# Patient Record
Sex: Female | Born: 1955 | Race: Black or African American | Hispanic: No | Smoking: Never smoker
Health system: Southern US, Community
[De-identification: ages and names within clinical notes are randomized; demographics above are authoritative.]

## PROBLEM LIST (undated history)

## (undated) DIAGNOSIS — T7840XA Allergy, unspecified, initial encounter: Secondary | ICD-10-CM

## (undated) DIAGNOSIS — H579 Unspecified disorder of eye and adnexa: Secondary | ICD-10-CM

## (undated) DIAGNOSIS — K219 Gastro-esophageal reflux disease without esophagitis: Secondary | ICD-10-CM

## (undated) DIAGNOSIS — IMO0002 Reserved for concepts with insufficient information to code with codable children: Secondary | ICD-10-CM

## (undated) DIAGNOSIS — I10 Essential (primary) hypertension: Secondary | ICD-10-CM

## (undated) DIAGNOSIS — D259 Leiomyoma of uterus, unspecified: Secondary | ICD-10-CM

## (undated) DIAGNOSIS — M329 Systemic lupus erythematosus, unspecified: Secondary | ICD-10-CM

## (undated) HISTORY — DX: Unspecified disorder of eye and adnexa: H57.9

## (undated) HISTORY — DX: Leiomyoma of uterus, unspecified: D25.9

## (undated) HISTORY — PX: BREAST CYST EXCISION: SHX579

## (undated) HISTORY — DX: Reserved for concepts with insufficient information to code with codable children: IMO0002

## (undated) HISTORY — PX: CHOLECYSTECTOMY: SHX55

## (undated) HISTORY — DX: Systemic lupus erythematosus, unspecified: M32.9

## (undated) HISTORY — PX: OTHER SURGICAL HISTORY: SHX169

## (undated) HISTORY — DX: Gastro-esophageal reflux disease without esophagitis: K21.9

## (undated) HISTORY — DX: Allergy, unspecified, initial encounter: T78.40XA

## (undated) HISTORY — DX: Essential (primary) hypertension: I10

---

## 2000-08-16 ENCOUNTER — Encounter: Admission: RE | Admit: 2000-08-16 | Discharge: 2000-08-16 | Payer: Self-pay | Admitting: Gynecology

## 2000-08-16 ENCOUNTER — Other Ambulatory Visit: Admission: RE | Admit: 2000-08-16 | Discharge: 2000-08-16 | Payer: Self-pay | Admitting: Gynecology

## 2000-08-16 ENCOUNTER — Encounter: Payer: Self-pay | Admitting: Gynecology

## 2000-08-22 DIAGNOSIS — D259 Leiomyoma of uterus, unspecified: Secondary | ICD-10-CM

## 2000-08-22 HISTORY — DX: Leiomyoma of uterus, unspecified: D25.9

## 2001-09-15 ENCOUNTER — Encounter: Payer: Self-pay | Admitting: Gynecology

## 2001-09-15 ENCOUNTER — Encounter: Admission: RE | Admit: 2001-09-15 | Discharge: 2001-09-15 | Payer: Self-pay | Admitting: Gynecology

## 2001-09-22 ENCOUNTER — Other Ambulatory Visit: Admission: RE | Admit: 2001-09-22 | Discharge: 2001-09-22 | Payer: Self-pay | Admitting: Gynecology

## 2002-01-20 HISTORY — PX: ENDOMETRIAL ABLATION: SHX621

## 2002-02-04 ENCOUNTER — Ambulatory Visit (HOSPITAL_BASED_OUTPATIENT_CLINIC_OR_DEPARTMENT_OTHER): Admission: RE | Admit: 2002-02-04 | Discharge: 2002-02-04 | Payer: Self-pay | Admitting: Gynecology

## 2002-02-04 ENCOUNTER — Encounter (INDEPENDENT_AMBULATORY_CARE_PROVIDER_SITE_OTHER): Payer: Self-pay | Admitting: Specialist

## 2003-03-17 ENCOUNTER — Other Ambulatory Visit: Admission: RE | Admit: 2003-03-17 | Discharge: 2003-03-17 | Payer: Self-pay | Admitting: Gynecology

## 2003-03-31 ENCOUNTER — Encounter: Payer: Self-pay | Admitting: Gynecology

## 2003-03-31 ENCOUNTER — Encounter: Admission: RE | Admit: 2003-03-31 | Discharge: 2003-03-31 | Payer: Self-pay | Admitting: Gynecology

## 2004-03-22 DIAGNOSIS — I10 Essential (primary) hypertension: Secondary | ICD-10-CM | POA: Insufficient documentation

## 2004-03-22 HISTORY — DX: Essential (primary) hypertension: I10

## 2004-04-10 ENCOUNTER — Other Ambulatory Visit: Admission: RE | Admit: 2004-04-10 | Discharge: 2004-04-10 | Payer: Self-pay | Admitting: Gynecology

## 2004-06-01 ENCOUNTER — Encounter: Admission: RE | Admit: 2004-06-01 | Discharge: 2004-06-01 | Payer: Self-pay | Admitting: Gynecology

## 2004-10-24 ENCOUNTER — Ambulatory Visit: Payer: Self-pay | Admitting: Family Medicine

## 2004-11-06 ENCOUNTER — Ambulatory Visit: Payer: Self-pay | Admitting: Family Medicine

## 2005-01-22 ENCOUNTER — Ambulatory Visit: Payer: Self-pay | Admitting: Family Medicine

## 2005-05-10 ENCOUNTER — Ambulatory Visit: Payer: Self-pay | Admitting: Family Medicine

## 2005-05-24 ENCOUNTER — Other Ambulatory Visit: Admission: RE | Admit: 2005-05-24 | Discharge: 2005-05-24 | Payer: Self-pay | Admitting: Gynecology

## 2005-10-23 ENCOUNTER — Ambulatory Visit: Payer: Self-pay | Admitting: Family Medicine

## 2005-11-14 ENCOUNTER — Ambulatory Visit: Payer: Self-pay | Admitting: Family Medicine

## 2006-01-14 ENCOUNTER — Ambulatory Visit: Payer: Self-pay | Admitting: Family Medicine

## 2006-05-08 ENCOUNTER — Encounter: Admission: RE | Admit: 2006-05-08 | Discharge: 2006-05-08 | Payer: Self-pay | Admitting: Gynecology

## 2006-05-27 ENCOUNTER — Other Ambulatory Visit: Admission: RE | Admit: 2006-05-27 | Discharge: 2006-05-27 | Payer: Self-pay | Admitting: Gynecology

## 2006-05-28 ENCOUNTER — Encounter: Admission: RE | Admit: 2006-05-28 | Discharge: 2006-05-28 | Payer: Self-pay | Admitting: Gynecology

## 2006-06-07 ENCOUNTER — Ambulatory Visit: Payer: Self-pay | Admitting: Family Medicine

## 2007-03-10 ENCOUNTER — Encounter: Payer: Self-pay | Admitting: Family Medicine

## 2007-07-04 ENCOUNTER — Encounter: Admission: RE | Admit: 2007-07-04 | Discharge: 2007-07-04 | Payer: Self-pay | Admitting: Gynecology

## 2007-07-09 ENCOUNTER — Encounter (INDEPENDENT_AMBULATORY_CARE_PROVIDER_SITE_OTHER): Payer: Self-pay | Admitting: *Deleted

## 2007-07-09 ENCOUNTER — Encounter: Payer: Self-pay | Admitting: Family Medicine

## 2007-07-18 ENCOUNTER — Encounter: Payer: Self-pay | Admitting: Family Medicine

## 2007-07-23 ENCOUNTER — Encounter: Payer: Self-pay | Admitting: Family Medicine

## 2007-07-23 LAB — CONVERTED CEMR LAB

## 2007-07-30 ENCOUNTER — Encounter: Payer: Self-pay | Admitting: Family Medicine

## 2007-09-24 ENCOUNTER — Ambulatory Visit: Payer: Self-pay | Admitting: Family Medicine

## 2007-09-24 DIAGNOSIS — J309 Allergic rhinitis, unspecified: Secondary | ICD-10-CM | POA: Insufficient documentation

## 2007-09-24 DIAGNOSIS — J069 Acute upper respiratory infection, unspecified: Secondary | ICD-10-CM | POA: Insufficient documentation

## 2007-09-24 DIAGNOSIS — R053 Chronic cough: Secondary | ICD-10-CM | POA: Insufficient documentation

## 2007-09-24 DIAGNOSIS — R058 Other specified cough: Secondary | ICD-10-CM | POA: Insufficient documentation

## 2007-09-29 ENCOUNTER — Telehealth (INDEPENDENT_AMBULATORY_CARE_PROVIDER_SITE_OTHER): Payer: Self-pay | Admitting: Internal Medicine

## 2007-10-06 ENCOUNTER — Encounter: Payer: Self-pay | Admitting: Family Medicine

## 2008-01-13 ENCOUNTER — Telehealth (INDEPENDENT_AMBULATORY_CARE_PROVIDER_SITE_OTHER): Payer: Self-pay | Admitting: Internal Medicine

## 2008-01-15 ENCOUNTER — Encounter (INDEPENDENT_AMBULATORY_CARE_PROVIDER_SITE_OTHER): Payer: Self-pay | Admitting: Internal Medicine

## 2008-01-19 ENCOUNTER — Encounter: Payer: Self-pay | Admitting: Family Medicine

## 2008-04-13 ENCOUNTER — Encounter (INDEPENDENT_AMBULATORY_CARE_PROVIDER_SITE_OTHER): Payer: Self-pay | Admitting: Internal Medicine

## 2008-04-14 ENCOUNTER — Encounter: Payer: Self-pay | Admitting: Family Medicine

## 2008-04-15 ENCOUNTER — Ambulatory Visit: Payer: Self-pay | Admitting: Family Medicine

## 2008-07-20 ENCOUNTER — Encounter: Admission: RE | Admit: 2008-07-20 | Discharge: 2008-07-20 | Payer: Self-pay | Admitting: Gynecology

## 2008-09-17 ENCOUNTER — Ambulatory Visit: Payer: Self-pay | Admitting: Family Medicine

## 2008-09-17 DIAGNOSIS — J019 Acute sinusitis, unspecified: Secondary | ICD-10-CM | POA: Insufficient documentation

## 2008-10-11 ENCOUNTER — Encounter: Payer: Self-pay | Admitting: Family Medicine

## 2008-10-11 ENCOUNTER — Telehealth (INDEPENDENT_AMBULATORY_CARE_PROVIDER_SITE_OTHER): Payer: Self-pay | Admitting: *Deleted

## 2008-11-09 ENCOUNTER — Ambulatory Visit: Payer: Self-pay | Admitting: Family Medicine

## 2008-11-09 DIAGNOSIS — M329 Systemic lupus erythematosus, unspecified: Secondary | ICD-10-CM | POA: Insufficient documentation

## 2009-01-17 ENCOUNTER — Encounter: Payer: Self-pay | Admitting: Family Medicine

## 2009-02-16 ENCOUNTER — Ambulatory Visit: Payer: Self-pay | Admitting: Family Medicine

## 2009-02-16 DIAGNOSIS — R42 Dizziness and giddiness: Secondary | ICD-10-CM | POA: Insufficient documentation

## 2009-04-06 ENCOUNTER — Encounter: Payer: Self-pay | Admitting: Family Medicine

## 2009-04-15 ENCOUNTER — Ambulatory Visit: Payer: Self-pay | Admitting: Family Medicine

## 2009-04-15 DIAGNOSIS — H409 Unspecified glaucoma: Secondary | ICD-10-CM | POA: Insufficient documentation

## 2009-07-22 ENCOUNTER — Ambulatory Visit: Payer: Self-pay | Admitting: Family Medicine

## 2009-08-08 ENCOUNTER — Encounter: Admission: RE | Admit: 2009-08-08 | Discharge: 2009-08-08 | Payer: Self-pay | Admitting: Gynecology

## 2009-08-09 ENCOUNTER — Encounter (INDEPENDENT_AMBULATORY_CARE_PROVIDER_SITE_OTHER): Payer: Self-pay | Admitting: *Deleted

## 2009-08-19 ENCOUNTER — Encounter: Payer: Self-pay | Admitting: Family Medicine

## 2009-08-30 ENCOUNTER — Ambulatory Visit: Payer: Self-pay | Admitting: Family Medicine

## 2009-08-30 DIAGNOSIS — H6120 Impacted cerumen, unspecified ear: Secondary | ICD-10-CM

## 2009-09-19 ENCOUNTER — Ambulatory Visit: Payer: Self-pay | Admitting: Family Medicine

## 2010-05-16 ENCOUNTER — Ambulatory Visit: Payer: Self-pay | Admitting: Family Medicine

## 2010-08-16 ENCOUNTER — Encounter: Admission: RE | Admit: 2010-08-16 | Discharge: 2010-08-16 | Payer: Self-pay | Admitting: Gynecology

## 2010-08-21 ENCOUNTER — Encounter: Payer: Self-pay | Admitting: Family Medicine

## 2010-08-25 ENCOUNTER — Encounter: Payer: Self-pay | Admitting: Family Medicine

## 2010-09-21 ENCOUNTER — Ambulatory Visit: Payer: Self-pay | Admitting: Family Medicine

## 2010-09-21 DIAGNOSIS — R05 Cough: Secondary | ICD-10-CM

## 2010-09-21 DIAGNOSIS — R059 Cough, unspecified: Secondary | ICD-10-CM | POA: Insufficient documentation

## 2010-11-12 ENCOUNTER — Encounter: Payer: Self-pay | Admitting: Gynecology

## 2010-11-13 ENCOUNTER — Encounter: Payer: Self-pay | Admitting: Gynecology

## 2010-11-23 NOTE — Letter (Signed)
Summary: Beather Arbour MD  Beather Arbour MD   Imported By: Lanelle Bal 09/06/2010 13:38:15  _____________________________________________________________________  External Attachment:    Type:   Image     Comment:   External Document

## 2010-11-23 NOTE — Letter (Signed)
Summary: Out of Work  [employer] at [facility]  [address] [place], [place] [address]   Phone: [phone]  Fax: [phone]    September 21, 2010   Employee:  Jennifer Rose    To Whom It May Concern:   For Medical reasons, please excuse the above named employee from work until cough resolved.  Potentially contagious.   If you need additional information, please feel free to contact our office.         Sincerely,    Crawford Givens MD

## 2010-11-23 NOTE — Letter (Signed)
Summary: Results Follow up Letter  [place] at [facility]  [address] [place], [place] [address]   Phone: [phone]  Fax: [phone]    08/21/2010 MRN: [id number]    Jennifer Rose [address] [place], [place]  [address]    Dear [name],  The following are the results of your recent test(s):  Test         Result    Pap Smear:        Normal _____  Not Normal _____ Comments: ______________________________________________________ Cholesterol: LDL(Bad cholesterol):         Your goal is less than:         HDL (Good cholesterol):       Your goal is more than: Comments:  ______________________________________________________ Mammogram:        Normal __X___  Not Normal _____ Comments:Repeat in one year.   ___________________________________________________________________ Hemoccult:        Normal _____  Not normal _______ Comments:    _____________________________________________________________________ Other Tests:    We routinely do not discuss normal results over the telephone.  If you desire a copy of the results, or you have any questions about this information we can discuss them at your next office visit.   Sincerely,    Idamae Schuller Tower,MD  MT/ri

## 2010-11-23 NOTE — Assessment & Plan Note (Signed)
Summary: BRONCHITIS/DLO   Vital Signs:  Patient profile:   55 year old female Height:      66 inches Weight:      223.25 pounds BMI:     36.16 Temp:     97.9 degrees F oral Pulse rate:   72 / minute Pulse rhythm:   regular BP sitting:   142 / 72  (left arm) Cuff size:   large  Vitals Entered By: Delilah Shan CMA Duncan Dull) (September 21, 2010 2:13 PM) CC: ? bronchitis   History of Present Illness: Sx started last week.  H/o bronchitis in the fall per patient.  Pain in a V across the chest today, worse with deep breath.  No fevers.  Inc cough at night.  Occ sputum, no ear pain.  Occ ST, no nasal pain but does have some rhinorrhea.  Occ crackling in ears with swallowing.    Allergies: No Known Drug Allergies  Past History:  Past Medical History: Hypertension (03/22/2004) Allergic rhinitis Lupus  GI- [facility]   Social History: Never Smoked Works at [employer].    Review of Systems       See HPI.  Otherwise negative.    Physical Exam  General:  GEN: nad, alert and oriented HEENT: mucous membranes moist, TM w/o erythema, nasal epithelium injected, OP with cobblestoning, L max sinus tender to palpation  NECK: supple w/o LA CV: rrr. PULM: ctab, no inc wob ABD: soft, +bs EXT: no edema    Impression & Recommendations:  Problem # 1:  COUGH (ICD-786.2) Lungs clear but given L max sinus tenderness and lupus will treat with amoxil for presumed sinusitis.  Flu shot encouraged when patient is well.  Supporitve tx in meantime o/w.  She understands.  Nontoxic.    Complete Medication List: 1)  Plaquenil 200 Mg Tabs (Hydroxychloroquine sulfate) .... Take 1 tablet by mouth two times a day 2)  Singulair 10 Mg Tabs (Montelukast sodium) .... Take 1 tablet by mouth at bedtime 3)  D 1000 Plus Tabs (Fa-cyanocobalamin-b6-d-ca) .... Take 2 tablets  by mouth once a day 4)  Amlodipine Besylate 5 Mg Tabs (Amlodipine besylate) .... Take one by mouth daily 5)  Nasacort Aq 55 Mcg/act Aers  (Triamcinolone acetonide(nasal)) .... 2 sprays each nostril daily as needed 6)  Meclizine Hcl 25 Mg Tabs (Meclizine hcl) .Marland Kitchen.. 1 every 6 hours  as needed for vertigo 7)  Advil 200 Mg Tabs (Ibuprofen) .... Otc as directed 8)  Xalatan 0.005 % Soln (Latanoprost) .... Gtt 1 both eyes--dr brewington 9)  Lisinopril 20 Mg Tabs (Lisinopril) .... Take 1 tablet by mouth once a day 10)  Aspirin 81 Mg Tabs (Aspirin) .... Take 1 tablet by mouth once a day 11)  Zithromax 250 Mg Tabs (Azithromycin) .... 2 by mouth today, 1 by mouth x4days.  Patient Instructions: 1)  Start the antibiotics today.   2)  Get plenty of rest, drink lots of clear liquids, and use Tylenol for fever and comfort.  3)  I would get a flu shot when you are feeling better.  Prescriptions: ZITHROMAX 250 MG TABS (AZITHROMYCIN) 2 by mouth today, 1 by mouth x4days.  #6 x 0   Entered and Authorized by:   Crawford Givens MD   Signed by:   Crawford Givens MD on 09/21/2010   Method used:   Electronically to        [facility]* (retail)       [address]       [place],  [place]  [address]       Ph: [phone]       Fax: [phone]   RxID:   [id number]    Orders Added: 1)  Est. Patient Level III [32[address]]    Current Allergies (reviewed today): No known allergies

## 2010-11-23 NOTE — Assessment & Plan Note (Signed)
Summary: ?SINUS INFECTION/CLE   Vital Signs:  Patient profile:   55 year old female Height:      66 inches Weight:      221 pounds BMI:     35.80 Temp:     98.2 degrees F oral Pulse rate:   76 / minute Pulse rhythm:   regular BP sitting:   120 / 66  (left arm) Cuff size:   large  Vitals Entered By: Linde Gillis CMA Duncan Dull) (May 16, 2010 1:48 PM) CC: ? sinus infection   History of Present Illness: thinks she has a sinus infection waking up with headache this week sat am - scratchy and sore throat with lot of yellow drainage and phlegm  stomach mildly upset  r side of face and ear hurt now  a little bit of vertigo  nose is congested   mucous is pale yellow  no fever today no chills or aches   is on plaquenil     Allergies (verified): No Known Drug Allergies  Past History:  Past Medical History: Last updated: 02/12/2009 Hypertension (03/22/2004) Allergic rhinitis   GI- [id number]   Past Surgical History: Last updated: 02/12/2009 ccy uterine fibroids per gyn 11/01 endometrial ablation polyps/DUB 4/03 DEXA nml 5/08 3/10 colonoscopy nl  Social History: Last updated: 09/17/2008 Never Smoked  Risk Factors: Smoking Status: never (09/17/2008)  Review of Systems General:  Complains of fatigue; denies chills and fever. Eyes:  Denies blurring and eye irritation. ENT:  Complains of earache, nasal congestion, postnasal drainage, sinus pressure, and sore throat; denies ear discharge. CV:  Denies chest pain or discomfort and palpitations. Resp:  Complains of cough; denies pleuritic, shortness of breath, sputum productive, and wheezing. GI:  Denies nausea and vomiting. MS:  Complains of joint pain; joint pain from lupus is fairly well controlled . Derm:  Denies itching, lesion(s), poor wound healing, and rash.  Physical Exam  General:  overweight but generally well appearing  Head:  normocephalic, atraumatic, and no abnormalities observed.  L maxillary and  ethmoid, and R ethmoid sinus tenderness Eyes:  vision grossly intact, pupils equal, pupils round, pupils reactive to light, and no injection.   Ears:  R ear normal and L ear normal.   Nose:  nares are injected and moderately congested  Mouth:  pharynx pink and moist.   Neck:  supple with full rom and no masses or thyromegally, no JVD or carotid bruit  Chest Wall:  No deformities, masses, or tenderness noted. Lungs:  normal respiratory effort, no intercostal retractions, and no accessory muscle use.   Heart:  Normal rate and regular rhythm. S1 and S2 normal without gallop, murmur, click, rub or other extra sounds. Skin:  Intact without suspicious lesions or rashes Cervical Nodes:  No lymphadenopathy noted Psych:  normal affect, talkative and pleasant    Impression & Recommendations:  Problem # 1:  SINUSITIS - ACUTE-NOS (ICD-461.9) Assessment New  with R sided facial pain and congestion - pt is immunocomprimised with plaquenil as well  tx with augmentin  recommend sympt care- see pt instructions   pt advised to update me if symptoms worsen or do not improve - esp if more facial pain (also warned to watch out for any rash )  Her updated medication list for this problem includes:    Nasacort Aq 55 Mcg/act Aers (Triamcinolone acetonide(nasal)) .Marland Kitchen... 2 sprays each nostril daily    Augmentin 875-125 Mg Tabs (Amoxicillin-pot clavulanate) .Marland Kitchen... 1 by mouth two times a day for 10 days  for sinus infection  Orders: Prescription Created Electronically [phone])  Complete Medication List: 1)  Lisinopril 10 Mg Tabs (Lisinopril) .... One by mouth once daily 2)  Plaquenil 200 Mg Tabs (Hydroxychloroquine sulfate) .... Take 1 tablet by mouth two times a day 3)  Singulair 10 Mg Tabs (Montelukast sodium) .... Take 1 tablet by mouth at bedtime 4)  D 1000 Plus Tabs (Fa-cyanocobalamin-b6-d-ca) .... Take 2 tablets  by mouth once a day 5)  Amlodipine Besylate 5 Mg Tabs (Amlodipine besylate) .... Take one by  mouth daily 6)  Nasacort Aq 55 Mcg/act Aers (Triamcinolone acetonide(nasal)) .... 2 sprays each nostril daily 7)  Meclizine Hcl 25 Mg Tabs (Meclizine hcl) .Marland Kitchen.. 1 every 6 hours  as needed for vertigo 8)  Advil 200 Mg Tabs (Ibuprofen) .... Otc as directed 9)  Xalatan 0.005 % Soln (Latanoprost) .... Gtt 1 both eyes--dr brewington 10)  Augmentin 875-125 Mg Tabs (Amoxicillin-pot clavulanate) .Marland Kitchen.. 1 by mouth two times a day for 10 days for sinus infection  Patient Instructions: 1)  take augmentin as directed  2)  you can try mucinex over the counter twice daily as directed and nasal saline spray for congestion 3)  tylenol over the counter as directed may help with aches, headache and fever 4)  call if symptoms worsen or if not improved in 4-5 days  5)  also watch for a rash and keep me updated  Prescriptions: AUGMENTIN 875-125 MG TABS (AMOXICILLIN-POT CLAVULANATE) 1 by mouth two times a day for 10 days for sinus infection  #20 x 0   Entered and Authorized by:   Judith Part MD   Signed by:   Judith Part MD on 05/16/2010   Method used:   Electronically to        [facility]* (retail)       [address]       [place], [place]  [address]       Ph: [phone]       Fax: [phone]   RxID:   [id number]   Current Allergies (reviewed today): No known allergies

## 2011-03-09 NOTE — Op Note (Signed)
[facility]  Patient:    Jennifer Rose, Jennifer Rose Visit Number: [id number] MRN: [id number]          Service Type: NES Location: NESC Attending Physician:  Katrina Stack Dictated by:   Gretta Cool, M.D. Proc. Date: 02/04/02 Admit Date:  02/04/2002   CC:         Roxy Manns, M.D. [facility]   Operative Report  PREOPERATIVE DIAGNOSIS:  Endometrial polyps with abnormal uterine bleeding.  POSTOPERATIVE DIAGNOSIS:  Endometriosis polyps with abnormal uterine bleeding with multiple polyps identified.  SURGEON:  Gretta Cool, M.D.  ANESTHESIA:  IV sedation and paracervical block with 1% Xylocaine.  DESCRIPTION OF PROCEDURE:  Under excellent IV sedation with the patient prepped and draped in the lithotomy position, the cervix was grasped with a single-tooth tenaculum and pulled down into view.  He was then progressively dilated with a series of Pratt dilators to accommodate the 7 mm resectoscope. The endometrial cavity was then systematically photographed.  It was then resected to remove all of the polyps.  There were two very large ones on the anterior wall and others on the lateral walls.  Once the entire endometrial cavity had been resected down to myometrium, attention was turned to the endometrial resection.  The entire cavity was resected as far as possible. The anterior wall was very difficult to visualize because bubble formation and difficulty with visualization was because of the presence of bubbles. Eventually the entire endometrial cavity was felt to have been completely resected.  At this point the VaporTrode electrode was applied and the entire cavity once again treated with the VaporTrode at 200 watt setting pure cut. At this point with reduced pressure there was minimal bleeding.  Those bleeding points were treated with cautery.  At this point, the procedure was terminated without complication and the patient returned to the recovery room in  excellent condition. Dictated by:   Gretta Cool, M.D. Attending Physician:  Katrina Stack DD:  02/04/02 TD:  02/04/02 Job: [id number]

## 2011-05-28 ENCOUNTER — Encounter: Payer: Self-pay | Admitting: Family Medicine

## 2011-05-28 ENCOUNTER — Other Ambulatory Visit: Payer: Self-pay | Admitting: Family Medicine

## 2011-05-28 ENCOUNTER — Ambulatory Visit (INDEPENDENT_AMBULATORY_CARE_PROVIDER_SITE_OTHER): Payer: BC Managed Care – PPO | Admitting: Family Medicine

## 2011-05-28 VITALS — BP 140/76 | HR 76 | Temp 98.1°F | Ht 66.0 in | Wt 223.5 lb

## 2011-05-28 DIAGNOSIS — R222 Localized swelling, mass and lump, trunk: Secondary | ICD-10-CM

## 2011-05-28 NOTE — Patient Instructions (Signed)
If area over breast becomes painful or bigger let me know  We will refer you for ultrasound at check out

## 2011-05-28 NOTE — Progress Notes (Signed)
  Subjective:    Patient ID: Jennifer Rose, female    DOB: 12-01-1955, 55 y.o.   MRN: [id number]  HPI Here for visit for chest swelling  For the past 2 weeks noticed puffiness over L chest / shoulder -- skin Thought it was coming from her lupus ?  No breast lumps mammo was normal   Did help her girls move - some lifting  No pain, however    Sees rheum in dec -no problems  Last pred was in fall of 2011   On plaquenil - doing very well    bp stable  Wt stable   Patient Active Problem List  Diagnoses  . GLAUCOMA  . CERUMEN IMPACTION, LEFT  . HYPERTENSION  . Acute Sinusitis, Unspecified  . Allergic Rhinitis, Cause Unspecified  . LUPUS  . VERTIGO  . Swelling, mass, or lump in chest   Past Medical History  Diagnosis Date  . Hypertension 03/22/2004  . Allergy     allergic rhinitis  . Lupus    Past Surgical History  Procedure Date  . Ccy   . Endometrial ablation 04/03    polyps?DUB   History  Substance Use Topics  . Smoking status: Never Smoker   . Smokeless tobacco: Not on file  . Alcohol Use:    No family history on file. No Known Allergies No current outpatient prescriptions on file prior to visit.         Review of Systems Review of Systems  Constitutional: Negative for fever, appetite change, fatigue and unexpected weight change.  Eyes: Negative for pain and visual disturbance.  Respiratory: Negative for cough and shortness of breath.   Cardiovascular: Negative. For cp or sob or palpitations  Gastrointestinal: Negative for nausea, diarrhea and constipation.  Genitourinary: Negative for urgency and frequency.  Skin: Negative for pallor. no rash  Neurological: Negative for weakness, light-headedness, numbness and headaches.  Hematological: Negative for adenopathy. Does not bruise/bleed easily.  Psychiatric/Behavioral: Negative for dysphoric mood. The patient is not nervous/anxious.          Objective:   Physical Exam  Constitutional: She  appears well-developed and well-nourished. No distress.       overwt and well appearing   HENT:  Head: Normocephalic and atraumatic.  Mouth/Throat: Oropharynx is clear and moist.  Eyes: Conjunctivae and EOM are normal. Pupils are equal, round, and reactive to light.  Neck: Normal range of motion. Neck supple. No JVD present. No thyromegaly present.  Cardiovascular: Normal rate, regular rhythm, normal heart sounds and intact distal pulses.   Pulmonary/Chest: Effort normal and breath sounds normal. No respiratory distress. She has no wheezes.  Abdominal: Soft. Bowel sounds are normal. She exhibits no distension. There is no tenderness.  Genitourinary: No breast swelling, tenderness, discharge or bleeding.       2 by 2 cm soft lump / fullness on L anterior chest wall at top of breast  Non tender [place]  Consistency resembles lipoma  Nl breast exam otherwise   Musculoskeletal: Normal range of motion. She exhibits no edema and no tenderness.  Lymphadenopathy:    She has no cervical adenopathy.  Neurological: She is alert. She has normal reflexes.  Skin: Skin is warm and dry. No rash noted. No erythema. No pallor.  Psychiatric: She has a normal mood and affect.          Assessment & Plan:

## 2011-05-28 NOTE — Assessment & Plan Note (Signed)
This is vague 2 by 2 cm area of fullness that is soft and resembles lipoma Pt has fibrocystic breasts with nl mam  [place] of breast and cw and then update  Suspect plan will be observation since pt is not symptomatic

## 2011-06-01 ENCOUNTER — Ambulatory Visit
Admission: RE | Admit: 2011-06-01 | Discharge: 2011-06-01 | Disposition: A | Discharge status: Home / Self Care | Payer: BC Managed Care – PPO | Source: Ambulatory Visit | Attending: Family Medicine | Admitting: Family Medicine

## 2011-06-01 ENCOUNTER — Other Ambulatory Visit: Payer: Self-pay | Admitting: Family Medicine

## 2011-06-01 DIAGNOSIS — R222 Localized swelling, mass and lump, trunk: Secondary | ICD-10-CM

## 2011-09-04 ENCOUNTER — Encounter: Payer: Self-pay | Admitting: Family Medicine

## 2011-09-04 ENCOUNTER — Ambulatory Visit (INDEPENDENT_AMBULATORY_CARE_PROVIDER_SITE_OTHER): Payer: BC Managed Care – PPO | Admitting: Family Medicine

## 2011-09-04 DIAGNOSIS — J019 Acute sinusitis, unspecified: Secondary | ICD-10-CM | POA: Insufficient documentation

## 2011-09-04 MED ORDER — AMOXICILLIN-POT CLAVULANATE 875-125 MG PO TABS: 1.0000 | ORAL_TABLET | Freq: Two times a day (BID) | ORAL | Status: AC

## 2011-09-04 NOTE — Progress Notes (Signed)
Subjective:    Patient ID: Jennifer Rose, female    DOB: Jan 16, 1956, 55 y.o.   MRN: [id number]  HPI Here for uri symptoms and vertigo and suspected sinusitis  Sick now for 3 weeks  Post nasal drip turned into full blown congestion  Dizziness for 3 days- worse today -- is very dizzy with spinning sensation if she gets up or turns her head  Mucous - is colored  One episode of vomiting  Face is hurting under her eyes  ? Fever yesterday- a bit of achey   Using meclizine - she has at home and it is helping  Patient Active Problem List  Diagnoses  . GLAUCOMA  . CERUMEN IMPACTION, LEFT  . HYPERTENSION  . Allergic Rhinitis, Cause Unspecified  . LUPUS  . VERTIGO  . Swelling, mass, or lump in chest  . Acute sinusitis   Past Medical History  Diagnosis Date  . Hypertension 03/22/2004  . Allergy     allergic rhinitis  . Lupus    Past Surgical History  Procedure Date  . Ccy   . Endometrial ablation 04/03    polyps?DUB   History  Substance Use Topics  . Smoking status: Never Smoker   . Smokeless tobacco: Not on file  . Alcohol Use:    No family history on file. No Known Allergies Current Outpatient Prescriptions on File Prior to Visit  Medication Sig Dispense Refill  . amLODipine (NORVASC) 5 MG tablet Take 5 mg by mouth daily.        Marland Kitchen aspirin 81 MG tablet Take 81 mg by mouth daily.        . cholecalciferol (VITAMIN D) 1000 UNITS tablet Take 2,000 Units by mouth daily.        . hydroxychloroquine (PLAQUENIL) 200 MG tablet Take 200 mg by mouth 2 (two) times daily.        Marland Kitchen latanoprost (XALATAN) 0.005 % ophthalmic solution Place 1 drop into both eyes as directed.        Marland Kitchen lisinopril (PRINIVIL,ZESTRIL) 20 MG tablet Take 20 mg by mouth daily.        . montelukast (SINGULAIR) 10 MG tablet Take 10 mg by mouth at bedtime.        . triamcinolone (NASACORT AQ) 55 MCG/ACT nasal inhaler Place 2 sprays into the nose daily as needed.           Review of Systems Review of Systems    Constitutional: Negative for fever, appetite change, fatigue and unexpected weight change.  Eyes: Negative for pain and visual disturbance.  ENT pos for facial pain/ congestion/ neg for ear or throat pain  Respiratory: Negative for cough and shortness of breath.  (had cough which is better)  Cardiovascular: Negative for cp or palpitations    Gastrointestinal: Negative for nausea, diarrhea and constipation.  Genitourinary: Negative for urgency and frequency.  Skin: Negative for pallor or rash   Neurological: Negative for weakness, numbness, pos for dizzy/ light headed  Hematological: Negative for adenopathy. Does not bruise/bleed easily.  Psychiatric/Behavioral: Negative for dysphoric mood. The patient is not nervous/anxious.          Objective:   Physical Exam  Constitutional: She is oriented to person, place, and time. She appears well-developed and well-nourished. No distress.       overwt and well appearing  Is fatigued   HENT:  Head: Normocephalic and atraumatic.  Right Ear: External ear normal.  Left Ear: External ear normal.  Nares are injected and congested  Tms dull  Maxillary sinus tenderness bilat  Post nasal drip  Eyes: Conjunctivae and EOM are normal. Pupils are equal, round, and reactive to light.       2-3 beats of horiz nystagmus bilat  Neck: Normal range of motion. Neck supple.  Cardiovascular: Normal rate, regular rhythm and normal heart sounds.   Pulmonary/Chest: Effort normal and breath sounds normal. No respiratory distress. She has no wheezes.  Lymphadenopathy:    She has no cervical adenopathy.  Neurological: She is alert and oriented to person, place, and time. She has normal reflexes. No cranial nerve deficit. She exhibits normal muscle tone. Coordination normal.       A little unstable on rhomberg  Skin: Skin is warm and dry. No rash noted. No erythema. No pallor.  Psychiatric: She has a normal mood and affect.          Assessment & Plan:

## 2011-09-04 NOTE — Assessment & Plan Note (Signed)
Suspect bacterial due to length of illness/ facial pain / malaise and purulent discharge Suspect vertigo due to congestion and ETD tx with augmentin  inst to update if worse or not improved  Also nasal saline spray and rest  Meclizine with caution for dizziness

## 2011-09-04 NOTE — Patient Instructions (Signed)
Drink fluids  Take meclizine until dizziness subsides  Take augmentin for sinus infection  If worse or not improving in a week- please call  Use some nasal saline for congestion

## 2011-09-06 ENCOUNTER — Encounter: Payer: Self-pay | Admitting: Family Medicine

## 2011-09-07 ENCOUNTER — Ambulatory Visit: Payer: BC Managed Care – PPO | Admitting: Family Medicine

## 2011-10-02 ENCOUNTER — Other Ambulatory Visit: Payer: Self-pay | Admitting: Gynecology

## 2011-10-03 ENCOUNTER — Telehealth: Payer: Self-pay

## 2011-10-03 NOTE — Telephone Encounter (Signed)
Pt saw Dr Nicholas Lose and BP was elevated; pt stated 150/80. Dr Nicholas Lose told pt to stop lisinopril and gave samples for 1 week for pt to take Benicar 40 mg taking 1/2 tablet by mouth daily. Dr Nicholas Lose wanted pt to call Dr Milinda Antis to let her know. Pt wants to know if Dr Milinda Antis wants to see her or what to do? Pt can be reached today until 4 pm at [phone]; then home # [phone] or cell [phone].Please advise. (I called Dr Nicholas Lose office and note has not been transcribed yet but will fax to [phone] desk) this afternoon or tomorrow at the latest.)

## 2011-10-03 NOTE — Telephone Encounter (Signed)
Left vm for pt to callback 

## 2011-10-03 NOTE — Telephone Encounter (Signed)
Thanks for the update - follow up with me in about 2-4 weeks

## 2011-10-04 NOTE — Telephone Encounter (Signed)
Left /m for pt to call back.

## 2011-10-05 NOTE — Telephone Encounter (Signed)
Patient notified as instructed by telephone. Pt scheduled f/u with Dr Milinda Antis 10/31/11 at 8am. Dr Milinda Antis the notes from Dr Nicholas Lose just came over fax and are on your shelf for review.

## 2011-10-31 ENCOUNTER — Ambulatory Visit: Payer: BC Managed Care – PPO | Admitting: Family Medicine

## 2012-01-02 ENCOUNTER — Telehealth: Payer: Self-pay | Admitting: Family Medicine

## 2012-01-02 NOTE — Telephone Encounter (Signed)
Triage Record Num: [id number] Operator: Chevis Pretty Patient Name: Jennifer Rose Call Date & Time: 01/02/2012 9:31:47AM Patient Phone: [phone] PCP: Patient Gender: Female PCP Fax : Patient DOB: 01-04-1956 Practice Name: [facility] Reason for Call: Caller: Jennifer Rose/Patient; PCP: Jennifer Manns A.; CB#: [phone]; ; ; Call regarding Sore Throat, Congestion, Earache; afebrile. Symptoms onset 12/31/11 but not improving. Per protocol, emergent symptoms denied; advised appt within 24 hours. Per Epic, no appts available; TC to office for assistance. Appt sched 0800 01/03/12 per staff. Home care advised for in the meantime, with callback parameters given. Protocol(s) Used: Upper Respiratory Infection (URI) Recommended Outcome per Protocol: See Provider within 24 hours Reason for Outcome: Mild to moderate headache for more than 24 hours unrelieved with nonprescription medications Care Advice: ~ Use a cool mist humidifier to moisten air. Be sure to clean according to manufacturer's instructions. Call provider if headache worsens, develops temperature greater than 101.39F (38.1C) or any temperature elevation in a geriatric or immunocompromised patient (such as diabetes, HIV/AIDS, chemotherapy, organ transplant, or chronic steroid use). ~ ~ SYMPTOM / CONDITION MANAGEMENT ~ CAUTIONS A warm, moist compress placed on face, over eyes for 15 to 20 minutes, 5 to 6 times a day, may help relieve the congestion. ~ Most adults need to drink 6-10 eight-ounce glasses (1.2-2.0 liters) of fluids per day unless previously told to limit fluid intake for other medical reasons. Limit fluids that contain caffeine, sugar or alcohol. Urine will be a very light yellow color when you drink enough fluids. ~ Analgesic/Antipyretic Advice - Acetaminophen: Consider acetaminophen as directed on label or by pharmacist/provider for pain or fever. PRECAUTIONS: - Use only if there is no history of liver disease,  alcoholism, or intake of three or more alcohol drinks per day. - If approved by provider when breastfeeding. - Do not exceed recommended dose or frequency. ~ Analgesic/Antipyretic Advice - NSAIDs: Consider aspirin, ibuprofen, naproxen or ketoprofen for pain or fever as directed on label or by pharmacist/provider. PRECAUTIONS: - If over 46 years of age, should not take longer than 1 week without consulting provider. EXCEPTIONS: - Should not be used if taking blood thinners or have bleeding problems. - Do not use if have history of sensitivity/allergy to any of these medications; or history of cardiovascular, ulcer, kidney, liver disease or diabetes unless approved by provider. - Do not exceed recommended dose or frequency. ~ 01/02/2012 9:49:52AM Page 1 of 1 [id number]

## 2012-01-02 NOTE — Telephone Encounter (Signed)
Has appt. tomorrow

## 2012-01-03 ENCOUNTER — Encounter: Payer: Self-pay | Admitting: Family Medicine

## 2012-01-03 ENCOUNTER — Ambulatory Visit (INDEPENDENT_AMBULATORY_CARE_PROVIDER_SITE_OTHER): Payer: BC Managed Care – PPO | Admitting: Family Medicine

## 2012-01-03 VITALS — BP 130/78 | HR 64 | Temp 97.6°F | Wt 225.0 lb

## 2012-01-03 DIAGNOSIS — H6690 Otitis media, unspecified, unspecified ear: Secondary | ICD-10-CM

## 2012-01-03 MED ORDER — AMOXICILLIN-POT CLAVULANATE 875-125 MG PO TABS: 1.0000 | ORAL_TABLET | Freq: Two times a day (BID) | ORAL | Status: AC

## 2012-01-03 MED ORDER — FLUCONAZOLE 150 MG PO TABS: 150.0000 mg | ORAL_TABLET | Freq: Once | ORAL | Status: AC

## 2012-01-03 NOTE — Patient Instructions (Signed)
Take Augmentin as directed- 1 tablet twice daily for 10 days.   Drink lots of fluids.  Treat sympotmatically with Mucinex, nasal saline irrigation, and Tylenol/Ibuprofen.  Call if not improving as expected in 5-7 days.

## 2012-01-03 NOTE — Progress Notes (Signed)
SUBJECTIVE:  Jennifer Rose is a 56 y.o. female who complains of coryza, congestion, sneezing, sore throat and bilateral ear pain for 3 days. She denies a history of anorexia, chest pain, chills, dizziness, fatigue and fevers and denies a history of asthma. Patient denies smoke cigarettes.   Patient Active Problem List  Diagnoses  . GLAUCOMA  . CERUMEN IMPACTION, LEFT  . HYPERTENSION  . Allergic Rhinitis, Cause Unspecified  . LUPUS  . VERTIGO  . Swelling, mass, or lump in chest  . Acute sinusitis   Past Medical History  Diagnosis Date  . Hypertension 03/22/2004  . Allergy     allergic rhinitis  . Lupus   . Uterine fibroid 11/01    per GYN   Past Surgical History  Procedure Date  . Ccy   . Endometrial ablation 04/03    polyps?DUB   History  Substance Use Topics  . Smoking status: Never Smoker   . Smokeless tobacco: Not on file  . Alcohol Use:    No family history on file. No Known Allergies Current Outpatient Prescriptions on File Prior to Visit  Medication Sig Dispense Refill  . amLODipine (NORVASC) 5 MG tablet Take 5 mg by mouth daily.        Marland Kitchen aspirin 81 MG tablet Take 81 mg by mouth daily.        . cholecalciferol (VITAMIN D) 1000 UNITS tablet Take 2,000 Units by mouth daily.        . hydroxychloroquine (PLAQUENIL) 200 MG tablet Take 200 mg by mouth 2 (two) times daily.        Marland Kitchen latanoprost (XALATAN) 0.005 % ophthalmic solution Place 1 drop into both eyes as directed.        Marland Kitchen lisinopril (PRINIVIL,ZESTRIL) 20 MG tablet Take 20 mg by mouth daily.        . meclizine (ANTIVERT) 12.5 MG tablet Take 12.5 mg by mouth 3 (three) times daily as needed.        . montelukast (SINGULAIR) 10 MG tablet Take 10 mg by mouth at bedtime.        . triamcinolone (NASACORT AQ) 55 MCG/ACT nasal inhaler Place 2 sprays into the nose daily as needed.         The PMH, PSH, Social History, Family History, Medications, and allergies have been reviewed in [facility], and have been updated if  relevant.  OBJECTIVE: BP 130/78  Pulse 64  Temp(Src) 97.6 F (36.4 C) (Oral)  Wt 225 lb (102.059 kg)  She appears well, vital signs are as noted. TMs dull bilaterally, right with red tinged drainage but no large defect in TM visible.  Throat and pharynx normal.  Neck supple. No adenopathy in the neck. Nose is congested. Sinuses non tender. The chest is clear, without wheezes or rales.  ASSESSMENT:  serous otitis with probable TM rupture (small, not visible).  PLAN: Augmentin 875 mg twice daily x 10 days. Symptomatic therapy suggested: push fluids, rest and return office visit prn if symptoms persist or worsen. . Call or return to clinic prn if these symptoms worsen or fail to improve as anticipated.

## 2012-01-14 ENCOUNTER — Telehealth: Payer: Self-pay

## 2012-01-14 NOTE — Telephone Encounter (Signed)
Most conjunctivitis is viral - could be assoc with the ear infx Alert me if vision problems or pain  Use warm wet washcloth to wipe away the discharge/ practice good handwashing  If not imp in 1-2 d, f/u (or if new symptoms)

## 2012-01-14 NOTE — Telephone Encounter (Signed)
Left message on cell phone voicemail advising patient as instructed.   

## 2012-01-14 NOTE — Telephone Encounter (Signed)
Pt said this morning had goop in her right eye. For couple of days rt eye has been watery and red. Today the goop in her rt eye appears yellow. No itching but rt eye is still red. Pt said recently she saw Dr Dayton Martes for ear infection but that has cleared up. Pt uses [facility] and can be reached on cell [phone].

## 2012-05-20 ENCOUNTER — Other Ambulatory Visit: Payer: Self-pay | Admitting: Gynecology

## 2012-05-20 DIAGNOSIS — Z1231 Encounter for screening mammogram for malignant neoplasm of breast: Secondary | ICD-10-CM

## 2012-07-23 ENCOUNTER — Ambulatory Visit: Payer: BC Managed Care – PPO

## 2012-07-30 ENCOUNTER — Ambulatory Visit: Payer: BC Managed Care – PPO

## 2012-09-05 ENCOUNTER — Ambulatory Visit
Admission: RE | Admit: 2012-09-05 | Discharge: 2012-09-05 | Disposition: A | Discharge status: Home / Self Care | Payer: BC Managed Care – PPO | Source: Ambulatory Visit | Attending: Gynecology | Admitting: Gynecology

## 2012-09-05 DIAGNOSIS — Z1231 Encounter for screening mammogram for malignant neoplasm of breast: Secondary | ICD-10-CM

## 2012-10-28 ENCOUNTER — Other Ambulatory Visit: Payer: Self-pay | Admitting: Gynecology

## 2012-10-31 ENCOUNTER — Ambulatory Visit: Payer: BC Managed Care – PPO | Admitting: Family Medicine

## 2012-11-20 ENCOUNTER — Encounter: Payer: Self-pay | Admitting: Family Medicine

## 2012-11-20 ENCOUNTER — Ambulatory Visit (INDEPENDENT_AMBULATORY_CARE_PROVIDER_SITE_OTHER): Payer: BC Managed Care – PPO | Admitting: Family Medicine

## 2012-11-20 VITALS — BP 132/80 | HR 69 | Temp 98.6°F | Wt 227.8 lb

## 2012-11-20 DIAGNOSIS — J019 Acute sinusitis, unspecified: Secondary | ICD-10-CM

## 2012-11-20 MED ORDER — AMOXICILLIN-POT CLAVULANATE 875-125 MG PO TABS: 1.0000 | ORAL_TABLET | Freq: Two times a day (BID) | ORAL | Status: DC

## 2012-11-20 MED ORDER — FLUCONAZOLE 150 MG PO TABS: 150.0000 mg | ORAL_TABLET | Freq: Once | ORAL | Status: DC

## 2012-11-20 NOTE — Assessment & Plan Note (Signed)
Nontoxic, would treat given the duration.  Use nasal saline and f/u prn.  Can use diflucan if needed after the abx.

## 2012-11-20 NOTE — Progress Notes (Signed)
Sx started then increased in the last week. Dizzy on standing (not presyncopal).  Taking meclizine.  Facial pain.  No fevers.  No cough.  Mild ST, episodic. B ear ringing.  Discolored rhinorrhea.  Stuffy.  On plaquenil.   ROS: See HPI.  Otherwise negative.    Meds, vitals, and allergies reviewed.   GEN: nad, alert and oriented HEENT: mucous membranes moist, TM w/o erythema, nasal epithelium injected, OP with cobblestoning, sinuses ttp x4 NECK: supple w/o LA CV: rrr. PULM: ctab, no inc wob ABD: soft, +bs EXT: no edema

## 2012-11-20 NOTE — Patient Instructions (Addendum)
Start the antibiotics today, use nasal saline frequently and this should improve.  Take care.

## 2013-03-17 ENCOUNTER — Telehealth: Payer: Self-pay

## 2013-03-17 NOTE — Telephone Encounter (Signed)
[facility] faxed prior auth Benicar HCT 40-12.5 mg taking one tab by mouth daily. Unable to reach pt by phone. Spoke with [place] at [place]; pt has been getting samples of Benicar HCT from Dr Nicholas Lose. Benicar HCT is not on pts med list. [facility] put note on PA that the following meds are approved by pts insurance; 1)Losartan/HCT 2)Telmisartan /HCT and Valsartan/HCT. Prior auth in Dr Sharen Hones in box for review.

## 2013-03-20 MED ORDER — LOSARTAN POTASSIUM-HCTZ 50-12.5 MG PO TABS: 1.0000 | ORAL_TABLET | Freq: Every day | ORAL | Status: DC

## 2013-03-20 NOTE — Telephone Encounter (Signed)
Reviewed phone note from 10/03/2011 where Dr. Nicholas Lose stopped lisinopril (due to elevated blood pressures) and started benicar.  Pt never followed up as planned.   Now receiving PA request for benicar HCT 40/12.5mg  daily. Doesn't seem like she's tried other ARBs - will recommend trial of losartan HCT - sent in.   She has not seen PCP since 08/2011. plz notify patient and ask her to schedule f/u appt with PCP in next few weeks to discuss HTN regimen. No results found for this basename: CREATININE

## 2013-03-23 ENCOUNTER — Ambulatory Visit (INDEPENDENT_AMBULATORY_CARE_PROVIDER_SITE_OTHER): Payer: BC Managed Care – PPO | Admitting: Family Medicine

## 2013-03-23 ENCOUNTER — Encounter: Payer: Self-pay | Admitting: Family Medicine

## 2013-03-23 VITALS — BP 128/76 | HR 70 | Temp 98.1°F | Ht 66.0 in | Wt 222.5 lb

## 2013-03-23 DIAGNOSIS — J019 Acute sinusitis, unspecified: Secondary | ICD-10-CM | POA: Insufficient documentation

## 2013-03-23 DIAGNOSIS — G43909 Migraine, unspecified, not intractable, without status migrainosus: Secondary | ICD-10-CM

## 2013-03-23 MED ORDER — AMOXICILLIN-POT CLAVULANATE 875-125 MG PO TABS: 1.0000 | ORAL_TABLET | Freq: Two times a day (BID) | ORAL | Status: DC

## 2013-03-23 NOTE — Assessment & Plan Note (Signed)
Less frequent as she ages- but worsened by sinus infx now  Will tx that  If no imp at that time consider toradol inj or tryptan  Disc lifestyle habits- pt is knowlegable

## 2013-03-23 NOTE — Assessment & Plan Note (Signed)
With R sided maxillary tenderness and congestion - this is perpetuating migraine tx with augmentin Better allergy control- zyrtec Update if not imp

## 2013-03-23 NOTE — Patient Instructions (Addendum)
I think a sinus infection is keeping your headache going  Take the augmentin as directed Continue nasal spray Change to zyrtec 10 mg once daily for antihistamine  Drink fluids and avoid caffeine Ibuprofen is ok for headache Update if not starting to improve in a week or if worsening

## 2013-03-23 NOTE — Progress Notes (Signed)
Subjective:    Patient ID: Jennifer Rose, female    DOB: 1956/03/12, 57 y.o.   MRN: [id number]  HPI Here for headaches  For past 2 weeks - nagging headaches One bad migraine last sat night   Headache is on the R side lately- around the eye and under the eye  A nagging ache  Does have opthy appt coming up (has lupus) advil does help  Some allergy symptoms - wakes up with nasal congestion Clear nasal drainage and some sneezing - on nasacort aq and also an antihistamine otc (? What med)   No fever  No cough -not much  Pressing below eye - is a bit tender   Her migraine had a visual aura   She has been to the headache clinic at [facility] - was helpful  Has not tried tryptans  1  Cup of coffee per day and then decaf tea   Had some light sensitivity with her migraine  Some now  A little nausea    Wt is down 5 lb with bmi of 35 bp is stable today  No cp or palpitations or headaches or edema  No side effects to medicines  Dr Nicholas Lose changed bp medicine (hyzaar caused cough as well as lisinopril) Also walking a bit more - happy about that  BP Readings from Last 3 Encounters:  03/23/13 128/76  11/20/12 132/80  01/03/12 130/78      Patient Active Problem List   Diagnosis Date Noted  . Acute sinusitis 03/23/2013  . Migraine headache 03/23/2013  . Swelling, mass, or lump in chest 05/28/2011  . CERUMEN IMPACTION, LEFT 08/30/2009  . GLAUCOMA 04/15/2009  . VERTIGO 02/16/2009  . LUPUS 11/09/2008  . Allergic Rhinitis, Cause Unspecified 09/24/2007  . HYPERTENSION 03/22/2004   Past Medical History  Diagnosis Date  . Hypertension 03/22/2004  . Allergy     allergic rhinitis  . Lupus   . Uterine fibroid 11/01    per GYN   Past Surgical History  Procedure Laterality Date  . Ccy    . Endometrial ablation  04/03    polyps?DUB   History  Substance Use Topics  . Smoking status: Never Smoker   . Smokeless tobacco: Not on file  . Alcohol Use: No   No family history on file. No  Known Allergies Current Outpatient Prescriptions on File Prior to Visit  Medication Sig Dispense Refill  . amLODipine (NORVASC) 5 MG tablet Take 5 mg by mouth daily.        Marland Kitchen aspirin 81 MG tablet Take 81 mg by mouth daily.        . cholecalciferol (VITAMIN D) 1000 UNITS tablet Take 2,000 Units by mouth daily.        . hydroxychloroquine (PLAQUENIL) 200 MG tablet Take 200 mg by mouth 2 (two) times daily.        Marland Kitchen latanoprost (XALATAN) 0.005 % ophthalmic solution Place 1 drop into both eyes as directed.        . meclizine (ANTIVERT) 12.5 MG tablet Take 12.5 mg by mouth 3 (three) times daily as needed.        . triamcinolone (NASACORT AQ) 55 MCG/ACT nasal inhaler Place 2 sprays into the nose daily as needed.         No current facility-administered medications on file prior to visit.    Review of Systems Review of Systems  Constitutional: Negative for fever, appetite change, fatigue and unexpected weight change.  Eyes: Negative for pain and  visual disturbance.  ENt pos for congestion and sinus pressure and pain Respiratory: Negative for cough and shortness of breath.   Cardiovascular: Negative for cp or palpitations    Gastrointestinal: Negative for nausea, diarrhea and constipation.  Genitourinary: Negative for urgency and frequency.  Skin: Negative for pallor or rash   Neurological: Negative for weakness, light-headedness, numbness and pos for ha  Hematological: Negative for adenopathy. Does not bruise/bleed easily.  Psychiatric/Behavioral: Negative for dysphoric mood. The patient is not nervous/anxious.         Objective:   Physical Exam  Constitutional: She is oriented to person, place, and time. She appears well-developed and well-nourished. No distress.  HENT:  Head: Normocephalic and atraumatic.  Right Ear: External ear normal.  Left Ear: External ear normal.  Mouth/Throat: Oropharynx is clear and moist. No oropharyngeal exudate.  Nares are injected and congested  L  maxillary and ethmoid sinus tenderness  Eyes: Conjunctivae and EOM are normal. Pupils are equal, round, and reactive to light. Right eye exhibits no discharge. Left eye exhibits no discharge. No scleral icterus.  Neck: Normal range of motion. Neck supple. No JVD present. Carotid bruit is not present. No thyromegaly present.  Cardiovascular: Normal rate and regular rhythm.   Pulmonary/Chest: Effort normal and breath sounds normal.  Musculoskeletal: She exhibits no edema and no tenderness.  Lymphadenopathy:    She has no cervical adenopathy.  Neurological: She is alert and oriented to person, place, and time. She has normal reflexes. She displays no atrophy and no tremor. No cranial nerve deficit or sensory deficit. She exhibits normal muscle tone. Coordination and gait normal.  No cerebellar signs  Skin: Skin is warm and dry. No rash noted. No erythema. No pallor.  Psychiatric: She has a normal mood and affect.          Assessment & Plan:

## 2013-03-23 NOTE — Telephone Encounter (Signed)
Message left notifying patient. Advised to call and schedule follow up with Dr. Milinda Antis to discuss.

## 2013-09-23 ENCOUNTER — Other Ambulatory Visit: Payer: Self-pay | Admitting: Family Medicine

## 2013-09-23 ENCOUNTER — Other Ambulatory Visit: Payer: Self-pay

## 2013-09-23 NOTE — Telephone Encounter (Signed)
Pt called and requested losartan HCTZ refilled to [facility]; pt said rx was denied. 03/23/13 office note had Dr Nicholas Lose d/c hyzaar due to pt having a cough. Pt was started on Benicar. Pt said insurance would not pay for Benicar; Dr Nicholas Lose retired and Dr Milinda Antis put pt back on Losartan HCTZ.Please advise.

## 2013-09-23 NOTE — Telephone Encounter (Signed)
Please refill for 6 mo 

## 2013-09-24 MED ORDER — LOSARTAN POTASSIUM-HCTZ 50-12.5 MG PO TABS: 1.0000 | ORAL_TABLET | Freq: Every day | ORAL | Status: DC

## 2013-09-24 NOTE — Telephone Encounter (Signed)
done

## 2013-10-09 ENCOUNTER — Encounter: Payer: Self-pay | Admitting: Family Medicine

## 2013-10-09 ENCOUNTER — Ambulatory Visit (INDEPENDENT_AMBULATORY_CARE_PROVIDER_SITE_OTHER): Payer: BC Managed Care – PPO | Admitting: Family Medicine

## 2013-10-09 VITALS — BP 122/64 | HR 74 | Temp 98.7°F | Ht 66.0 in | Wt 221.8 lb

## 2013-10-09 DIAGNOSIS — J019 Acute sinusitis, unspecified: Secondary | ICD-10-CM

## 2013-10-09 MED ORDER — AMOXICILLIN-POT CLAVULANATE 875-125 MG PO TABS: 1.0000 | ORAL_TABLET | Freq: Two times a day (BID) | ORAL | Status: DC

## 2013-10-09 MED ORDER — FLUCONAZOLE 150 MG PO TABS: 150.0000 mg | ORAL_TABLET | Freq: Once | ORAL | Status: DC

## 2013-10-09 NOTE — Progress Notes (Signed)
Pre-visit discussion using our clinic review tool. No additional management support is needed unless otherwise documented below in the visit note.  

## 2013-10-09 NOTE — Patient Instructions (Signed)
For sinus infection - take the augmentin as directed  Drink lots of water mucinex may help with congestion along with nasal saline spray  Update if not starting to improve in a week or if worsening    Take the diflucan if you get a yeast infection

## 2013-10-09 NOTE — Progress Notes (Signed)
Subjective:    Patient ID: Jennifer Rose, female    DOB: [birth year], 57 y.o.   MRN: [id number]  HPI Here for sinus symptoms  Over a week of cold symptoms  Pain under eyes in face/ ST / cough / ears ringing / full sinuses  Green nasal d/c   No otc meds  She is using nasacort spray  ? If fever-hard to tell   Patient Active Problem List   Diagnosis Date Noted  . Acute sinusitis 03/23/2013  . Migraine headache 03/23/2013  . Swelling, mass, or lump in chest 05/28/2011  . CERUMEN IMPACTION, LEFT 08/30/2009  . GLAUCOMA 04/15/2009  . VERTIGO 02/16/2009  . LUPUS 11/09/2008  . Allergic Rhinitis, Cause Unspecified 09/24/2007  . HYPERTENSION 03/22/2004   Past Medical History  Diagnosis Date  . Hypertension 03/22/2004  . Allergy     allergic rhinitis  . Lupus   . Uterine fibroid 11/01    per GYN   Past Surgical History  Procedure Laterality Date  . Ccy    . Endometrial ablation  04/03    polyps?DUB   History  Substance Use Topics  . Smoking status: Never Smoker   . Smokeless tobacco: Not on file  . Alcohol Use: No   No family history on file. No Known Allergies Current Outpatient Prescriptions on File Prior to Visit  Medication Sig Dispense Refill  . amLODipine (NORVASC) 5 MG tablet Take 5 mg by mouth daily.        Marland Kitchen aspirin 81 MG tablet Take 81 mg by mouth daily.        . cholecalciferol (VITAMIN D) 1000 UNITS tablet Take 2,000 Units by mouth daily.        . hydroxychloroquine (PLAQUENIL) 200 MG tablet Take 200 mg by mouth 2 (two) times daily.        Marland Kitchen latanoprost (XALATAN) 0.005 % ophthalmic solution Place 1 drop into both eyes as directed.        Marland Kitchen losartan-hydrochlorothiazide (HYZAAR) 50-12.5 MG per tablet Take 1 tablet by mouth daily.  30 tablet  5  . meclizine (ANTIVERT) 12.5 MG tablet Take 12.5 mg by mouth 3 (three) times daily as needed.        . triamcinolone (NASACORT AQ) 55 MCG/ACT nasal inhaler Place 2 sprays into the nose daily as needed.         No  current facility-administered medications on file prior to visit.      Review of Systems    Review of Systems  Constitutional: Negative for fever, appetite change,  and unexpected weight change.  ENT pos for congestion/ sinus pain and pressure and ST Eyes: Negative for pain and visual disturbance.  Respiratory: Negative for wheeze and shortness of breath.  pos for cough Cardiovascular: Negative for cp or palpitations    Gastrointestinal: Negative for nausea, diarrhea and constipation.  Genitourinary: Negative for urgency and frequency.  Skin: Negative for pallor or rash   Neurological: Negative for weakness, light-headedness, numbness and headaches.  Hematological: Negative for adenopathy. Does not bruise/bleed easily.  Psychiatric/Behavioral: Negative for dysphoric mood. The patient is not nervous/anxious.      Objective:   Physical Exam  Constitutional: She appears well-developed and well-nourished. No distress.  obese and well appearing   HENT:  Head: Normocephalic and atraumatic.  Right Ear: External ear normal.  Left Ear: External ear normal.  Mouth/Throat: Oropharynx is clear and moist. No oropharyngeal exudate.  Nares are injected and congested  bilat maxillary and ethmoid  sinus tenderness  Eyes: Conjunctivae and EOM are normal. Pupils are equal, round, and reactive to light. Right eye exhibits no discharge. Left eye exhibits no discharge.  Neck: Normal range of motion. Neck supple.  Cardiovascular: Normal rate and regular rhythm.   Pulmonary/Chest: Effort normal and breath sounds normal. No respiratory distress. She has no wheezes. She has no rales.  Lymphadenopathy:    She has no cervical adenopathy.  Neurological: She is alert. No cranial nerve deficit.  Skin: Skin is warm and dry. No rash noted.  Psychiatric: She has a normal mood and affect.          Assessment & Plan:

## 2013-10-11 NOTE — Assessment & Plan Note (Signed)
Cover with augmentin  Disc symptomatic care - see instructions on AVS  Update if not starting to improve in a week or if worsening   

## 2013-11-18 ENCOUNTER — Other Ambulatory Visit: Payer: Self-pay

## 2013-11-18 DIAGNOSIS — Z1231 Encounter for screening mammogram for malignant neoplasm of breast: Secondary | ICD-10-CM

## 2013-12-07 ENCOUNTER — Inpatient Hospital Stay: Admission: RE | Admit: 2013-12-07 | Payer: BC Managed Care – PPO | Source: Ambulatory Visit

## 2014-04-19 ENCOUNTER — Ambulatory Visit (INDEPENDENT_AMBULATORY_CARE_PROVIDER_SITE_OTHER): Payer: BC Managed Care – PPO | Admitting: Family Medicine

## 2014-04-19 ENCOUNTER — Encounter: Payer: Self-pay | Admitting: Family Medicine

## 2014-04-19 VITALS — BP 150/74 | HR 63 | Temp 97.9°F | Wt 226.5 lb

## 2014-04-19 DIAGNOSIS — J018 Other acute sinusitis: Secondary | ICD-10-CM

## 2014-04-19 MED ORDER — AMOXICILLIN-POT CLAVULANATE 875-125 MG PO TABS: 1.0000 | ORAL_TABLET | Freq: Two times a day (BID) | ORAL | Status: DC

## 2014-04-19 NOTE — Progress Notes (Signed)
Pre visit review using our clinic review tool, if applicable. No additional management support is needed unless otherwise documented below in the visit note.  duration of symptoms:  for the last week.  Sinus pressure, facial pain.   ST recently.   Rhinorrhea: today, yes Congestion: yes ear pain: no, be see below Cough: some Myalgias: some, at baseline other concerns: on plaquenil for SLE.  H/o vertigo last week.  Some tinnitus prev.  H/o both prev.    ROS: See HPI.  Otherwise negative.    Meds, vitals, and allergies reviewed.   GEN: nad, alert and oriented HEENT: mucous membranes moist, TM w/o erythema, nasal epithelium injected, OP with cobblestoning, max > frontal sinus ttp B NECK: supple w/o LA CV: rrr. PULM: ctab, no inc wob ABD: soft, +bs EXT: no edema

## 2014-04-19 NOTE — Assessment & Plan Note (Signed)
Augementin, f/u prn.  Nontoxic. Supportive care o/w.  She agrees.

## 2014-04-19 NOTE — Patient Instructions (Signed)
Try to get some rest and start the antibiotics today.   Drink plenty of fluids.  Glad to see you.

## 2014-04-26 ENCOUNTER — Other Ambulatory Visit: Payer: Self-pay

## 2014-04-26 MED ORDER — FLUCONAZOLE 150 MG PO TABS: 150.0000 mg | ORAL_TABLET | Freq: Once | ORAL | Status: DC

## 2014-04-26 NOTE — Telephone Encounter (Signed)
Please refill times one  F/u if no improvement 

## 2014-04-26 NOTE — Telephone Encounter (Signed)
Pt received antibiotic on 04/19/14 and pt request # 2 Diflucan; pt has clear vaginal discharge with itching at opening of vagina. [facility].Please advise.

## 2014-05-13 ENCOUNTER — Ambulatory Visit
Admission: RE | Admit: 2014-05-13 | Discharge: 2014-05-13 | Disposition: A | Discharge status: Home / Self Care | Payer: BC Managed Care – PPO | Source: Ambulatory Visit

## 2014-05-13 DIAGNOSIS — Z1231 Encounter for screening mammogram for malignant neoplasm of breast: Secondary | ICD-10-CM

## 2014-05-14 ENCOUNTER — Encounter: Payer: Self-pay | Admitting: Family Medicine

## 2014-06-08 ENCOUNTER — Other Ambulatory Visit: Payer: Self-pay | Admitting: Family Medicine

## 2014-06-10 NOTE — Telephone Encounter (Signed)
Left voicemail requesting pt to call office back 

## 2014-06-10 NOTE — Telephone Encounter (Signed)
Please schedule f/u in 6 mo and refill until then

## 2014-06-10 NOTE — Telephone Encounter (Signed)
Electronic refill request, no recent/future appt., please advise  

## 2014-06-11 NOTE — Telephone Encounter (Signed)
appt scheduled and meds refilled 

## 2014-09-20 ENCOUNTER — Ambulatory Visit (INDEPENDENT_AMBULATORY_CARE_PROVIDER_SITE_OTHER): Payer: BC Managed Care – PPO | Admitting: Family Medicine

## 2014-09-20 ENCOUNTER — Encounter: Payer: Self-pay | Admitting: Family Medicine

## 2014-09-20 VITALS — BP 142/90 | HR 76 | Temp 98.6°F | Ht 66.0 in | Wt 223.2 lb

## 2014-09-20 DIAGNOSIS — I10 Essential (primary) hypertension: Secondary | ICD-10-CM

## 2014-09-20 DIAGNOSIS — H6122 Impacted cerumen, left ear: Secondary | ICD-10-CM

## 2014-09-20 LAB — CBC WITH DIFFERENTIAL/PLATELET
Basophils Absolute: 0 10*3/uL (ref 0.0–0.1)
Basophils Relative: 0.5 % (ref 0.0–3.0)
EOS PCT: 5.4 % — AB (ref 0.0–5.0)
Eosinophils Absolute: 0.3 10*3/uL (ref 0.0–0.7)
HEMATOCRIT: 39.3 % (ref 36.0–46.0)
Hemoglobin: 12.7 g/dL (ref 12.0–15.0)
LYMPHS ABS: 1.2 10*3/uL (ref 0.7–4.0)
Lymphocytes Relative: 20 % (ref 12.0–46.0)
MCHC: 32.2 g/dL (ref 30.0–36.0)
MCV: 81 fl (ref 78.0–100.0)
MONO ABS: 0.5 10*3/uL (ref 0.1–1.0)
Monocytes Relative: 8.7 % (ref 3.0–12.0)
Neutro Abs: 4 10*3/uL (ref 1.4–7.7)
Neutrophils Relative %: 65.4 % (ref 43.0–77.0)
PLATELETS: 146 10*3/uL — AB (ref 150.0–400.0)
RBC: 4.86 Mil/uL (ref 3.87–5.11)
RDW: 13.3 % (ref 11.5–15.5)
WBC: 6.1 10*3/uL (ref 4.0–10.5)

## 2014-09-20 LAB — COMPREHENSIVE METABOLIC PANEL
ALK PHOS: 91 U/L (ref 39–117)
ALT: 26 U/L (ref 0–35)
AST: 29 U/L (ref 0–37)
Albumin: 4 g/dL (ref 3.5–5.2)
BUN: 12 mg/dL (ref 6–23)
CO2: 22 mEq/L (ref 19–32)
Calcium: 9.2 mg/dL (ref 8.4–10.5)
Chloride: 107 mEq/L (ref 96–112)
Creatinine, Ser: 0.9 mg/dL (ref 0.4–1.2)
GFR: 83.15 mL/min (ref >60.00)
Glucose, Bld: 118 mg/dL — ABNORMAL HIGH (ref 70–99)
Potassium: 3.4 mEq/L — ABNORMAL LOW (ref 3.5–5.1)
SODIUM: 141 meq/L (ref 135–145)
TOTAL PROTEIN: 7.4 g/dL (ref 6.0–8.3)
Total Bilirubin: 0.3 mg/dL (ref 0.2–1.2)

## 2014-09-20 LAB — LIPID PANEL
Cholesterol: 139 mg/dL (ref 0–200)
HDL: 39.3 mg/dL (ref >39.00)
LDL CALC: 87 mg/dL (ref 0–99)
NonHDL: 99.7
Total CHOL/HDL Ratio: 4
Triglycerides: 64 mg/dL (ref 0.0–149.0)
VLDL: 12.8 mg/dL (ref 0.0–40.0)

## 2014-09-20 LAB — TSH: TSH: 1.94 u[IU]/mL (ref 0.35–4.50)

## 2014-09-20 MED ORDER — LOSARTAN POTASSIUM-HCTZ 50-12.5 MG PO TABS: 1.0000 | ORAL_TABLET | Freq: Every day | ORAL | Status: DC

## 2014-09-20 MED ORDER — AMLODIPINE BESYLATE 5 MG PO TABS: 5.0000 mg | ORAL_TABLET | Freq: Every day | ORAL | Status: DC

## 2014-09-20 NOTE — Assessment & Plan Note (Signed)
I if adding to tinnitus ?  Simple irrigation today

## 2014-09-20 NOTE — Patient Instructions (Signed)
Labs today  Let me know if ear symptoms do not improve after irrigation  Take care of yourself  Start exercising  Work on weight loss

## 2014-09-20 NOTE — Progress Notes (Signed)
Subjective:    Patient ID: Jennifer Rose, female    DOB: 1956-05-24, 58 y.o.   MRN: [id number]  HPI Here for f/u of chronic medical conditions and acute issue  Feeling pretty good overall   Some roaring in her ears - both of them  ? Sinus related  Has hx of cerumen impaction  Also a molar on R hurts-needs to see dentist   No major flare ups of lupus  Still on plaquenil   She last had labs (rheum)- every 6 months - nothing since June  Does not check cholesterol - does check glucose  Will do today   Wt is down 3 lb  bmi in obese range at 36  bp is up a bit today (took advil for her tooth) No cp or palpitations or headaches or edema  No side effects to medicines  BP Readings from Last 3 Encounters:  09/20/14 142/90  04/19/14 150/74  10/09/13 122/64     Better on 2nd check 130/80 On hyzaar and norvasc generic   Is watching diet "best she can" - avoids fatty foods  Needs to begin   Patient Active Problem List   Diagnosis Date Noted  . Acute sinusitis 03/23/2013  . Migraine headache 03/23/2013  . Swelling, mass, or lump in chest 05/28/2011  . CERUMEN IMPACTION, LEFT 08/30/2009  . GLAUCOMA 04/15/2009  . VERTIGO 02/16/2009  . LUPUS 11/09/2008  . Allergic Rhinitis, Cause Unspecified 09/24/2007  . HYPERTENSION 03/22/2004   Past Medical History  Diagnosis Date  . Hypertension 03/22/2004  . Allergy     allergic rhinitis  . Lupus   . Uterine fibroid 11/01    per GYN   Past Surgical History  Procedure Laterality Date  . Ccy    . Endometrial ablation  04/03    polyps?DUB   History  Substance Use Topics  . Smoking status: Never Smoker   . Smokeless tobacco: Not on file  . Alcohol Use: No   No family history on file. No Known Allergies Current Outpatient Prescriptions on File Prior to Visit  Medication Sig Dispense Refill  . amLODipine (NORVASC) 5 MG tablet Take 5 mg by mouth daily.      Marland Kitchen aspirin 81 MG tablet Take 81 mg by mouth daily.      .  cholecalciferol (VITAMIN D) 1000 UNITS tablet Take 2,000 Units by mouth daily.      . hydroxychloroquine (PLAQUENIL) 200 MG tablet Take 200 mg by mouth 2 (two) times daily.      Marland Kitchen latanoprost (XALATAN) 0.005 % ophthalmic solution Place 1 drop into both eyes as directed.      Marland Kitchen losartan-hydrochlorothiazide (HYZAAR) 50-12.5 MG per tablet TAKE 1 TABLET BY MOUTH DAILY 30 tablet 3  . meclizine (ANTIVERT) 12.5 MG tablet Take 12.5 mg by mouth 3 (three) times daily as needed.      . triamcinolone (NASACORT AQ) 55 MCG/ACT nasal inhaler Place 2 sprays into the nose daily as needed.       No current facility-administered medications on file prior to visit.    Review of Systems Review of Systems  Constitutional: Negative for fever, appetite change, fatigue and unexpected weight change.  Eyes: Negative for pain and visual disturbance.  ENT pos for ear discomofmort and roaring sound , neg for sinus pain or rhinorrhea  Respiratory: Negative for cough and shortness of breath.   Cardiovascular: Negative for cp or palpitations    Gastrointestinal: Negative for nausea, diarrhea and constipation.  Genitourinary:  Negative for urgency and frequency.  Skin: Negative for pallor or rash   Neurological: Negative for weakness, light-headedness, numbness and headaches.  Hematological: Negative for adenopathy. Does not bruise/bleed easily.  Psychiatric/Behavioral: Negative for dysphoric mood. The patient is not nervous/anxious.         Objective:   Physical Exam  Constitutional: She appears well-developed and well-nourished. No distress.  obese and well appearing   HENT:  Head: Normocephalic and atraumatic.  Right Ear: External ear normal.  Mouth/Throat: Oropharynx is clear and moist.  R canal impacted with cerument  No sinus tenderness  Nares are boggy  Eyes: Conjunctivae and EOM are normal. Pupils are equal, round, and reactive to light. No scleral icterus.  Neck: Normal range of motion. Neck supple. No  JVD present. Carotid bruit is not present. No thyromegaly present.  Cardiovascular: Normal rate, regular rhythm, normal heart sounds and intact distal pulses.  Exam reveals no gallop.   Pulmonary/Chest: Effort normal and breath sounds normal. No respiratory distress. She has no wheezes. She has no rales.  No crackles   Musculoskeletal: She exhibits no edema.  Lymphadenopathy:    She has no cervical adenopathy.  Neurological: She is alert. She has normal reflexes. No cranial nerve deficit. She exhibits normal muscle tone. Coordination normal.  Skin: Skin is warm and dry. No rash noted. No erythema. No pallor.  Psychiatric: She has a normal mood and affect.          Assessment & Plan:   Problem List Items Addressed This Visit      Cardiovascular and Mediastinum   Essential hypertension - Primary    bp in fair control at this time  130/80  No changes needed Disc lifstyle change with low sodium diet and exercise  Wt loss enc      Relevant Medications      losartan-hydrochlorothiazide (HYZAAR) 50-12.5 MG per tablet      amLODIpine (NORVASC) tablet   Other Relevant Orders      Comprehensive metabolic panel (Completed)      CBC with Differential (Completed)      TSH (Completed)      Lipid panel (Completed)     Nervous and Auditory   CERUMEN IMPACTION, LEFT    I if adding to tinnitus ?  Simple irrigation today

## 2014-09-20 NOTE — Progress Notes (Signed)
Pre visit review using our clinic review tool, if applicable. No additional management support is needed unless otherwise documented below in the visit note. 

## 2014-09-20 NOTE — Assessment & Plan Note (Signed)
bp in fair control at this time  130/80  No changes needed Disc lifstyle change with low sodium diet and exercise  Wt loss enc

## 2014-09-21 ENCOUNTER — Telehealth: Payer: Self-pay | Admitting: Family Medicine

## 2014-09-21 ENCOUNTER — Other Ambulatory Visit (INDEPENDENT_AMBULATORY_CARE_PROVIDER_SITE_OTHER): Payer: BC Managed Care – PPO

## 2014-09-21 DIAGNOSIS — R7309 Other abnormal glucose: Secondary | ICD-10-CM

## 2014-09-21 LAB — HEMOGLOBIN A1C: HEMOGLOBIN A1C: 6.3 % (ref 4.6–6.5)

## 2014-09-21 NOTE — Telephone Encounter (Signed)
emmi mailed  °

## 2014-09-22 ENCOUNTER — Encounter: Payer: Self-pay | Admitting: *Deleted

## 2014-12-13 ENCOUNTER — Encounter: Payer: Self-pay | Admitting: Family Medicine

## 2014-12-13 ENCOUNTER — Ambulatory Visit (INDEPENDENT_AMBULATORY_CARE_PROVIDER_SITE_OTHER): Payer: BC Managed Care – PPO | Admitting: Family Medicine

## 2014-12-13 VITALS — BP 112/62 | HR 69 | Temp 98.1°F | Ht 66.0 in | Wt 222.0 lb

## 2014-12-13 DIAGNOSIS — E669 Obesity, unspecified: Secondary | ICD-10-CM

## 2014-12-13 DIAGNOSIS — E661 Drug-induced obesity: Secondary | ICD-10-CM | POA: Insufficient documentation

## 2014-12-13 DIAGNOSIS — Z6837 Body mass index (BMI) 37.0-37.9, adult: Secondary | ICD-10-CM | POA: Insufficient documentation

## 2014-12-13 DIAGNOSIS — Z6835 Body mass index (BMI) 35.0-35.9, adult: Secondary | ICD-10-CM

## 2014-12-13 DIAGNOSIS — Z6836 Body mass index (BMI) 36.0-36.9, adult: Secondary | ICD-10-CM | POA: Insufficient documentation

## 2014-12-13 DIAGNOSIS — I10 Essential (primary) hypertension: Secondary | ICD-10-CM

## 2014-12-13 DIAGNOSIS — R7303 Prediabetes: Secondary | ICD-10-CM | POA: Insufficient documentation

## 2014-12-13 DIAGNOSIS — R739 Hyperglycemia, unspecified: Secondary | ICD-10-CM

## 2014-12-13 NOTE — Progress Notes (Signed)
Pre visit review using our clinic review tool, if applicable. No additional management support is needed unless otherwise documented below in the visit note. 

## 2014-12-13 NOTE — Assessment & Plan Note (Signed)
Discussed how this problem influences overall health and the risks it imposes  Reviewed plan for weight loss with lower calorie diet (via better food choices and also portion control or program like weight watchers) and exercise building up to or more than 30 minutes 5 days per week including some aerobic activity    

## 2014-12-13 NOTE — Patient Instructions (Signed)
Take care of yourself  Continue good diet  Work on weight loss- increase exercise to 5 days per week if possible   Keep walking  Consider other exercise also    Schedule non fasting labs after 3/1

## 2014-12-13 NOTE — Assessment & Plan Note (Signed)
Lab Results  Component Value Date   HGBA1C 6.3 09/21/2014   This is in pre diabetic range  Pt will continue to work on wt loss  Disc inc exercise to 5 d per week and gradual inc in time/intensity (low impact)  Diet is fair -pt is informed  Check a1C after 3/1   No symptoms

## 2014-12-13 NOTE — Progress Notes (Signed)
Subjective:    Patient ID: Jennifer Rose, female    DOB: 02/25/1956, 59 y.o.   MRN: [id number]  HPI Here for f/u of chronic conditions   Is feeling ok overall  Some aches and pains from the lupus   Trying to loose - wt is down 1 1/2 lb with bmi of 35  (3 d per week)  She is walking for exercise -- hard with schedule  May be interested in using a bike   Hyperglycemia  Lab Results  Component Value Date   HGBA1C 6.3 09/21/2014   This is in the pre diabetes range  She does not eat sweets as a rule  Is paying attention   bkfast-yogurt and granola Lunch-salad with chicken  Pm-not really hungry - meat and 2 veg  She pays close attn to diet   Not a lot of carbs/does not eat bread  /potato and pasta  No longer eats mac and cheese  Also avoids fried food and fast food  BP Readings from Last 3 Encounters:  12/13/14 112/62  09/20/14 142/90  04/19/14 150/74    Lab Results  Component Value Date   CHOL 139 09/20/2014   HDL 39.30 09/20/2014   LDLCALC 87 09/20/2014   TRIG 64.0 09/20/2014   CHOLHDL 4 09/20/2014   Lab Results  Component Value Date   TSH 1.94 09/20/2014     Last labs from rheum- all was good   Patient Active Problem List   Diagnosis Date Noted  . Hyperglycemia 12/13/2014  . Acute sinusitis 03/23/2013  . Migraine headache 03/23/2013  . Swelling, mass, or lump in chest 05/28/2011  . CERUMEN IMPACTION, LEFT 08/30/2009  . GLAUCOMA 04/15/2009  . VERTIGO 02/16/2009  . LUPUS 11/09/2008  . Allergic Rhinitis, Cause Unspecified 09/24/2007  . Essential hypertension 03/22/2004   Past Medical History  Diagnosis Date  . Hypertension 03/22/2004  . Allergy     allergic rhinitis  . Lupus   . Uterine fibroid 11/01    per GYN   Past Surgical History  Procedure Laterality Date  . Ccy    . Endometrial ablation  04/03    polyps?DUB   History  Substance Use Topics  . Smoking status: Never Smoker   . Smokeless tobacco: Not on file  . Alcohol Use: No   No  family history on file. No Known Allergies Current Outpatient Prescriptions on File Prior to Visit  Medication Sig Dispense Refill  . amLODipine (NORVASC) 5 MG tablet Take 1 tablet (5 mg total) by mouth daily. 30 tablet 11  . aspirin 81 MG tablet Take 81 mg by mouth daily.      . cholecalciferol (VITAMIN D) 1000 UNITS tablet Take 2,000 Units by mouth daily.      . hydroxychloroquine (PLAQUENIL) 200 MG tablet Take 200 mg by mouth 2 (two) times daily.      Marland Kitchen latanoprost (XALATAN) 0.005 % ophthalmic solution Place 1 drop into both eyes as directed.      Marland Kitchen losartan-hydrochlorothiazide (HYZAAR) 50-12.5 MG per tablet Take 1 tablet by mouth daily. 30 tablet 11  . meclizine (ANTIVERT) 12.5 MG tablet Take 12.5 mg by mouth 3 (three) times daily as needed.      . triamcinolone (NASACORT AQ) 55 MCG/ACT nasal inhaler Place 2 sprays into the nose daily as needed.       No current facility-administered medications on file prior to visit.     Review of Systems    Review of Systems  Constitutional:  Negative for fever, appetite change, fatigue and unexpected weight change.  Eyes: Negative for pain and visual disturbance.  Respiratory: Negative for cough and shortness of breath.   Cardiovascular: Negative for cp or palpitations    Gastrointestinal: Negative for nausea, diarrhea and constipation.  Genitourinary: Negative for urgency and frequency.  Skin: Negative for pallor or rash   Neurological: Negative for weakness, light-headedness, numbness and headaches.  Hematological: Negative for adenopathy. Does not bruise/bleed easily.  Psychiatric/Behavioral: Negative for dysphoric mood. The patient is not nervous/anxious.      Objective:   Physical Exam  Constitutional: She appears well-developed and well-nourished. No distress.  obese and well appearing   HENT:  Head: Normocephalic and atraumatic.  Mouth/Throat: Oropharynx is clear and moist.  Eyes: Conjunctivae and EOM are normal. Pupils are equal,  round, and reactive to light. Right eye exhibits no discharge. Left eye exhibits no discharge. No scleral icterus.  Neck: Normal range of motion. Neck supple. No JVD present. No thyromegaly present.  Cardiovascular: Normal rate, regular rhythm, normal heart sounds and intact distal pulses.  Exam reveals no gallop.   Pulmonary/Chest: Effort normal and breath sounds normal. No respiratory distress. She has no wheezes. She has no rales.  Musculoskeletal: She exhibits no edema.  Lymphadenopathy:    She has no cervical adenopathy.  Neurological: She is alert. She has normal reflexes. No cranial nerve deficit. She exhibits normal muscle tone. Coordination normal.  Skin: Skin is warm and dry. No rash noted. No erythema. No pallor.  Psychiatric: She has a normal mood and affect.          Assessment & Plan:   Problem List Items Addressed This Visit      Cardiovascular and Mediastinum   Essential hypertension    bp in fair control at this time  BP Readings from Last 1 Encounters:  12/13/14 112/62   No changes needed Disc lifstyle change with low sodium diet and exercise   Commended exercise         Other   Hyperglycemia - Primary    Lab Results  Component Value Date   HGBA1C 6.3 09/21/2014   This is in pre diabetic range  Pt will continue to work on wt loss  Disc inc exercise to 5 d per week and gradual inc in time/intensity (low impact)  Diet is fair -pt is informed  Check a1C after 3/1   No symptoms       Obesity    Discussed how this problem influences overall health and the risks it imposes  Reviewed plan for weight loss with lower calorie diet (via better food choices and also portion control or program like weight watchers) and exercise building up to or more than 30 minutes 5 days per week including some aerobic activity

## 2014-12-13 NOTE — Assessment & Plan Note (Signed)
bp in fair control at this time  BP Readings from Last 1 Encounters:  12/13/14 112/62   No changes needed Disc lifstyle change with low sodium diet and exercise   Commended exercise

## 2014-12-29 ENCOUNTER — Other Ambulatory Visit (INDEPENDENT_AMBULATORY_CARE_PROVIDER_SITE_OTHER): Payer: BC Managed Care – PPO

## 2014-12-29 DIAGNOSIS — R739 Hyperglycemia, unspecified: Secondary | ICD-10-CM

## 2014-12-29 LAB — HEMOGLOBIN A1C: Hgb A1c MFr Bld: 6.1 % (ref 4.6–6.5)

## 2014-12-30 ENCOUNTER — Telehealth: Payer: Self-pay

## 2014-12-30 NOTE — Telephone Encounter (Signed)
Left message for patient to call office regarding lab results.

## 2014-12-30 NOTE — Telephone Encounter (Signed)
-----   Message from Abner Greenspan, MD sent at 12/30/2014  8:43 AM EST ----- Improved A1C of 6.1 Keep working on diet and exercise  Follow up in 6 mo with labs prior for annual exam (if she does not want a physical, just make it a f/u please)

## 2015-04-11 ENCOUNTER — Other Ambulatory Visit: Payer: Self-pay

## 2015-04-11 DIAGNOSIS — Z1231 Encounter for screening mammogram for malignant neoplasm of breast: Secondary | ICD-10-CM

## 2015-05-19 ENCOUNTER — Ambulatory Visit: Payer: BC Managed Care – PPO

## 2015-05-23 ENCOUNTER — Ambulatory Visit (INDEPENDENT_AMBULATORY_CARE_PROVIDER_SITE_OTHER): Payer: BC Managed Care – PPO | Admitting: Family Medicine

## 2015-05-23 ENCOUNTER — Encounter: Payer: Self-pay | Admitting: Family Medicine

## 2015-05-23 ENCOUNTER — Encounter (INDEPENDENT_AMBULATORY_CARE_PROVIDER_SITE_OTHER): Payer: Self-pay

## 2015-05-23 VITALS — BP 118/72 | HR 61 | Temp 98.4°F | Ht 66.0 in | Wt 222.0 lb

## 2015-05-23 DIAGNOSIS — J018 Other acute sinusitis: Secondary | ICD-10-CM

## 2015-05-23 DIAGNOSIS — Z23 Encounter for immunization: Secondary | ICD-10-CM

## 2015-05-23 MED ORDER — AMOXICILLIN-POT CLAVULANATE 875-125 MG PO TABS: 1.0000 | ORAL_TABLET | Freq: Two times a day (BID) | ORAL | Status: DC

## 2015-05-23 NOTE — Progress Notes (Signed)
Subjective:    Patient ID: Jennifer Rose, female    DOB: November 26, 1955, 59 y.o.   MRN: [id number]  HPI Here with uri symptoms  Started a week ago with ST Then congestion and ear fullness with vertigo   No fever   Some cough - with prod of mucous/ not a lot  Nagging -worse at night   Taking no meds otc  Did use some meclizine prn   Patient Active Problem List   Diagnosis Date Noted  . Hyperglycemia 12/13/2014  . Obesity 12/13/2014  . Acute sinusitis 03/23/2013  . Migraine headache 03/23/2013  . Swelling, mass, or lump in chest 05/28/2011  . CERUMEN IMPACTION, LEFT 08/30/2009  . GLAUCOMA 04/15/2009  . VERTIGO 02/16/2009  . LUPUS 11/09/2008  . Allergic rhinitis, cause unspecified 09/24/2007  . Essential hypertension 03/22/2004   Past Medical History  Diagnosis Date  . Hypertension 03/22/2004  . Allergy     allergic rhinitis  . Lupus   . Uterine fibroid 11/01    per GYN   Past Surgical History  Procedure Laterality Date  . Ccy    . Endometrial ablation  04/03    polyps?DUB   History  Substance Use Topics  . Smoking status: Never Smoker   . Smokeless tobacco: Not on file  . Alcohol Use: No   No family history on file. No Known Allergies Current Outpatient Prescriptions on File Prior to Visit  Medication Sig Dispense Refill  . amLODipine (NORVASC) 5 MG tablet Take 1 tablet (5 mg total) by mouth daily. 30 tablet 11  . aspirin 81 MG tablet Take 81 mg by mouth daily.      . cholecalciferol (VITAMIN D) 1000 UNITS tablet Take 2,000 Units by mouth daily.      . hydroxychloroquine (PLAQUENIL) 200 MG tablet Take 200 mg by mouth 2 (two) times daily.      Marland Kitchen latanoprost (XALATAN) 0.005 % ophthalmic solution Place 1 drop into both eyes as directed.      Marland Kitchen losartan-hydrochlorothiazide (HYZAAR) 50-12.5 MG per tablet Take 1 tablet by mouth daily. 30 tablet 11  . meclizine (ANTIVERT) 12.5 MG tablet Take 12.5 mg by mouth 3 (three) times daily as needed.      . triamcinolone  (NASACORT AQ) 55 MCG/ACT nasal inhaler Place 2 sprays into the nose daily as needed.       No current facility-administered medications on file prior to visit.      Review of Systems Review of Systems  Constitutional: Negative for fever, appetite change,  and unexpected weight change.  ENt pos for congestion and sinus pain and pressure and post nasal drip  Eyes: Negative for pain and visual disturbance.  Respiratory: Negative for wheeze  and shortness of breath.   Cardiovascular: Negative for cp or palpitations    Gastrointestinal: Negative for nausea, diarrhea and constipation.  Genitourinary: Negative for urgency and frequency.  Skin: Negative for pallor or rash   Neurological: Negative for weakness, light-headedness, numbness and headaches.  Hematological: Negative for adenopathy. Does not bruise/bleed easily.  Psychiatric/Behavioral: Negative for dysphoric mood. The patient is not nervous/anxious.         Objective:   Physical Exam  Constitutional: She appears well-developed and well-nourished. No distress.  obese and well appearing   HENT:  Head: Normocephalic and atraumatic.  Right Ear: External ear normal.  Left Ear: External ear normal.  Mouth/Throat: Oropharynx is clear and moist. No oropharyngeal exudate.  Nares are injected and congested  Bilateral maxillary  sinus tenderness  Post nasal drip   Eyes: Conjunctivae and EOM are normal. Pupils are equal, round, and reactive to light. Right eye exhibits no discharge. Left eye exhibits no discharge.  Neck: Normal range of motion. Neck supple.  Cardiovascular: Normal rate and regular rhythm.   Pulmonary/Chest: Effort normal and breath sounds normal. No respiratory distress. She has no wheezes. She has no rales.  Lymphadenopathy:    She has no cervical adenopathy.  Neurological: She is alert. No cranial nerve deficit.  Skin: Skin is warm and dry. No rash noted.  Psychiatric: She has a normal mood and affect.            Assessment & Plan:   Problem List Items Addressed This Visit    Acute sinusitis    Cover with augmentin Disc symptomatic care - see instructions on AVS  Update if not starting to improve in a week or if worsening          Relevant Medications   amoxicillin-clavulanate (AUGMENTIN) 875-125 MG per tablet    Other Visit Diagnoses    Need for diphtheria-tetanus-pertussis (Tdap) vaccine, adult/adolescent    -  Primary    Relevant Orders    Tdap vaccine greater than or equal to 7yo IM (Completed)

## 2015-05-23 NOTE — Progress Notes (Signed)
Pre visit review using our clinic review tool, if applicable. No additional management support is needed unless otherwise documented below in the visit note. 

## 2015-05-23 NOTE — Assessment & Plan Note (Signed)
Cover with augmentin  Disc symptomatic care - see instructions on AVS  Update if not starting to improve in a week or if worsening   

## 2015-05-23 NOTE — Patient Instructions (Addendum)
Take augmentin as directed  Drink lots of fluids  Try mucinex if helpful for congestion  Update if not starting to improve in a week or if worsening   Saline nasal spray or a netti pot if helpful

## 2015-06-21 ENCOUNTER — Ambulatory Visit
Admission: RE | Admit: 2015-06-21 | Discharge: 2015-06-21 | Disposition: A | Discharge status: Home / Self Care | Payer: BC Managed Care – PPO | Source: Ambulatory Visit

## 2015-06-21 DIAGNOSIS — Z1231 Encounter for screening mammogram for malignant neoplasm of breast: Secondary | ICD-10-CM

## 2015-06-22 ENCOUNTER — Other Ambulatory Visit: Payer: Self-pay | Admitting: Family Medicine

## 2015-06-22 DIAGNOSIS — R928 Other abnormal and inconclusive findings on diagnostic imaging of breast: Secondary | ICD-10-CM

## 2015-06-30 ENCOUNTER — Ambulatory Visit
Admission: RE | Admit: 2015-06-30 | Discharge: 2015-06-30 | Disposition: A | Discharge status: Home / Self Care | Payer: BC Managed Care – PPO | Source: Ambulatory Visit | Attending: Family Medicine | Admitting: Family Medicine

## 2015-06-30 DIAGNOSIS — R928 Other abnormal and inconclusive findings on diagnostic imaging of breast: Secondary | ICD-10-CM

## 2015-10-20 ENCOUNTER — Other Ambulatory Visit: Payer: Self-pay | Admitting: Family Medicine

## 2015-10-28 ENCOUNTER — Other Ambulatory Visit: Payer: Self-pay

## 2015-10-28 ENCOUNTER — Other Ambulatory Visit: Payer: Self-pay | Admitting: Family Medicine

## 2015-10-28 ENCOUNTER — Telehealth: Payer: Self-pay | Admitting: Family Medicine

## 2015-10-28 MED ORDER — FLUCONAZOLE 150 MG PO TABS: 150.0000 mg | ORAL_TABLET | Freq: Once | ORAL | Status: DC

## 2015-10-28 NOTE — Telephone Encounter (Signed)
Please refill times one  

## 2015-10-28 NOTE — Telephone Encounter (Signed)
Pt stated she has been on antibiotic since 09/25/15.  She has a yeast infection now and wanted to know if dr tower would call her something for this  She needs 3 doses. [place] Please advise when this has been called in

## 2015-10-28 NOTE — Telephone Encounter (Signed)
Rx sent and pt notified.

## 2015-10-28 NOTE — Telephone Encounter (Signed)
I had given the ok to refill- please correct the # of pills to 3 on the px thanks

## 2015-10-28 NOTE — Telephone Encounter (Signed)
Bea at [facility] left v/m; pt had dental surgery done and has been on abx and now thinks has yeast infection and needs refill on diflucan.Please advise.

## 2015-10-28 NOTE — Telephone Encounter (Signed)
done

## 2015-12-16 ENCOUNTER — Encounter: Payer: Self-pay | Admitting: Family Medicine

## 2015-12-16 ENCOUNTER — Ambulatory Visit (INDEPENDENT_AMBULATORY_CARE_PROVIDER_SITE_OTHER): Payer: BC Managed Care – PPO | Admitting: Family Medicine

## 2015-12-16 VITALS — BP 116/68 | HR 66 | Temp 98.2°F | Ht 66.0 in | Wt 224.0 lb

## 2015-12-16 DIAGNOSIS — I10 Essential (primary) hypertension: Secondary | ICD-10-CM

## 2015-12-16 DIAGNOSIS — R739 Hyperglycemia, unspecified: Secondary | ICD-10-CM | POA: Diagnosis not present

## 2015-12-16 DIAGNOSIS — E669 Obesity, unspecified: Secondary | ICD-10-CM | POA: Diagnosis not present

## 2015-12-16 LAB — COMPREHENSIVE METABOLIC PANEL
ALT: 24 U/L (ref 0–35)
AST: 24 U/L (ref 0–37)
Albumin: 4.1 g/dL (ref 3.5–5.2)
Alkaline Phosphatase: 96 U/L (ref 39–117)
BUN: 13 mg/dL (ref 6–23)
CHLORIDE: 102 meq/L (ref 96–112)
CO2: 30 meq/L (ref 19–32)
CREATININE: 0.89 mg/dL (ref 0.40–1.20)
Calcium: 9.1 mg/dL (ref 8.4–10.5)
GFR: 83.86 mL/min (ref >60.00)
Glucose, Bld: 126 mg/dL — ABNORMAL HIGH (ref 70–99)
Potassium: 3.2 mEq/L — ABNORMAL LOW (ref 3.5–5.1)
Sodium: 138 mEq/L (ref 135–145)
Total Bilirubin: 0.5 mg/dL (ref 0.2–1.2)
Total Protein: 7.2 g/dL (ref 6.0–8.3)

## 2015-12-16 LAB — LIPID PANEL
Cholesterol: 130 mg/dL (ref 0–200)
HDL: 40.8 mg/dL (ref >39.00)
LDL CALC: 78 mg/dL (ref 0–99)
NonHDL: 89.1
TRIGLYCERIDES: 57 mg/dL (ref 0.0–149.0)
Total CHOL/HDL Ratio: 3
VLDL: 11.4 mg/dL (ref 0.0–40.0)

## 2015-12-16 LAB — CBC WITH DIFFERENTIAL/PLATELET
BASOS PCT: 0.4 % (ref 0.0–3.0)
Basophils Absolute: 0 10*3/uL (ref 0.0–0.1)
EOS ABS: 0.1 10*3/uL (ref 0.0–0.7)
EOS PCT: 2.3 % (ref 0.0–5.0)
HEMATOCRIT: 39 % (ref 36.0–46.0)
Hemoglobin: 13 g/dL (ref 12.0–15.0)
Lymphocytes Relative: 20.8 % (ref 12.0–46.0)
Lymphs Abs: 1.2 10*3/uL (ref 0.7–4.0)
MCHC: 33.4 g/dL (ref 30.0–36.0)
MCV: 79.9 fl (ref 78.0–100.0)
MONO ABS: 0.5 10*3/uL (ref 0.1–1.0)
Monocytes Relative: 9.2 % (ref 3.0–12.0)
NEUTROS ABS: 3.7 10*3/uL (ref 1.4–7.7)
Neutrophils Relative %: 67.3 % (ref 43.0–77.0)
PLATELETS: 147 10*3/uL — AB (ref 150.0–400.0)
RBC: 4.88 Mil/uL (ref 3.87–5.11)
RDW: 14.3 % (ref 11.5–15.5)
WBC: 5.6 10*3/uL (ref 4.0–10.5)

## 2015-12-16 LAB — TSH: TSH: 1.51 u[IU]/mL (ref 0.35–4.50)

## 2015-12-16 MED ORDER — AMLODIPINE BESYLATE 5 MG PO TABS: 5.0000 mg | ORAL_TABLET | Freq: Every day | ORAL | Status: DC

## 2015-12-16 MED ORDER — LOSARTAN POTASSIUM-HCTZ 50-12.5 MG PO TABS: 1.0000 | ORAL_TABLET | Freq: Every day | ORAL | Status: DC

## 2015-12-16 NOTE — Progress Notes (Signed)
Subjective:    Patient ID: Jennifer Rose, female    DOB: April 12, 1956, 60 y.o.   MRN: [id number]  HPI Here for f/u of HTN and also an arm problem  Her L arm feels puffy over antecubital area of L arm -not painful/just noticed it  A few weeks ago it was achey  No injury  No elbow joint pain  ? If rel to lupus    Wt is up 2 lb with bmi of 36 It trying to loose wt  She is eating less and also making better choices  Avoids sodas and bread  Exercise - has been too busy to get any walking done (though job is active)   bp is stable today  No cp or palpitations or headaches or edema  No side effects to medicines  BP Readings from Last 3 Encounters:  12/16/15 116/68  05/23/15 118/72  12/13/14 112/62       Chemistry      Component Value Date/Time   NA 141 09/20/2014 0849   K 3.4* 09/20/2014 0849   CL 107 09/20/2014 0849   CO2 22 09/20/2014 0849   BUN 12 09/20/2014 0849   CREATININE 0.9 09/20/2014 0849      Component Value Date/Time   CALCIUM 9.2 09/20/2014 0849   ALKPHOS 91 09/20/2014 0849   AST 29 09/20/2014 0849   ALT 26 09/20/2014 0849   BILITOT 0.3 09/20/2014 0849      Patient Active Problem List   Diagnosis Date Noted  . Hyperglycemia 12/13/2014  . Obesity 12/13/2014  . Acute sinusitis 03/23/2013  . Migraine headache 03/23/2013  . Swelling, mass, or lump in chest 05/28/2011  . CERUMEN IMPACTION, LEFT 08/30/2009  . GLAUCOMA 04/15/2009  . VERTIGO 02/16/2009  . LUPUS 11/09/2008  . Allergic rhinitis, cause unspecified 09/24/2007  . Essential hypertension 03/22/2004   Past Medical History  Diagnosis Date  . Hypertension 03/22/2004  . Allergy     allergic rhinitis  . Lupus ([place])   . Uterine fibroid 11/01    per GYN   Past Surgical History  Procedure Laterality Date  . Ccy    . Endometrial ablation  04/03    polyps?DUB   Social History  Substance Use Topics  . Smoking status: Never Smoker   . Smokeless tobacco: None  . Alcohol Use: No   No family  history on file. No Known Allergies Current Outpatient Prescriptions on File Prior to Visit  Medication Sig Dispense Refill  . amLODipine (NORVASC) 5 MG tablet TAKE 1 TABLET BY MOUTH DAILY 30 tablet 1  . aspirin 81 MG tablet Take 81 mg by mouth daily.      . cholecalciferol (VITAMIN D) 1000 UNITS tablet Take 2,000 Units by mouth daily.      . hydroxychloroquine (PLAQUENIL) 200 MG tablet Take 200 mg by mouth 2 (two) times daily.      Marland Kitchen latanoprost (XALATAN) 0.005 % ophthalmic solution Place 1 drop into both eyes as directed.      Marland Kitchen losartan-hydrochlorothiazide (HYZAAR) 50-12.5 MG tablet TAKE 1 TABLET BY MOUTH DAILY 30 tablet 1  . meclizine (ANTIVERT) 12.5 MG tablet Take 12.5 mg by mouth 3 (three) times daily as needed.      . triamcinolone (NASACORT AQ) 55 MCG/ACT nasal inhaler Place 2 sprays into the nose daily as needed.       No current facility-administered medications on file prior to visit.    Review of Systems Review of Systems  Constitutional: Negative for  fever, appetite change, fatigue and unexpected weight change.  Eyes: Negative for pain and visual disturbance.  Respiratory: Negative for cough and shortness of breath.   Cardiovascular: Negative for cp or palpitations    Gastrointestinal: Negative for nausea, diarrhea and constipation.  Genitourinary: Negative for urgency and frequency.  Skin: Negative for pallor or rash   MSK pos for occ joint pain from SLE  Neurological: Negative for weakness, light-headedness, numbness and headaches.  Hematological: Negative for adenopathy. Does not bruise/bleed easily.  Psychiatric/Behavioral: Negative for dysphoric mood. The patient is not nervous/anxious.         Objective:   Physical Exam  Constitutional: She appears well-developed and well-nourished. No distress.  Well appearing and obese   HENT:  Head: Normocephalic and atraumatic.  Mouth/Throat: Oropharynx is clear and moist.  Eyes: Conjunctivae and EOM are normal. Pupils are  equal, round, and reactive to light.  Neck: Normal range of motion. Neck supple. No JVD present. Carotid bruit is not present. No thyromegaly present.  Cardiovascular: Normal rate, regular rhythm, normal heart sounds and intact distal pulses.  Exam reveals no gallop.   Pulmonary/Chest: Effort normal and breath sounds normal. No respiratory distress. She has no wheezes. She has no rales.  No crackles  Abdominal: Soft. Bowel sounds are normal. She exhibits no distension, no abdominal bruit and no mass. There is no tenderness.  Musculoskeletal: She exhibits no edema.  No acute joint changes  Some fat deposition over L (and R lesser so) antecubital areas   No tenderness/redness noted   Lymphadenopathy:    She has no cervical adenopathy.  Neurological: She is alert. She has normal reflexes. No cranial nerve deficit. She exhibits normal muscle tone. Coordination normal.  Skin: Skin is warm and dry. No rash noted.  Psychiatric: She has a normal mood and affect.          Assessment & Plan:   Problem List Items Addressed This Visit      Cardiovascular and Mediastinum   Essential hypertension - Primary    bp in fair control at this time  BP Readings from Last 1 Encounters:  12/16/15 116/68   No changes needed Disc lifstyle change with low sodium diet and exercise  Labs today  Enc wt loss with exercise and good diet habits       Relevant Medications   amLODipine (NORVASC) 5 MG tablet   losartan-hydrochlorothiazide (HYZAAR) 50-12.5 MG tablet   Other Relevant Orders   CBC with Differential/Platelet (Completed)   Comprehensive metabolic panel (Completed)   TSH (Completed)   Lipid panel (Completed)     Other   Hyperglycemia    A1C today  Enc low glycemic diet and wt loss to prevent diabetes       Relevant Orders   Hemoglobin A1c   Obesity    Discussed how this problem influences overall health and the risks it imposes  Reviewed plan for weight loss with lower calorie diet  (via better food choices and also portion control or program like weight watchers) and exercise building up to or more than 30 minutes 5 days per week including some aerobic activity   She is already making some good dietary changes

## 2015-12-16 NOTE — Patient Instructions (Signed)
Ask your rheumatologist about getting a tetanus shot (Tdap)  Also if you re consider a flu shot - get one  BP is good  Keep up healthy habits Labs today

## 2015-12-16 NOTE — Progress Notes (Signed)
Pre visit review using our clinic review tool, if applicable. No additional management support is needed unless otherwise documented below in the visit note. 

## 2015-12-18 ENCOUNTER — Telehealth: Payer: Self-pay | Admitting: Family Medicine

## 2015-12-18 MED ORDER — POTASSIUM CHLORIDE ER 10 MEQ PO TBCR
10.0000 meq | EXTENDED_RELEASE_TABLET | Freq: Every day | ORAL | Status: DC
Start: 2015-12-18 — End: 2015-12-21

## 2015-12-18 NOTE — Assessment & Plan Note (Signed)
bp in fair control at this time  BP Readings from Last 1 Encounters:  12/16/15 116/68   No changes needed Disc lifstyle change with low sodium diet and exercise  Labs today  Enc wt loss with exercise and good diet habits

## 2015-12-18 NOTE — Assessment & Plan Note (Signed)
Discussed how this problem influences overall health and the risks it imposes  Reviewed plan for weight loss with lower calorie diet (via better food choices and also portion control or program like weight watchers) and exercise building up to or more than 30 minutes 5 days per week including some aerobic activity   She is already making some good dietary changes

## 2015-12-18 NOTE — Telephone Encounter (Signed)
Potassium is low -likely from diuretic medicine Please call in potassium Re check K in 2 weeks please   Still pending A1C Other labs are fairly stable

## 2015-12-18 NOTE — Assessment & Plan Note (Signed)
A1C today  Enc low glycemic diet and wt loss to prevent diabetes

## 2015-12-19 ENCOUNTER — Other Ambulatory Visit (INDEPENDENT_AMBULATORY_CARE_PROVIDER_SITE_OTHER): Payer: BC Managed Care – PPO

## 2015-12-19 DIAGNOSIS — R739 Hyperglycemia, unspecified: Secondary | ICD-10-CM

## 2015-12-19 LAB — HEMOGLOBIN A1C: Hgb A1c MFr Bld: 6.1 % (ref 4.6–6.5)

## 2015-12-19 NOTE — Telephone Encounter (Signed)
Lab didn't draw a1c, they are doing it now

## 2015-12-20 NOTE — Telephone Encounter (Signed)
Left voicemail requesting pt to call office back 

## 2015-12-21 MED ORDER — POTASSIUM CHLORIDE ER 10 MEQ PO TBCR: 10.0000 meq | EXTENDED_RELEASE_TABLET | Freq: Every day | ORAL | Status: DC

## 2015-12-21 NOTE — Telephone Encounter (Signed)
Pt notified of lab results and Dr. Tower's comments, Rx sent to pharmacy and lab appt scheduled 

## 2016-01-04 ENCOUNTER — Other Ambulatory Visit (INDEPENDENT_AMBULATORY_CARE_PROVIDER_SITE_OTHER): Payer: BC Managed Care – PPO

## 2016-01-04 DIAGNOSIS — E876 Hypokalemia: Secondary | ICD-10-CM

## 2016-01-04 LAB — POTASSIUM: Potassium: 3.5 mEq/L (ref 3.5–5.1)

## 2016-01-05 ENCOUNTER — Other Ambulatory Visit: Payer: BC Managed Care – PPO

## 2016-01-06 ENCOUNTER — Telehealth: Payer: Self-pay | Admitting: Family Medicine

## 2016-01-06 NOTE — Telephone Encounter (Signed)
Pt notified of lab results

## 2016-01-06 NOTE — Telephone Encounter (Signed)
Pt returned your call - please call - [phone] Thanks

## 2016-02-07 ENCOUNTER — Telehealth: Payer: Self-pay | Admitting: Family Medicine

## 2016-02-07 NOTE — Telephone Encounter (Signed)
Please let pt know I rec her note about the dental procedure and EKG The tracing (one lead) shows some skipped beats (called PACs or PVCs)- this is common and usually not concerning However I want to get a 12 lead EKG on her to make sure the whole EKG is ok -that would be reassuring Please ask her if she has palpitations at all (or chest pain or other symptoms) Come in for a nurse visit for 12 lead EKG please when able  Thanks

## 2016-02-07 NOTE — Telephone Encounter (Signed)
Left voicemail requesting pt to call office back  Per Barnett Applebaum we can schedule a nurse visit to do an EKG but it has to be on a day Dr. Glori Bickers is here so if EKG is very abnormal she can advise pt on what to do next

## 2016-02-08 ENCOUNTER — Ambulatory Visit (INDEPENDENT_AMBULATORY_CARE_PROVIDER_SITE_OTHER): Payer: BC Managed Care – PPO | Admitting: Family Medicine

## 2016-02-08 DIAGNOSIS — R9431 Abnormal electrocardiogram [ECG] [EKG]: Secondary | ICD-10-CM | POA: Diagnosis not present

## 2016-02-08 NOTE — Telephone Encounter (Signed)
Pt did advise me that she doesn't have any palpitations or SOB or any other sxs

## 2016-02-08 NOTE — Telephone Encounter (Signed)
Pt notified of Dr. Marliss Coots comments and appt scheduled for EKG today

## 2016-02-08 NOTE — Progress Notes (Signed)
EKG only. Dr. Glori Bickers cleared patient.

## 2016-02-08 NOTE — Telephone Encounter (Signed)
Patient returned Shapale's call.  Patient can be reached at [phone] until 8:30.  After 8:30, patient can be reached at [phone] number.

## 2016-05-02 ENCOUNTER — Ambulatory Visit (INDEPENDENT_AMBULATORY_CARE_PROVIDER_SITE_OTHER): Payer: BC Managed Care – PPO | Admitting: Family Medicine

## 2016-05-02 ENCOUNTER — Telehealth: Payer: Self-pay | Admitting: Family Medicine

## 2016-05-02 ENCOUNTER — Encounter: Payer: Self-pay | Admitting: Family Medicine

## 2016-05-02 ENCOUNTER — Ambulatory Visit (INDEPENDENT_AMBULATORY_CARE_PROVIDER_SITE_OTHER)
Admission: RE | Admit: 2016-05-02 | Discharge: 2016-05-02 | Disposition: A | Discharge status: Home / Self Care | Payer: BC Managed Care – PPO | Source: Ambulatory Visit | Attending: Family Medicine | Admitting: Family Medicine

## 2016-05-02 VITALS — BP 118/60 | HR 83 | Temp 98.7°F | Ht 66.0 in | Wt 222.5 lb

## 2016-05-02 DIAGNOSIS — R0789 Other chest pain: Secondary | ICD-10-CM | POA: Diagnosis not present

## 2016-05-02 NOTE — Telephone Encounter (Signed)
Left voicemail letting pt know we can see her today at 4:15pm but if sxs worsen in the mean time to go to ER, I requested pt to call office and confirm she will be here at 4:15 before I add her to our schedule

## 2016-05-02 NOTE — Telephone Encounter (Signed)
Pt only wants to be seen by Dr Fran Lowes advise if you are okay with adding pt to your schedule or what would you suggest

## 2016-05-02 NOTE — Telephone Encounter (Signed)
Patient returned Jennifer Rose's call.  She scheduled appointment at 4:15.  I added patient to the schedule.

## 2016-05-02 NOTE — Telephone Encounter (Signed)
Patient Name: Jennifer Rose  DOB: September 25, 1956    Initial Comment Caller states she has Lupus. She's having discomfort under her left breast.   Nurse Assessment  Nurse: Raphael Gibney, RN, Vanita Ingles Date/Time (Eastern Time): 05/02/2016 8:21:45 AM  Confirm and document reason for call. If symptomatic, describe symptoms. You must click the next button to save text entered. ---Caller states she has lupus. has pain under her left breast that comes and goes. No pain now.  Has the patient traveled out of the country within the last 30 days? ---Not Applicable  Does the patient have any new or worsening symptoms? ---Yes  Will a triage be completed? ---Yes  Related visit to physician within the last 2 weeks? ---No  Does the PT have any chronic conditions? (i.e. diabetes, asthma, etc.) ---Yes  List chronic conditions. ---lupus, HTN  Is this a behavioral health or substance abuse call? ---No     Guidelines    Guideline Title Affirmed Question Affirmed Notes  Chest Pain [1] Chest pain lasting <= 5 minutes AND [2] NO chest pain or cardiac symptoms now (Exceptions: pains lasting a few seconds)    Final Disposition User   See Physician within 24 Hours Stringer, RN, Vera    Comments  No appts available with Dr. Glori Bickers today. Pt does not want to see another doctor. please call pt back regarding appt.   Referrals  REFERRED TO PCP OFFICE   Disagree/Comply: Comply

## 2016-05-02 NOTE — Progress Notes (Signed)
Pre visit review using our clinic review tool, if applicable. No additional management support is needed unless otherwise documented below in the visit note. 

## 2016-05-02 NOTE — Progress Notes (Signed)
Subjective:    Patient ID: Jennifer Rose, female    DOB: 12-19-1955, 60 y.o.   MRN: [id number]  HPI Here for discomfort under L breast  She thinks it may be related to lupus  Has had more aches and pains lately   Discomfort under L breast - comes and goes -feels like gas  Gets better when she belches  A little digestive discomfort  New- honey/unpasturized vinegar and water and lemon juice-supposed to help her loose wt  She stopped that (had done for 3 d)  No sob  No DOE No orthopnea or PND   Both shoulders and elbows hurt but no particular radiation to arm or neck   On plaquenil for lupus    EKG is unchanged today NSR with rate of 82 and some T wave inv   bmi is 35.9 Wt Readings from Last 3 Encounters:  05/02/16 222 lb 8 oz (100.925 kg)  12/16/15 224 lb (101.606 kg)  05/23/15 222 lb (100.699 kg)   BP Readings from Last 3 Encounters:  05/02/16 118/60  12/16/15 116/68  05/23/15 118/72    Patient Active Problem List   Diagnosis Date Noted  . Chest wall pain 05/02/2016  . Hyperglycemia 12/13/2014  . Obesity 12/13/2014  . Acute sinusitis 03/23/2013  . Migraine headache 03/23/2013  . Swelling, mass, or lump in chest 05/28/2011  . CERUMEN IMPACTION, LEFT 08/30/2009  . GLAUCOMA 04/15/2009  . VERTIGO 02/16/2009  . LUPUS 11/09/2008  . Allergic rhinitis, cause unspecified 09/24/2007  . Essential hypertension 03/22/2004   Past Medical History  Diagnosis Date  . Hypertension 03/22/2004  . Allergy     allergic rhinitis  . Lupus ([place])   . Uterine fibroid 11/01    per GYN   Past Surgical History  Procedure Laterality Date  . Ccy    . Endometrial ablation  04/03    polyps?DUB   Social History  Substance Use Topics  . Smoking status: Never Smoker   . Smokeless tobacco: None  . Alcohol Use: No   No family history on file. No Known Allergies Current Outpatient Prescriptions on File Prior to Visit  Medication Sig Dispense Refill  . amLODipine (NORVASC) 5 MG  tablet Take 1 tablet (5 mg total) by mouth daily. 30 tablet 11  . aspirin 81 MG tablet Take 81 mg by mouth daily.      . cholecalciferol (VITAMIN D) 1000 UNITS tablet Take 2,000 Units by mouth daily.      . hydroxychloroquine (PLAQUENIL) 200 MG tablet Take 200 mg by mouth 2 (two) times daily.      Marland Kitchen latanoprost (XALATAN) 0.005 % ophthalmic solution Place 1 drop into both eyes as directed.      Marland Kitchen losartan-hydrochlorothiazide (HYZAAR) 50-12.5 MG tablet Take 1 tablet by mouth daily. 30 tablet 11  . meclizine (ANTIVERT) 12.5 MG tablet Take 12.5 mg by mouth 3 (three) times daily as needed.      . potassium chloride (K-DUR) 10 MEQ tablet Take 1 tablet (10 mEq total) by mouth daily. 30 tablet 11   No current facility-administered medications on file prior to visit.     Review of Systems Review of Systems  Constitutional: Negative for fever, appetite change, fatigue and unexpected weight change.  Eyes: Negative for pain and visual disturbance.  Respiratory: Negative for cough and shortness of breath.  pos for chest wall pain and tenderness  Cardiovascular: Negative for pedal edema/PND/orthopnea or palpitations    Gastrointestinal: Negative for nausea, diarrhea and  constipation.  Genitourinary: Negative for urgency and frequency.  Skin: Negative for pallor or rash   Neurological: Negative for weakness, light-headedness, numbness and headaches.  Hematological: Negative for adenopathy. Does not bruise/bleed easily.  Psychiatric/Behavioral: Negative for dysphoric mood. The patient is not nervous/anxious.         Objective:   Physical Exam  Constitutional: She appears well-developed and well-nourished. No distress.  obese and well appearing   HENT:  Head: Normocephalic and atraumatic.  Mouth/Throat: Oropharynx is clear and moist.  Eyes: Conjunctivae and EOM are normal. Pupils are equal, round, and reactive to light.  Neck: Normal range of motion. Neck supple. No JVD present. Carotid bruit is  not present. No thyromegaly present.  Cardiovascular: Normal rate, regular rhythm, normal heart sounds and intact distal pulses.  Exam reveals no gallop.   Pulmonary/Chest: Effort normal and breath sounds normal. No respiratory distress. She has no wheezes. She has no rales. She exhibits tenderness.  No crackles or rales or wheeze  Tender over L lower anterior ribs w/o crepitus or skin change   Abdominal: Soft. Bowel sounds are normal. She exhibits no distension, no abdominal bruit and no mass. There is no tenderness. There is no rebound and no guarding.  Musculoskeletal: She exhibits no edema.  Lymphadenopathy:    She has no cervical adenopathy.  Neurological: She is alert. She has normal reflexes.  Skin: Skin is warm and dry. No rash noted.  Psychiatric: She has a normal mood and affect.          Assessment & Plan:   Problem List Items Addressed This Visit      Other   Chest wall pain - Primary    Pt has L sided chest wall pain with reassuring labs and exam  Is tender over ribs-will get cxr today  Has lupus - will watch closely  Also had been taking a cleanse with highly acidic beverage-this may have caused stomach discomfort inst to stop this Try aleve bid with food prn  Warm compress to tender area Pending cxr reading Update if not starting to improve in a week or if worsening        Relevant Orders   DG Chest 2 View (Completed)    Other Visit Diagnoses    Chest discomfort        Relevant Orders    EKG 12-Lead (Completed)

## 2016-05-02 NOTE — Patient Instructions (Addendum)
I think your chest wall pain is coming from some inflammation in ribs and chest wall Exam and EKG are reassuring Chest xray now  Use a warm compress on your chest over area it hurts  Stop the cleanse fluid Try aleve 1-2 pills twice daily with meal as needed - this may also help (if it makes it worse, stop it) Update if not starting to improve in a week or if worsening   Is severe pain or short of breath-go to the ED

## 2016-05-02 NOTE — Telephone Encounter (Signed)
Please put her in my 4:15 spot today if possible and get EKG on the way in If her symptoms suddenly worsen before that - tell her to go to ED

## 2016-05-03 NOTE — Assessment & Plan Note (Signed)
Pt has L sided chest wall pain with reassuring labs and exam  Is tender over ribs-will get cxr today  Has lupus - will watch closely  Also had been taking a cleanse with highly acidic beverage-this may have caused stomach discomfort inst to stop this Try aleve bid with food prn  Warm compress to tender area Pending cxr reading Update if not starting to improve in a week or if worsening

## 2016-05-28 ENCOUNTER — Other Ambulatory Visit: Payer: Self-pay | Admitting: Family Medicine

## 2016-05-28 DIAGNOSIS — Z1231 Encounter for screening mammogram for malignant neoplasm of breast: Secondary | ICD-10-CM

## 2016-06-26 ENCOUNTER — Ambulatory Visit
Admission: RE | Admit: 2016-06-26 | Discharge: 2016-06-26 | Disposition: A | Discharge status: Home / Self Care | Payer: BC Managed Care – PPO | Source: Ambulatory Visit | Attending: Family Medicine | Admitting: Family Medicine

## 2016-06-26 DIAGNOSIS — Z1231 Encounter for screening mammogram for malignant neoplasm of breast: Secondary | ICD-10-CM

## 2016-06-26 LAB — HM MAMMOGRAPHY

## 2016-06-28 ENCOUNTER — Encounter: Payer: Self-pay | Admitting: *Deleted

## 2016-07-23 LAB — HM PAP SMEAR: HM Pap smear: NORMAL

## 2016-10-11 ENCOUNTER — Encounter: Payer: Self-pay | Admitting: Family Medicine

## 2016-12-07 ENCOUNTER — Encounter: Payer: Self-pay | Admitting: Family Medicine

## 2016-12-07 ENCOUNTER — Ambulatory Visit (INDEPENDENT_AMBULATORY_CARE_PROVIDER_SITE_OTHER): Payer: BC Managed Care – PPO | Admitting: Family Medicine

## 2016-12-07 DIAGNOSIS — J0100 Acute maxillary sinusitis, unspecified: Secondary | ICD-10-CM | POA: Diagnosis not present

## 2016-12-07 MED ORDER — AMOXICILLIN-POT CLAVULANATE 875-125 MG PO TABS: 1.0000 | ORAL_TABLET | Freq: Two times a day (BID) | ORAL | 0 refills | Status: DC

## 2016-12-07 NOTE — Patient Instructions (Signed)
Start augmentin and try to get some rest.   Drink plenty of fluids.  Take care.  Glad to see you.

## 2016-12-07 NOTE — Progress Notes (Signed)
Sx started about a few weeks ago.  She was trying to let it run its course.  More facial pain in the meantime.  Frontal pain.  Maxillary pain.  Nasal congestion.  Discolored rhinorrhea.  Some occ R ear pain.  No vomiting except for this AM when nausea from post nasal gtt.  She can still get a good deep breath.  Mild cough.    H/o lupus noted.    Meds, vitals, and allergies reviewed.   ROS: Per HPI unless specifically indicated in ROS section   GEN: nad, alert and oriented HEENT: mucous membranes moist, tm w/o erythema, nasal exam w/o erythema, clear discharge noted,  OP with cobblestoning, sinuses ttp x 4.  NECK: supple w/o LA CV: rrr.   PULM: ctab, no inc wob EXT: no edema

## 2016-12-07 NOTE — Assessment & Plan Note (Signed)
Nontoxic, sinuses ttpx4, okay for outpatient f/u.  Start augmentin.  She agrees. Supportive care.

## 2016-12-10 ENCOUNTER — Ambulatory Visit: Payer: BC Managed Care – PPO | Admitting: Family Medicine

## 2017-02-12 ENCOUNTER — Encounter: Payer: Self-pay | Admitting: Family Medicine

## 2017-02-12 ENCOUNTER — Ambulatory Visit (INDEPENDENT_AMBULATORY_CARE_PROVIDER_SITE_OTHER): Payer: BC Managed Care – PPO | Admitting: Family Medicine

## 2017-02-12 DIAGNOSIS — R0981 Nasal congestion: Secondary | ICD-10-CM | POA: Diagnosis not present

## 2017-02-12 MED ORDER — FEXOFENADINE HCL 180 MG PO TABS: 180.0000 mg | ORAL_TABLET | Freq: Every day | ORAL | 1 refills | Status: DC

## 2017-02-12 MED ORDER — FLUTICASONE PROPIONATE 50 MCG/ACT NA SUSP: 1.0000 | Freq: Every day | NASAL | 1 refills | Status: DC

## 2017-02-12 MED ORDER — FLUTICASONE PROPIONATE 50 MCG/ACT NA SUSP: 1.0000 | Freq: Every day | NASAL | 2 refills | Status: DC

## 2017-02-12 MED ORDER — AMOXICILLIN-POT CLAVULANATE 875-125 MG PO TABS: 1.0000 | ORAL_TABLET | Freq: Two times a day (BID) | ORAL | 0 refills | Status: DC

## 2017-02-12 MED ORDER — FEXOFENADINE HCL 180 MG PO TABS: 180.0000 mg | ORAL_TABLET | Freq: Every day | ORAL | Status: DC

## 2017-02-12 MED ORDER — ALBUTEROL SULFATE HFA 108 (90 BASE) MCG/ACT IN AERS: 2.0000 | INHALATION_SPRAY | Freq: Four times a day (QID) | RESPIRATORY_TRACT | 0 refills | Status: DC | PRN

## 2017-02-12 NOTE — Patient Instructions (Signed)
Use allegra and albuterol for now.  Add on flonase if needed.  Start augmentin if you have fevers or more sinus pain.  Take care.  Glad to see you.  Update [place] as needed.

## 2017-02-12 NOTE — Progress Notes (Signed)
Sx for about 1.5-2 weeks.   Unclear how much allergies/pollen contribute.   Runny nose, eye watering.  Now with a cough.   No sputum.  No fevers documented, but may have had a fever last night.  Felt hot last night.  Some wheeze.  Never had to use inhaler prev.   No vomiting, no diarrhea.   Mildly stuffy now, not severe.   Now with ST today, ears are painful today.    Prev illness from 11/2016 had resolved.    Per HPI unless specifically indicated in ROS section   Meds, vitals, and allergies reviewed.   GEN: nad, alert and oriented HEENT: mucous membranes moist, TM w/o erythema, nasal epithelium injected, OP with cobblestoning, sinuses minimally ttp NECK: supple w/o LA CV: rrr. PULM: ctab, no inc wob ABD: soft, +bs EXT: no edema

## 2017-02-12 NOTE — Progress Notes (Signed)
Pre visit review using our clinic review tool, if applicable. No additional management support is needed unless otherwise documented below in the visit note. 

## 2017-02-13 DIAGNOSIS — R0981 Nasal congestion: Secondary | ICD-10-CM | POA: Insufficient documentation

## 2017-02-13 NOTE — Assessment & Plan Note (Signed)
Discussed with patient. I think this is more likely to be from seasonal allergies. Reasonable to use Allegra. Can use albuterol as needed. If still having nasal symptoms can add on Flonase. I think she is likely not going to develop sinus infection but I printed a prescription for Augmentin for her to hold in the meantime. She is reliable and understood the plan.Marland Kitchen She was not would use this unless she had fevers or more facial pain or symptoms that were suggestive of sinus infection. She agrees. Update [place] as needed. Nontoxic.

## 2017-03-21 ENCOUNTER — Other Ambulatory Visit: Payer: Self-pay | Admitting: Family Medicine

## 2017-03-21 NOTE — Telephone Encounter (Signed)
Please schedule a July f/u and refill until then

## 2017-03-21 NOTE — Telephone Encounter (Signed)
Left voicemail requesting pt to call the office back and schedule a f/u appt

## 2017-03-21 NOTE — Telephone Encounter (Signed)
No recent f/u or CPE appts and no future appts., please advise

## 2017-03-27 NOTE — Telephone Encounter (Signed)
Pt scheduled an appt and med was filled

## 2017-04-30 ENCOUNTER — Encounter: Payer: Self-pay | Admitting: Family Medicine

## 2017-04-30 ENCOUNTER — Ambulatory Visit (INDEPENDENT_AMBULATORY_CARE_PROVIDER_SITE_OTHER): Payer: BC Managed Care – PPO | Admitting: Family Medicine

## 2017-04-30 VITALS — BP 124/76 | HR 56 | Temp 97.8°F | Ht 66.0 in | Wt 222.5 lb

## 2017-04-30 DIAGNOSIS — M3219 Other organ or system involvement in systemic lupus erythematosus: Secondary | ICD-10-CM

## 2017-04-30 DIAGNOSIS — D696 Thrombocytopenia, unspecified: Secondary | ICD-10-CM | POA: Insufficient documentation

## 2017-04-30 DIAGNOSIS — I10 Essential (primary) hypertension: Secondary | ICD-10-CM | POA: Diagnosis not present

## 2017-04-30 DIAGNOSIS — Z6835 Body mass index (BMI) 35.0-35.9, adult: Secondary | ICD-10-CM

## 2017-04-30 DIAGNOSIS — R739 Hyperglycemia, unspecified: Secondary | ICD-10-CM

## 2017-04-30 LAB — CBC WITH DIFFERENTIAL/PLATELET
BASOS PCT: 0.4 % (ref 0.0–3.0)
Basophils Absolute: 0 10*3/uL (ref 0.0–0.1)
EOS PCT: 1.5 % (ref 0.0–5.0)
Eosinophils Absolute: 0.1 10*3/uL (ref 0.0–0.7)
HCT: 38.3 % (ref 36.0–46.0)
HEMOGLOBIN: 12.7 g/dL (ref 12.0–15.0)
Lymphocytes Relative: 25.4 % (ref 12.0–46.0)
Lymphs Abs: 1.3 10*3/uL (ref 0.7–4.0)
MCHC: 33 g/dL (ref 30.0–36.0)
MCV: 80 fl (ref 78.0–100.0)
MONO ABS: 0.5 10*3/uL (ref 0.1–1.0)
Monocytes Relative: 10 % (ref 3.0–12.0)
Neutro Abs: 3.1 10*3/uL (ref 1.4–7.7)
Neutrophils Relative %: 62.7 % (ref 43.0–77.0)
Platelets: 117 10*3/uL — ABNORMAL LOW (ref 150.0–400.0)
RBC: 4.79 Mil/uL (ref 3.87–5.11)
RDW: 13.5 % (ref 11.5–15.5)
WBC: 5 10*3/uL (ref 4.0–10.5)

## 2017-04-30 LAB — COMPREHENSIVE METABOLIC PANEL
ALT: 24 U/L (ref 0–35)
AST: 23 U/L (ref 0–37)
Albumin: 3.9 g/dL (ref 3.5–5.2)
Alkaline Phosphatase: 86 U/L (ref 39–117)
BUN: 15 mg/dL (ref 6–23)
CHLORIDE: 104 meq/L (ref 96–112)
CO2: 28 meq/L (ref 19–32)
CREATININE: 0.99 mg/dL (ref 0.40–1.20)
Calcium: 9.6 mg/dL (ref 8.4–10.5)
GFR: 73.81 mL/min (ref >60.00)
Glucose, Bld: 124 mg/dL — ABNORMAL HIGH (ref 70–99)
Potassium: 3.8 mEq/L (ref 3.5–5.1)
SODIUM: 140 meq/L (ref 135–145)
Total Bilirubin: 0.4 mg/dL (ref 0.2–1.2)
Total Protein: 7.3 g/dL (ref 6.0–8.3)

## 2017-04-30 LAB — LIPID PANEL
CHOL/HDL RATIO: 3
Cholesterol: 137 mg/dL (ref 0–200)
HDL: 40.2 mg/dL (ref >39.00)
LDL CALC: 80 mg/dL (ref 0–99)
NonHDL: 96.34
TRIGLYCERIDES: 81 mg/dL (ref 0.0–149.0)
VLDL: 16.2 mg/dL (ref 0.0–40.0)

## 2017-04-30 LAB — HEMOGLOBIN A1C: HEMOGLOBIN A1C: 6.3 % (ref 4.6–6.5)

## 2017-04-30 LAB — TSH: TSH: 2.47 u[IU]/mL (ref 0.35–4.50)

## 2017-04-30 MED ORDER — POTASSIUM CHLORIDE ER 10 MEQ PO TBCR: 10.0000 meq | EXTENDED_RELEASE_TABLET | Freq: Every day | ORAL | 11 refills | Status: DC

## 2017-04-30 MED ORDER — LOSARTAN POTASSIUM-HCTZ 50-12.5 MG PO TABS: 1.0000 | ORAL_TABLET | Freq: Every day | ORAL | 11 refills | Status: DC

## 2017-04-30 MED ORDER — AMLODIPINE BESYLATE 5 MG PO TABS: 5.0000 mg | ORAL_TABLET | Freq: Every day | ORAL | 11 refills | Status: DC

## 2017-04-30 NOTE — Assessment & Plan Note (Signed)
Discussed how this problem influences overall health and the risks it imposes  Reviewed plan for weight loss with lower calorie diet (via better food choices and also portion control or program like weight watchers) and exercise building up to or more than 30 minutes 5 days per week including some aerobic activity    

## 2017-04-30 NOTE — Patient Instructions (Addendum)
If you find out when your last tetanus shot, let us know   Take care of yourself Keep working on diet and exercise  Labs today

## 2017-04-30 NOTE — Assessment & Plan Note (Signed)
bp in fair control at this time  BP Readings from Last 1 Encounters:  04/30/17 124/76   No changes needed Disc lifstyle change with low sodium diet and exercise  Labs today  Wt loss enc

## 2017-04-30 NOTE — Assessment & Plan Note (Signed)
A1C today with labs Pt has cut out soda and cut back on bread and fried food disc imp of low glycemic diet and wt loss to prevent DM2

## 2017-04-30 NOTE — Progress Notes (Signed)
Subjective:    Patient ID: Jennifer Rose, female    DOB: 08-28-1956, 61 y.o.   MRN: [id number]  HPI Here for f/u of chronic medical problems   Doing well overall/ excited to get out of school   Wt Readings from Last 3 Encounters:  04/30/17 222 lb 8 oz (100.9 kg)  02/12/17 226 lb 8 oz (102.7 kg)  12/07/16 225 lb (102.1 kg)  walking some more  Eating is ok/ about the same  bmi 35.9  bp is stable today - no problems at home  No cp or palpitations or headaches or edema  No side effects to medicines  BP Readings from Last 3 Encounters:  04/30/17 124/76  02/12/17 134/68  12/07/16 128/80    On amlodipine and losartan hct with K  Due for labs   Hx of hyperglycemia Lab Results  Component Value Date   HGBA1C 6.1 12/19/2015  she watches sugar/carbs- does not buy sodas and has cut back on bread and fried foods   Due for labs  Last cholesterol    Lab Results  Component Value Date   CHOL 130 12/16/2015   HDL 40.80 12/16/2015   LDLCALC 78 12/16/2015   TRIG 57.0 12/16/2015   CHOLHDL 3 12/16/2015   Hx of Lupus-no flares lately-just some chronic joint pain / has to push through Still on plaquenil    Patient Active Problem List   Diagnosis Date Noted  . Hyperglycemia 12/13/2014  . Obesity 12/13/2014  . Migraine headache 03/23/2013  . Swelling, mass, or lump in chest 05/28/2011  . CERUMEN IMPACTION, LEFT 08/30/2009  . GLAUCOMA 04/15/2009  . VERTIGO 02/16/2009  . LUPUS 11/09/2008  . Allergic rhinitis, cause unspecified 09/24/2007  . Essential hypertension 03/22/2004   Past Medical History:  Diagnosis Date  . Allergy    allergic rhinitis  . Hypertension 03/22/2004  . Lupus   . Uterine fibroid 11/01   per GYN   Past Surgical History:  Procedure Laterality Date  . ccy    . ENDOMETRIAL ABLATION  04/03   polyps?DUB   Social History  Substance Use Topics  . Smoking status: Never Smoker  . Smokeless tobacco: Never Used  . Alcohol use No   No family history on  file. No Known Allergies Current Outpatient Prescriptions on File Prior to Visit  Medication Sig Dispense Refill  . aspirin 81 MG tablet Take 81 mg by mouth daily.      . cholecalciferol (VITAMIN D) 1000 UNITS tablet Take 2,000 Units by mouth daily.      . fexofenadine (ALLEGRA ALLERGY) 180 MG tablet Take 1 tablet (180 mg total) by mouth daily. 30 tablet 1  . fluticasone (FLONASE) 50 MCG/ACT nasal spray Place 1-2 sprays into both nostrils daily. 16 g 1  . hydroxychloroquine (PLAQUENIL) 200 MG tablet Take 200 mg by mouth 2 (two) times daily.      Marland Kitchen latanoprost (XALATAN) 0.005 % ophthalmic solution Place 1 drop into both eyes as directed.      . meclizine (ANTIVERT) 12.5 MG tablet Take 12.5 mg by mouth 3 (three) times daily as needed.       No current facility-administered medications on file prior to visit.     Review of Systems Review of Systems  Constitutional: Negative for fever, appetite change, fatigue and unexpected weight change.  Eyes: Negative for pain and visual disturbance.  Respiratory: Negative for cough and shortness of breath.   Cardiovascular: Negative for cp or palpitations    Gastrointestinal:  Negative for nausea, diarrhea and constipation.  Genitourinary: Negative for urgency and frequency.  Skin: Negative for pallor or rash   MSK pos for joint pain  Neurological: Negative for weakness, light-headedness, numbness and headaches.  Hematological: Negative for adenopathy. Does not bruise/bleed easily.  Psychiatric/Behavioral: Negative for dysphoric mood. The patient is not nervous/anxious.         Objective:   Physical Exam  Constitutional: She appears well-developed and well-nourished. No distress.  obese and well appearing   HENT:  Head: Normocephalic and atraumatic.  Mouth/Throat: Oropharynx is clear and moist.  Eyes: Conjunctivae and EOM are normal. Pupils are equal, round, and reactive to light.  Neck: Normal range of motion. Neck supple. No JVD present.  Carotid bruit is not present. No thyromegaly present.  Cardiovascular: Normal rate, regular rhythm, normal heart sounds and intact distal pulses.  Exam reveals no gallop.   Pulmonary/Chest: Effort normal and breath sounds normal. No respiratory distress. She has no wheezes. She has no rales.  No crackles  Abdominal: Soft. Bowel sounds are normal. She exhibits no distension, no abdominal bruit and no mass. There is no tenderness.  Musculoskeletal: She exhibits no edema.  No acute joint changes   Lymphadenopathy:    She has no cervical adenopathy.  Neurological: She is alert. She has normal reflexes.  Skin: Skin is warm and dry. No rash noted. No pallor.  Psychiatric: She has a normal mood and affect.          Assessment & Plan:   Problem List Items Addressed This Visit      Cardiovascular and Mediastinum   Essential hypertension - Primary    bp in fair control at this time  BP Readings from Last 1 Encounters:  04/30/17 124/76   No changes needed Disc lifstyle change with low sodium diet and exercise  Labs today  Wt loss enc      Relevant Medications   amLODipine (NORVASC) 5 MG tablet   losartan-hydrochlorothiazide (HYZAAR) 50-12.5 MG tablet   Other Relevant Orders   CBC with Differential/Platelet   Comprehensive metabolic panel   Lipid panel   TSH     Other   Hyperglycemia    A1C today with labs Pt has cut out soda and cut back on bread and fried food disc imp of low glycemic diet and wt loss to prevent DM2        Relevant Orders   Hemoglobin A1c   LUPUS    Stable/under care of rheumatology On Plaquenil      Obesity    Discussed how this problem influences overall health and the risks it imposes  Reviewed plan for weight loss with lower calorie diet (via better food choices and also portion control or program like weight watchers) and exercise building up to or more than 30 minutes 5 days per week including some aerobic activity

## 2017-04-30 NOTE — Assessment & Plan Note (Signed)
Stable/under care of rheumatology On Plaquenil

## 2017-05-20 ENCOUNTER — Other Ambulatory Visit: Payer: Self-pay | Admitting: Family Medicine

## 2017-05-20 DIAGNOSIS — Z1231 Encounter for screening mammogram for malignant neoplasm of breast: Secondary | ICD-10-CM

## 2017-06-27 ENCOUNTER — Ambulatory Visit
Admission: RE | Admit: 2017-06-27 | Discharge: 2017-06-27 | Disposition: A | Discharge status: Home / Self Care | Payer: BC Managed Care – PPO | Source: Ambulatory Visit | Attending: Family Medicine | Admitting: Family Medicine

## 2017-06-27 DIAGNOSIS — Z1231 Encounter for screening mammogram for malignant neoplasm of breast: Secondary | ICD-10-CM

## 2017-10-27 ENCOUNTER — Telehealth: Payer: Self-pay | Admitting: Family Medicine

## 2017-10-27 DIAGNOSIS — R739 Hyperglycemia, unspecified: Secondary | ICD-10-CM

## 2017-10-27 DIAGNOSIS — D696 Thrombocytopenia, unspecified: Secondary | ICD-10-CM

## 2017-10-27 DIAGNOSIS — I10 Essential (primary) hypertension: Secondary | ICD-10-CM

## 2017-10-27 NOTE — Telephone Encounter (Signed)
-----   Message from Ellamae Sia sent at 10/23/2017 11:13 AM EST ----- Regarding: Lab orders for Friday, 1.11.19 Lab orders for a f/u appt

## 2017-11-01 ENCOUNTER — Other Ambulatory Visit: Payer: BC Managed Care – PPO

## 2017-11-05 ENCOUNTER — Ambulatory Visit: Payer: BC Managed Care – PPO | Admitting: Family Medicine

## 2017-11-13 ENCOUNTER — Ambulatory Visit: Payer: BC Managed Care – PPO | Admitting: Family Medicine

## 2017-11-15 ENCOUNTER — Other Ambulatory Visit (INDEPENDENT_AMBULATORY_CARE_PROVIDER_SITE_OTHER): Payer: BC Managed Care – PPO

## 2017-11-15 DIAGNOSIS — D696 Thrombocytopenia, unspecified: Secondary | ICD-10-CM | POA: Diagnosis not present

## 2017-11-15 DIAGNOSIS — R739 Hyperglycemia, unspecified: Secondary | ICD-10-CM

## 2017-11-15 DIAGNOSIS — I10 Essential (primary) hypertension: Secondary | ICD-10-CM | POA: Diagnosis not present

## 2017-11-15 LAB — CBC WITH DIFFERENTIAL/PLATELET
Basophils Absolute: 0 10*3/uL (ref 0.0–0.1)
Basophils Relative: 0.5 % (ref 0.0–3.0)
EOS PCT: 1.1 % (ref 0.0–5.0)
Eosinophils Absolute: 0 10*3/uL (ref 0.0–0.7)
HCT: 40.1 % (ref 36.0–46.0)
HEMOGLOBIN: 13.3 g/dL (ref 12.0–15.0)
Lymphocytes Relative: 26.8 % (ref 12.0–46.0)
Lymphs Abs: 1.2 10*3/uL (ref 0.7–4.0)
MCHC: 33.2 g/dL (ref 30.0–36.0)
MCV: 80.5 fl (ref 78.0–100.0)
MONOS PCT: 10.5 % (ref 3.0–12.0)
Monocytes Absolute: 0.5 10*3/uL (ref 0.1–1.0)
Neutro Abs: 2.7 10*3/uL (ref 1.4–7.7)
Neutrophils Relative %: 61.1 % (ref 43.0–77.0)
Platelets: 122 10*3/uL — ABNORMAL LOW (ref 150.0–400.0)
RBC: 4.99 Mil/uL (ref 3.87–5.11)
RDW: 14 % (ref 11.5–15.5)
WBC: 4.5 10*3/uL (ref 4.0–10.5)

## 2017-11-15 LAB — BASIC METABOLIC PANEL
BUN: 16 mg/dL (ref 6–23)
CHLORIDE: 103 meq/L (ref 96–112)
CO2: 29 meq/L (ref 19–32)
CREATININE: 0.99 mg/dL (ref 0.40–1.20)
Calcium: 9.3 mg/dL (ref 8.4–10.5)
GFR: 73.67 mL/min (ref >60.00)
Glucose, Bld: 127 mg/dL — ABNORMAL HIGH (ref 70–99)
Potassium: 3.6 mEq/L (ref 3.5–5.1)
Sodium: 140 mEq/L (ref 135–145)

## 2017-11-15 LAB — HEMOGLOBIN A1C: Hgb A1c MFr Bld: 6.6 % — ABNORMAL HIGH (ref 4.6–6.5)

## 2017-11-19 ENCOUNTER — Ambulatory Visit: Payer: BC Managed Care – PPO | Admitting: Family Medicine

## 2017-11-19 ENCOUNTER — Encounter: Payer: Self-pay | Admitting: Family Medicine

## 2017-11-19 VITALS — BP 110/72 | HR 67 | Temp 98.0°F | Ht 66.0 in | Wt 217.0 lb

## 2017-11-19 DIAGNOSIS — M3219 Other organ or system involvement in systemic lupus erythematosus: Secondary | ICD-10-CM

## 2017-11-19 DIAGNOSIS — R739 Hyperglycemia, unspecified: Secondary | ICD-10-CM | POA: Diagnosis not present

## 2017-11-19 DIAGNOSIS — I10 Essential (primary) hypertension: Secondary | ICD-10-CM | POA: Diagnosis not present

## 2017-11-19 DIAGNOSIS — Z6835 Body mass index (BMI) 35.0-35.9, adult: Secondary | ICD-10-CM | POA: Diagnosis not present

## 2017-11-19 DIAGNOSIS — D696 Thrombocytopenia, unspecified: Secondary | ICD-10-CM

## 2017-11-19 NOTE — Progress Notes (Signed)
Subjective:    Patient ID: Jennifer Rose, female    DOB: December 02, 1955, 62 y.o.   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  35.02 kg/m   bp is stable today  No cp or palpitations or headaches or edema  No side effects to medicines  BP Readings from Last 3 Encounters:  11/19/17 110/72  04/30/17 124/76  02/12/17 134/68      Hyperglycemia  Lab Results  Component Value Date   HGBA1C 6.6 (H) 11/15/2017  up from 6.3 Wants to work on diet/exercise in 3 months (wants to avoid medication)  fam hx of dm  Last eye exam - December (nl)  No excessive thirst or urination    Cholesterol Lab Results  Component Value Date   CHOL 137 04/30/2017   HDL 40.20 04/30/2017   LDLCALC 80 04/30/2017   TRIG 81.0 04/30/2017   CHOLHDL 3 04/30/2017   Low platelets Stable Lab Results  Component Value Date   WBC 4.5 11/15/2017   HGB 13.3 11/15/2017   HCT 40.1 11/15/2017   MCV 80.5 11/15/2017   PLT 122.0 (L) 11/15/2017     Patient Active Problem List   Diagnosis Date Noted  . Thrombocytopenia ([place]) 04/30/2017  . Hyperglycemia 12/13/2014  . Class 2 severe obesity due to excess calories with serious comorbidity and body mass index (BMI) of 35.0 to 35.9 in adult [facility]) 12/13/2014  . Migraine headache 03/23/2013  . Swelling, mass, or lump in chest 05/28/2011  . CERUMEN IMPACTION, LEFT 08/30/2009  . GLAUCOMA 04/15/2009  . VERTIGO 02/16/2009  . LUPUS 11/09/2008  . Allergic rhinitis, cause unspecified 09/24/2007  .  Essential hypertension 03/22/2004   Past Medical History:  Diagnosis Date  . Allergy    allergic rhinitis  . Hypertension 03/22/2004  . Lupus   . Uterine fibroid 11/01   per GYN   Past Surgical History:  Procedure Laterality Date  . BREAST CYST EXCISION Right 36 yrs ago   x2  . ccy    . ENDOMETRIAL ABLATION  04/03   polyps?DUB   Social History   Tobacco Use  . Smoking status: Never Smoker  . Smokeless tobacco: Never Used  Substance Use Topics  . Alcohol use: No    Alcohol/week: 0.0 oz  . Drug use: No   History reviewed. No pertinent family history. No Known Allergies Current Outpatient Medications on File Prior to Visit  Medication Sig Dispense Refill  . amLODipine (NORVASC) 5 MG tablet Take 1 tablet (5 mg total) by mouth daily. 30 tablet 11  . aspirin 81 MG tablet Take 81 mg by mouth daily.      . cholecalciferol (VITAMIN D) 1000 UNITS tablet Take 2,000 Units by mouth daily.      . fexofenadine (ALLEGRA ALLERGY) 180 MG tablet Take 1 tablet (180 mg total) by mouth daily. 30 tablet 1  . fluticasone (FLONASE) 50  MCG/ACT nasal spray Place 1-2 sprays into both nostrils daily. 16 g 1  . hydroxychloroquine (PLAQUENIL) 200 MG tablet Take 200 mg by mouth 2 (two) times daily.      Marland Kitchen latanoprost (XALATAN) 0.005 % ophthalmic solution Place 1 drop into both eyes as directed.      Marland Kitchen losartan-hydrochlorothiazide (HYZAAR) 50-12.5 MG tablet Take 1 tablet by mouth daily. 30 tablet 11  . meclizine (ANTIVERT) 12.5 MG tablet Take 12.5 mg by mouth 3 (three) times daily as needed.      . potassium chloride (K-DUR) 10 MEQ tablet Take 1 tablet (10 mEq total) by mouth daily. 30 tablet 11   No current facility-administered medications on file prior to visit.     Review of Systems  Constitutional: Positive for fatigue. Negative for activity change, appetite change, fever and unexpected weight change.  HENT: Negative for congestion, ear pain, rhinorrhea, sinus pressure and sore throat.   Eyes:  Negative for pain, redness and visual disturbance.  Respiratory: Negative for cough, shortness of breath and wheezing.   Cardiovascular: Negative for chest pain and palpitations.  Gastrointestinal: Negative for abdominal pain, blood in stool, constipation and diarrhea.  Endocrine: Negative for polydipsia and polyuria.  Genitourinary: Negative for dysuria, frequency and urgency.  Musculoskeletal: Positive for arthralgias. Negative for back pain and myalgias.  Skin: Negative for pallor and rash.  Allergic/Immunologic: Negative for environmental allergies.  Neurological: Negative for dizziness, syncope and headaches.  Hematological: Negative for adenopathy. Does not bruise/bleed easily.  Psychiatric/Behavioral: Negative for decreased concentration and dysphoric mood. The patient is not nervous/anxious.        Objective:   Physical Exam  Constitutional: She appears well-developed and well-nourished. No distress.  obese and well appearing   HENT:  Head: Normocephalic and atraumatic.  Mouth/Throat: Oropharynx is clear and moist.  Eyes: Conjunctivae and EOM are normal. Pupils are equal, round, and reactive to light.  Neck: Normal range of motion. Neck supple. No JVD present. Carotid bruit is not present. No thyromegaly present.  Cardiovascular: Normal rate, regular rhythm, normal heart sounds and intact distal pulses. Exam reveals no gallop.  Pulmonary/Chest: Effort normal and breath sounds normal. No respiratory distress. She has no wheezes. She has no rales.  No crackles  Abdominal: Soft. Bowel sounds are normal. She exhibits no distension, no abdominal bruit and no mass. There is no tenderness.  Musculoskeletal: She exhibits no edema.  Lymphadenopathy:    She has no cervical adenopathy.  Neurological: She is alert. She has normal reflexes.  Skin: Skin is warm and dry. No rash noted. No pallor.  Psychiatric: She has a normal mood and affect.          Assessment & Plan:   Problem  List Items Addressed This Visit      Cardiovascular and Mediastinum   Essential hypertension    bp in fair control at this time  BP Readings from Last 1 Encounters:  11/19/17 110/72   No changes needed Disc lifstyle change with low sodium diet and exercise  Labs reviewed         Other   Class 2 severe obesity due to excess calories with serious comorbidity and body mass index (BMI) of 35.0 to 35.9 in adult [facility])    Discussed how this problem influences overall health and the risks it imposes  Reviewed plan for weight loss with lower calorie diet (via better food choices and also portion control or program like weight watchers) and exercise building up to or more  than 30 minutes 5 days per week including some aerobic activity         Hyperglycemia - Primary    Lab Results  Component Value Date   HGBA1C 6.6 (H) 11/15/2017   Now in the early dm range Declines metformin (did enc her to try since it may help wt loss) Will work on low glycemic diet and exercise and f/u 3 mo Disc eye and foot care       LUPUS    Stable on plaquenil      Thrombocytopenia ([facility])    Stable and asymptomatic with pl of 122  No symptoms Pt has SLE

## 2017-11-19 NOTE — Patient Instructions (Addendum)
Avoid sweets and sugar drinks  Try to get most of your carbohydrates from produce (with the exception of white potatoes)  Eat less bread/pasta/rice/snack foods/cereals/sweets and other items from the middle of the grocery store (processed carbs)   Try to get exercise when you can   If you want to try metformin let us know   Follow up in 3 months

## 2017-11-21 ENCOUNTER — Telehealth: Payer: Self-pay | Admitting: Family Medicine

## 2017-11-21 MED ORDER — AMLODIPINE BESYLATE 5 MG PO TABS: 5.0000 mg | ORAL_TABLET | Freq: Every day | ORAL | 11 refills | Status: DC

## 2017-11-21 NOTE — Assessment & Plan Note (Signed)
bp in fair control at this time  BP Readings from Last 1 Encounters:  11/19/17 110/72   No changes needed Disc lifstyle change with low sodium diet and exercise  Labs reviewed

## 2017-11-21 NOTE — Assessment & Plan Note (Signed)
Stable on plaquenil

## 2017-11-21 NOTE — Assessment & Plan Note (Signed)
Lab Results  Component Value Date   HGBA1C 6.6 (H) 11/15/2017   Now in the early dm range Declines metformin (did enc her to try since it may help wt loss) Will work on low glycemic diet and exercise and f/u 3 mo Disc eye and foot care

## 2017-11-21 NOTE — Assessment & Plan Note (Signed)
Stable and asymptomatic with pl of 122  No symptoms Pt has SLE

## 2017-11-21 NOTE — Assessment & Plan Note (Signed)
Discussed how this problem influences overall health and the risks it imposes  Reviewed plan for weight loss with lower calorie diet (via better food choices and also portion control or program like weight watchers) and exercise building up to or more than 30 minutes 5 days per week including some aerobic activity    

## 2017-11-21 NOTE — Telephone Encounter (Signed)
[facility] called stating that Jennifer Rose has switched to them from [facility]. [name] is requesting for you to fax the prescription for Amlodipine 5 mg to them. Their fax # is [phone].

## 2017-11-21 NOTE — Telephone Encounter (Signed)
Pt seen 11/19/17; refilled per protocol to [facility] electronically.

## 2018-02-17 ENCOUNTER — Encounter: Payer: Self-pay | Admitting: Family Medicine

## 2018-02-17 ENCOUNTER — Ambulatory Visit: Payer: BC Managed Care – PPO | Admitting: Family Medicine

## 2018-02-17 VITALS — BP 118/70 | HR 62 | Temp 97.5°F | Ht 66.0 in | Wt 220.0 lb

## 2018-02-17 DIAGNOSIS — I10 Essential (primary) hypertension: Secondary | ICD-10-CM | POA: Diagnosis not present

## 2018-02-17 DIAGNOSIS — R739 Hyperglycemia, unspecified: Secondary | ICD-10-CM

## 2018-02-17 DIAGNOSIS — Z6835 Body mass index (BMI) 35.0-35.9, adult: Secondary | ICD-10-CM

## 2018-02-17 DIAGNOSIS — R7303 Prediabetes: Secondary | ICD-10-CM

## 2018-02-17 LAB — POCT GLYCOSYLATED HEMOGLOBIN (HGB A1C): Hemoglobin A1C: 6

## 2018-02-17 NOTE — Assessment & Plan Note (Signed)
bp is stable today  No cp or palpitations or headaches or edema  No side effects to medicines  BP Readings from Last 3 Encounters:  02/17/18 118/70  11/19/17 110/72  04/30/17 124/76

## 2018-02-17 NOTE — Patient Instructions (Signed)
Let's check your A1C today  We will get in contact with you  Take care of yourself  Keep watching the sugar and carbs Try to get most of your carbohydrates from produce (with the exception of white potatoes)  Eat less bread/pasta/rice/snack foods/cereals/sweets and other items from the middle of the grocery store (processed carbs)  Walk whenever possible

## 2018-02-17 NOTE — Assessment & Plan Note (Signed)
Discussed how this problem influences overall health and the risks it imposes  Reviewed plan for weight loss with lower calorie diet (via better food choices and also portion control or program like weight watchers) and exercise building up to or more than 30 minutes 5 days per week including some aerobic activity   A struggle with time to devote to self care in light of caring for elderly mother

## 2018-02-17 NOTE — Assessment & Plan Note (Signed)
A1C today  Wt not down but making better diet choices Exercises when she can

## 2018-02-17 NOTE — Progress Notes (Signed)
Subjective:    Patient ID: Jennifer Rose, female    DOB: [birth year], 62 y.o.   MRN: [id number]  HPI Here for f/u of chronic health problems   Feeling pretty good overall  Still caring for mother in a facility    Wt Readings from Last 3 Encounters:  02/17/18 220 lb (99.8 kg)  11/19/17 217 lb (98.4 kg)  04/30/17 222 lb 8 oz (100.9 kg)  up 3 lb  35.51 kg/m    bp is stable today  No cp or palpitations or headaches or edema  No side effects to medicines  BP Readings from Last 3 Encounters:  02/17/18 118/70  11/19/17 110/72  04/30/17 124/76    Takes amlodipine    Prediabetes Lab Results  Component Value Date   HGBA1C 6.6 (H) 11/15/2017  since then she is trying to exercise more (time is difficult)- walking at school right now (is exhausted at end of the day)- depends on her lupus  On good days she does extra  Cut back on bread and pasta  Water instead of sugary drinks  Watching sugar and carbs  Will re check this today    Patient Active Problem List   Diagnosis Date Noted  . Thrombocytopenia ([place]) 04/30/2017  . Hyperglycemia 12/13/2014  . Class 2 severe obesity due to excess calories with serious comorbidity and body mass index (BMI) of 35.0 to 35.9 in adult [facility]) 12/13/2014  . Migraine headache 03/23/2013  . Swelling, mass, or lump in chest 05/28/2011  . GLAUCOMA 04/15/2009  . LUPUS 11/09/2008  . Allergic rhinitis, cause unspecified 09/24/2007  . Essential hypertension 03/22/2004   Past Medical History:  Diagnosis Date  . Allergy    allergic rhinitis  . Hypertension 03/22/2004  . Lupus ([place])   . Uterine fibroid 11/01   per GYN   Past Surgical History:  Procedure Laterality Date  . BREAST CYST EXCISION Right 36 yrs ago   x2  . ccy    . ENDOMETRIAL ABLATION  04/03   polyps?DUB   Social History   Tobacco Use  . Smoking status: Never Smoker  . Smokeless tobacco: Never Used  Substance Use Topics  . Alcohol use: No    Alcohol/week: 0.0 oz  . Drug  use: No   No family history on file. No Known Allergies Current Outpatient Medications on File Prior to Visit  Medication Sig Dispense Refill  . amLODipine (NORVASC) 5 MG tablet Take 1 tablet (5 mg total) by mouth daily. 30 tablet 11  . aspirin 81 MG tablet Take 81 mg by mouth daily.      . cholecalciferol (VITAMIN D) 1000 UNITS tablet Take 2,000 Units by mouth daily.      . fexofenadine (ALLEGRA ALLERGY) 180 MG tablet Take 1 tablet (180 mg total) by mouth daily. 30 tablet 1  . fluticasone (FLONASE) 50 MCG/ACT nasal spray Place 1-2 sprays into both nostrils daily. 16 g 1  . hydroxychloroquine (PLAQUENIL) 200 MG tablet Take 200 mg by mouth 2 (two) times daily.      Marland Kitchen latanoprost (XALATAN) 0.005 % ophthalmic solution Place 1 drop into both eyes as directed.      Marland Kitchen losartan-hydrochlorothiazide (HYZAAR) 50-12.5 MG tablet Take 1 tablet by mouth daily. 30 tablet 11  . meclizine (ANTIVERT) 12.5 MG tablet Take 12.5 mg by mouth 3 (three) times daily as needed.      . potassium chloride (K-DUR) 10 MEQ tablet Take 1 tablet (10 mEq total) by mouth daily. [place]  tablet 11   No current facility-administered medications on file prior to visit.     Review of Systems  Constitutional: Positive for fatigue. Negative for activity change, appetite change, fever and unexpected weight change.       Fatigue from schedule   HENT: Negative for congestion, ear pain, rhinorrhea, sinus pressure and sore throat.   Eyes: Negative for pain, redness and visual disturbance.  Respiratory: Negative for cough, shortness of breath and wheezing.   Cardiovascular: Negative for chest pain and palpitations.  Gastrointestinal: Negative for abdominal pain, blood in stool, constipation and diarrhea.  Endocrine: Negative for polydipsia and polyuria.  Genitourinary: Negative for dysuria, frequency and urgency.  Musculoskeletal: Positive for arthralgias. Negative for back pain and myalgias.  Skin: Negative for pallor and rash.        Some soreness of skin by L shoulder blade-but no rash  Allergic/Immunologic: Negative for environmental allergies.  Neurological: Negative for dizziness, syncope and headaches.  Hematological: Negative for adenopathy. Does not bruise/bleed easily.  Psychiatric/Behavioral: Negative for decreased concentration and dysphoric mood. The patient is not nervous/anxious.        Stressors and caregiving       Objective:   Physical Exam  Constitutional: She appears well-developed and well-nourished. No distress.  obese and well appearing   HENT:  Head: Normocephalic and atraumatic.  Mouth/Throat: Oropharynx is clear and moist.  Eyes: Pupils are equal, round, and reactive to light. Conjunctivae and EOM are normal.  Neck: Normal range of motion. Neck supple. No JVD present. Carotid bruit is not present. No thyromegaly present.  Cardiovascular: Normal rate, regular rhythm, normal heart sounds and intact distal pulses. Exam reveals no gallop.  Pulmonary/Chest: Effort normal and breath sounds normal. No respiratory distress. She has no wheezes. She has no rales.  No crackles  Abdominal: Soft. Bowel sounds are normal. She exhibits no distension, no abdominal bruit and no mass. There is no tenderness.  Musculoskeletal: She exhibits no edema.  Lymphadenopathy:    She has no cervical adenopathy.  Neurological: She is alert. She has normal reflexes.  Skin: Skin is warm and dry. No rash noted.  No rash seen in area of irritation on back  Psychiatric: She has a normal mood and affect.          Assessment & Plan:   Problem List Items Addressed This Visit      Cardiovascular and Mediastinum   Essential hypertension    bp is stable today  No cp or palpitations or headaches or edema  No side effects to medicines  BP Readings from Last 3 Encounters:  02/17/18 118/70  11/19/17 110/72  04/30/17 124/76            Other   Class 2 severe obesity due to excess calories with serious comorbidity  and body mass index (BMI) of 35.0 to 35.9 in adult [facility])    Discussed how this problem influences overall health and the risks it imposes  Reviewed plan for weight loss with lower calorie diet (via better food choices and also portion control or program like weight watchers) and exercise building up to or more than 30 minutes 5 days per week including some aerobic activity   A struggle with time to devote to self care in light of caring for elderly mother       Prediabetes - Primary    A1C today  Wt not down but making better diet choices Exercises when she can

## 2018-02-19 ENCOUNTER — Telehealth: Payer: Self-pay | Admitting: Family Medicine

## 2018-02-19 NOTE — Telephone Encounter (Signed)
Copied from [place] [id number]. Topic: Quick Communication - Lab Results >> Feb 19, 2018 10:14 AM Yvette Rack wrote: Pt returning call for lab results. Pt would like call back before noon if possible.

## 2018-02-19 NOTE — Telephone Encounter (Signed)
See result notes. 

## 2018-04-04 ENCOUNTER — Encounter: Payer: Self-pay | Admitting: Family Medicine

## 2018-04-04 ENCOUNTER — Ambulatory Visit: Payer: BC Managed Care – PPO | Admitting: Family Medicine

## 2018-04-04 ENCOUNTER — Telehealth: Payer: Self-pay

## 2018-04-04 VITALS — BP 130/76 | HR 75 | Temp 98.1°F | Ht 66.0 in | Wt 216.0 lb

## 2018-04-04 DIAGNOSIS — J0100 Acute maxillary sinusitis, unspecified: Secondary | ICD-10-CM

## 2018-04-04 DIAGNOSIS — J019 Acute sinusitis, unspecified: Secondary | ICD-10-CM | POA: Insufficient documentation

## 2018-04-04 MED ORDER — AMOXICILLIN-POT CLAVULANATE 875-125 MG PO TABS: 1.0000 | ORAL_TABLET | Freq: Two times a day (BID) | ORAL | 0 refills | Status: DC

## 2018-04-04 MED ORDER — BENZONATATE 200 MG PO CAPS: 200.0000 mg | ORAL_CAPSULE | Freq: Three times a day (TID) | ORAL | 1 refills | Status: DC | PRN

## 2018-04-04 MED ORDER — LOSARTAN POTASSIUM-HCTZ 50-12.5 MG PO TABS: 1.0000 | ORAL_TABLET | Freq: Every day | ORAL | 3 refills | Status: DC

## 2018-04-04 NOTE — Telephone Encounter (Signed)
Copied from [place] [id number]. Topic: Quick Communication - See Telephone Encounter >> Apr 04, 2018  3:02 PM Hewitt Shorts wrote: Pt wanted to give Dr. Glori Bickers  the name of the store they talked about   [facility]   [place]

## 2018-04-04 NOTE — Progress Notes (Signed)
Subjective:    Patient ID: Jennifer Rose, female    DOB: 1956/05/12, 62 y.o.   MRN: [id number]  HPI Here for sinus symptoms   Thinks she has a bacterial infection   Bad cough - sometimes productive / nagging all night  No wheezing but feels congestion Congestion - nasal/ pale yellow and runny   Cheeks started hurting yesterday under eyes  A little bit of pressure above eyes   No fever   Throat- mildly sore  Ears -popping   Wt Readings from Last 3 Encounters:  04/04/18 216 lb (98 kg)  02/17/18 220 lb (99.8 kg)  11/19/17 217 lb (98.4 kg)   34.86 kg/m  No otc meds  flonase   Patient Active Problem List   Diagnosis Date Noted  . Acute sinusitis 04/04/2018  . Thrombocytopenia ([place]) 04/30/2017  . Prediabetes 12/13/2014  . Class 2 severe obesity due to excess calories with serious comorbidity and body mass index (BMI) of 35.0 to 35.9 in adult [facility]) 12/13/2014  . Migraine headache 03/23/2013  . Swelling, mass, or lump in chest 05/28/2011  . GLAUCOMA 04/15/2009  . LUPUS 11/09/2008  . Allergic rhinitis, cause unspecified 09/24/2007  . Essential hypertension 03/22/2004   Past Medical History:  Diagnosis Date  . Allergy    allergic rhinitis  . Hypertension 03/22/2004  . Lupus ([place])   . Uterine fibroid 11/01   per GYN   Past Surgical History:  Procedure Laterality Date  . BREAST CYST EXCISION Right 36 yrs ago   x2  . [facility]    . ENDOMETRIAL ABLATION  04/03   polyps?DUB   Social History   Tobacco Use  . Smoking status: Never Smoker  . Smokeless tobacco: Never Used  Substance Use Topics  . Alcohol use: No    Alcohol/week: 0.0 oz  . Drug use: No   No family history on file. No Known Allergies Current Outpatient Medications on File Prior to Visit  Medication Sig Dispense Refill  . amLODipine (NORVASC) 5 MG tablet Take 1 tablet (5 mg total) by mouth daily. 30 tablet 11  . aspirin 81 MG tablet Take 81 mg by mouth daily.      . cholecalciferol (VITAMIN D) 1000  UNITS tablet Take 2,000 Units by mouth daily.      . fexofenadine (ALLEGRA ALLERGY) 180 MG tablet Take 1 tablet (180 mg total) by mouth daily. 30 tablet 1  . fluticasone (FLONASE) 50 MCG/ACT nasal spray Place 1-2 sprays into both nostrils daily. 16 g 1  . hydroxychloroquine (PLAQUENIL) 200 MG tablet Take 200 mg by mouth 2 (two) times daily.      Marland Kitchen latanoprost (XALATAN) 0.005 % ophthalmic solution Place 1 drop into both eyes as directed.      . meclizine (ANTIVERT) 12.5 MG tablet Take 12.5 mg by mouth 3 (three) times daily as needed.      . potassium chloride (K-DUR) 10 MEQ tablet Take 1 tablet (10 mEq total) by mouth daily. 30 tablet 11   No current facility-administered medications on file prior to visit.      Review of Systems  Constitutional: Positive for appetite change. Negative for fatigue and fever.  HENT: Positive for congestion, ear pain, postnasal drip, rhinorrhea, sinus pressure and sore throat. Negative for nosebleeds.   Eyes: Negative for pain, redness and itching.  Respiratory: Positive for cough. Negative for shortness of breath and wheezing.   Cardiovascular: Negative for chest pain.  Gastrointestinal: Negative for abdominal pain, diarrhea, nausea and  vomiting.  Endocrine: Negative for polyuria.  Genitourinary: Negative for dysuria, frequency and urgency.  Musculoskeletal: Negative for arthralgias and myalgias.  Allergic/Immunologic: Negative for immunocompromised state.  Neurological: Positive for headaches. Negative for dizziness, tremors, syncope, weakness and numbness.  Hematological: Negative for adenopathy. Does not bruise/bleed easily.  Psychiatric/Behavioral: Negative for dysphoric mood. The patient is not nervous/anxious.        Objective:   Physical Exam  Constitutional: She appears well-developed and well-nourished. No distress.  overwt and well app  HENT:  Head: Normocephalic and atraumatic.  Right Ear: External ear normal.  Left Ear: External ear  normal.  Mouth/Throat: Oropharynx is clear and moist. No oropharyngeal exudate.  Nares are injected and congested  Bilateral maxillary sinus tenderness  Post nasal drip   Eyes: Pupils are equal, round, and reactive to light. Conjunctivae and EOM are normal. Right eye exhibits no discharge. Left eye exhibits no discharge.  Neck: Normal range of motion. Neck supple.  Cardiovascular: Normal rate and regular rhythm.  Pulmonary/Chest: Effort normal and breath sounds normal. No respiratory distress. She has no wheezes. She has no rales.  Lymphadenopathy:    She has no cervical adenopathy.  Neurological: She is alert. No cranial nerve deficit.  Skin: Skin is warm and dry. No rash noted.  Psychiatric: She has a normal mood and affect.  pleasant          Assessment & Plan:   Problem List Items Addressed This Visit      Respiratory   Acute sinusitis - Primary    Facial pain/congestion immunocomp px augmentin Disc symptomatic care - see instructions on AVS  Meds ordered this encounter  Medications  . amoxicillin-clavulanate (AUGMENTIN) 875-125 MG tablet    Sig: Take 1 tablet by mouth 2 (two) times daily.    Dispense:  14 tablet    Refill:  0  . benzonatate (TESSALON) 200 MG capsule    Sig: Take 1 capsule (200 mg total) by mouth 3 (three) times daily as needed for cough. Do not bite pill    Dispense:  30 capsule    Refill:  1  . losartan-hydrochlorothiazide (HYZAAR) 50-12.5 MG tablet    Sig: Take 1 tablet by mouth daily.    Dispense:  90 tablet    Refill:  3         Relevant Medications   amoxicillin-clavulanate (AUGMENTIN) 875-125 MG tablet   benzonatate (TESSALON) 200 MG capsule

## 2018-04-04 NOTE — Patient Instructions (Signed)
Take the augmentin for sinus infection   Drink fluids and rest  Try tessalon for cough  Also mucinex DM may help   Use nasal saline spray  Also continue flonase

## 2018-04-05 NOTE — Assessment & Plan Note (Signed)
Facial pain/congestion immunocomp px augmentin Disc symptomatic care - see instructions on AVS  Meds ordered this encounter  Medications  . amoxicillin-clavulanate (AUGMENTIN) 875-125 MG tablet    Sig: Take 1 tablet by mouth 2 (two) times daily.    Dispense:  14 tablet    Refill:  0  . benzonatate (TESSALON) 200 MG capsule    Sig: Take 1 capsule (200 mg total) by mouth 3 (three) times daily as needed for cough. Do not bite pill    Dispense:  30 capsule    Refill:  1  . losartan-hydrochlorothiazide (HYZAAR) 50-12.5 MG tablet    Sig: Take 1 tablet by mouth daily.    Dispense:  90 tablet    Refill:  3

## 2018-04-05 NOTE — Assessment & Plan Note (Deleted)
Purulent nasal d/c with facial pain in pt on plaquenil Cover with augmentin Disc symptomatic care - see instructions on AVS

## 2018-05-06 ENCOUNTER — Other Ambulatory Visit: Payer: Self-pay | Admitting: Family Medicine

## 2018-05-21 ENCOUNTER — Other Ambulatory Visit: Payer: Self-pay | Admitting: Obstetrics and Gynecology

## 2018-05-21 DIAGNOSIS — Z1231 Encounter for screening mammogram for malignant neoplasm of breast: Secondary | ICD-10-CM

## 2018-06-05 ENCOUNTER — Encounter: Payer: Self-pay | Admitting: Family Medicine

## 2018-06-05 ENCOUNTER — Ambulatory Visit: Payer: BC Managed Care – PPO | Admitting: Family Medicine

## 2018-06-05 VITALS — BP 116/78 | HR 59 | Temp 97.9°F | Ht 66.0 in | Wt 215.2 lb

## 2018-06-05 DIAGNOSIS — H6120 Impacted cerumen, unspecified ear: Secondary | ICD-10-CM | POA: Insufficient documentation

## 2018-06-05 DIAGNOSIS — H6122 Impacted cerumen, left ear: Secondary | ICD-10-CM

## 2018-06-05 DIAGNOSIS — J0100 Acute maxillary sinusitis, unspecified: Secondary | ICD-10-CM

## 2018-06-05 MED ORDER — FLUTICASONE PROPIONATE 50 MCG/ACT NA SUSP: 1.0000 | Freq: Every day | NASAL | 5 refills | Status: DC

## 2018-06-05 MED ORDER — AMOXICILLIN-POT CLAVULANATE 875-125 MG PO TABS: 1.0000 | ORAL_TABLET | Freq: Two times a day (BID) | ORAL | 0 refills | Status: DC

## 2018-06-05 NOTE — Patient Instructions (Signed)
Drink fluids  Take augmentin as directed for sinus infection  Nasal saline spray -as needed  Start flonase daily through the fall   For wax in L ear- use Debrox or hydrogen peroxide 1-2 times per week to loosen it up

## 2018-06-05 NOTE — Assessment & Plan Note (Signed)
L ear  Recommended use of debrox weekly to monthly  F/u for irrigation if needed Avoid Q tip use

## 2018-06-05 NOTE — Assessment & Plan Note (Signed)
In pt on Plaquenil for SLE  Cover with augmentin  Disc symptomatic care - see instructions on AVS  Continue flonase  Fluids/rest  Update if not starting to improve in a week or if worsening

## 2018-06-05 NOTE — Progress Notes (Signed)
Subjective:    Patient ID: Jennifer Rose, female    DOB: November 30, 1955, 62 y.o.   MRN: [id number]  HPI  Here for sinus symptoms   Was seen in June for sinus infection  tx with augmentin and tessalon for cough  She got better   Mother passed mid July  Got run down  Doing ok with grief   Symptoms started mid July - very hoarse voice and st  A week ago- noted she was congested and dizzy  Headache- over both sides of face esp on cheeks  Nasal d/c is light yellow to white A lot of drainage  Voice is improved Throat feels full/no longer sore  Ears ring -they do not hurt but do feel full  No cough  Elevated temp on and off - has sweats occ/ chills last Saturday   No meds otc  No allegra or flonase right now   Has a little bump on R post neck-not painful    Patient Active Problem List   Diagnosis Date Noted  . Cerumen impaction 06/05/2018  . Acute sinusitis 04/04/2018  . Thrombocytopenia ([place]) 04/30/2017  . Prediabetes 12/13/2014  . Class 2 severe obesity due to excess calories with serious comorbidity and body mass index (BMI) of 35.0 to 35.9 in adult [facility]) 12/13/2014  . Migraine headache 03/23/2013  . Swelling, mass, or lump in chest 05/28/2011  . GLAUCOMA 04/15/2009  . LUPUS 11/09/2008  . Allergic rhinitis, cause unspecified 09/24/2007  . Essential hypertension 03/22/2004   Past Medical History:  Diagnosis Date  . Allergy    allergic rhinitis  . Hypertension 03/22/2004  . Lupus ([place])   . Uterine fibroid 11/01   per GYN   Past Surgical History:  Procedure Laterality Date  . BREAST CYST EXCISION Right 36 yrs ago   x2  . [facility]    . ENDOMETRIAL ABLATION  04/03   polyps?DUB   Social History   Tobacco Use  . Smoking status: Never Smoker  . Smokeless tobacco: Never Used  Substance Use Topics  . Alcohol use: No    Alcohol/week: 0.0 standard drinks  . Drug use: No   History reviewed. No pertinent family history. No Known Allergies Current Outpatient Medications  on File Prior to Visit  Medication Sig Dispense Refill  . amLODipine (NORVASC) 5 MG tablet Take 1 tablet (5 mg total) by mouth daily. 30 tablet 11  . aspirin 81 MG tablet Take 81 mg by mouth daily.      . cholecalciferol (VITAMIN D) 1000 UNITS tablet Take 2,000 Units by mouth daily.      . fexofenadine (ALLEGRA ALLERGY) 180 MG tablet Take 1 tablet (180 mg total) by mouth daily. 30 tablet 1  . hydroxychloroquine (PLAQUENIL) 200 MG tablet Take 200 mg by mouth 2 (two) times daily.      Marland Kitchen latanoprost (XALATAN) 0.005 % ophthalmic solution Place 1 drop into both eyes as directed.      Marland Kitchen losartan-hydrochlorothiazide (HYZAAR) 50-12.5 MG tablet Take 1 tablet by mouth daily. 90 tablet 3  . meclizine (ANTIVERT) 12.5 MG tablet Take 12.5 mg by mouth 3 (three) times daily as needed.      . potassium chloride (K-DUR) 10 MEQ tablet TAKE 1 TABLET BY MOUTH DAILY 30 tablet 5   No current facility-administered medications on file prior to visit.      Review of Systems  Constitutional: Positive for appetite change. Negative for fatigue and fever.  HENT: Positive for congestion, ear pain, postnasal  drip, rhinorrhea, sinus pressure, sinus pain, sore throat and voice change. Negative for nosebleeds and sneezing.   Eyes: Negative for pain, redness and itching.  Respiratory: Negative for cough, shortness of breath and wheezing.   Cardiovascular: Negative for chest pain.  Gastrointestinal: Negative for abdominal pain, diarrhea, nausea and vomiting.  Endocrine: Negative for polyuria.  Genitourinary: Negative for dysuria, frequency and urgency.  Musculoskeletal: Negative for arthralgias and myalgias.  Allergic/Immunologic: Negative for immunocompromised state.  Neurological: Positive for headaches. Negative for dizziness, tremors, syncope, weakness and numbness.  Hematological: Negative for adenopathy. Does not bruise/bleed easily.  Psychiatric/Behavioral: Negative for dysphoric mood. The patient is not  nervous/anxious.        Objective:   Physical Exam  Constitutional: She appears well-developed and well-nourished. No distress.  obese and well appearing   HENT:  Head: Normocephalic and atraumatic.  Right Ear: External ear normal.  Mouth/Throat: Oropharynx is clear and moist. No oropharyngeal exudate.  Nares are injected and congested  Bilateral maxillary sinus tenderness  Post nasal drip   Cerumen impaction L ear canal/partial   Eyes: Pupils are equal, round, and reactive to light. Conjunctivae and EOM are normal. Right eye exhibits no discharge. Left eye exhibits no discharge.  Neck: Normal range of motion. Neck supple.  Cardiovascular: Normal rate and regular rhythm.  Pulmonary/Chest: Effort normal and breath sounds normal. No stridor. No respiratory distress. She has no wheezes. She has no rales.  Lymphadenopathy:    She has no cervical adenopathy.  Neurological: She is alert. No cranial nerve deficit.  Skin: Skin is warm and dry. No rash noted.  Psychiatric: She has a normal mood and affect.          Assessment & Plan:   Problem List Items Addressed This Visit      Respiratory   Acute sinusitis - Primary    In pt on Plaquenil for SLE  Cover with augmentin  Disc symptomatic care - see instructions on AVS  Continue flonase  Fluids/rest  Update if not starting to improve in a week or if worsening        Relevant Medications   fluticasone (FLONASE) 50 MCG/ACT nasal spray   amoxicillin-clavulanate (AUGMENTIN) 875-125 MG tablet     Nervous and Auditory   Cerumen impaction    L ear  Recommended use of debrox weekly to monthly  F/u for irrigation if needed Avoid Q tip use

## 2018-06-30 ENCOUNTER — Ambulatory Visit: Payer: BC Managed Care – PPO

## 2018-08-04 ENCOUNTER — Ambulatory Visit
Admission: RE | Admit: 2018-08-04 | Discharge: 2018-08-04 | Disposition: A | Discharge status: Home / Self Care | Payer: BC Managed Care – PPO | Source: Ambulatory Visit | Attending: Obstetrics and Gynecology | Admitting: Obstetrics and Gynecology

## 2018-08-04 DIAGNOSIS — Z1231 Encounter for screening mammogram for malignant neoplasm of breast: Secondary | ICD-10-CM

## 2018-08-19 ENCOUNTER — Other Ambulatory Visit (INDEPENDENT_AMBULATORY_CARE_PROVIDER_SITE_OTHER): Payer: BC Managed Care – PPO

## 2018-08-19 DIAGNOSIS — I10 Essential (primary) hypertension: Secondary | ICD-10-CM

## 2018-08-19 DIAGNOSIS — R739 Hyperglycemia, unspecified: Secondary | ICD-10-CM | POA: Diagnosis not present

## 2018-08-19 LAB — BASIC METABOLIC PANEL
BUN: 22 mg/dL (ref 6–23)
CHLORIDE: 104 meq/L (ref 96–112)
CO2: 26 mEq/L (ref 19–32)
CREATININE: 0.98 mg/dL (ref 0.40–1.20)
Calcium: 9.2 mg/dL (ref 8.4–10.5)
GFR: 74.35 mL/min (ref >60.00)
GLUCOSE: 133 mg/dL — AB (ref 70–99)
POTASSIUM: 3.3 meq/L — AB (ref 3.5–5.1)
Sodium: 138 mEq/L (ref 135–145)

## 2018-08-19 LAB — HEMOGLOBIN A1C: Hgb A1c MFr Bld: 6.3 % (ref 4.6–6.5)

## 2018-08-19 LAB — LIPID PANEL
CHOLESTEROL: 135 mg/dL (ref 0–200)
HDL: 34.8 mg/dL — AB (ref >39.00)
LDL Cholesterol: 83 mg/dL (ref 0–99)
NonHDL: 99.86
Total CHOL/HDL Ratio: 4
Triglycerides: 84 mg/dL (ref 0.0–149.0)
VLDL: 16.8 mg/dL (ref 0.0–40.0)

## 2018-08-26 ENCOUNTER — Encounter: Payer: Self-pay | Admitting: Family Medicine

## 2018-08-26 ENCOUNTER — Ambulatory Visit (INDEPENDENT_AMBULATORY_CARE_PROVIDER_SITE_OTHER): Payer: BC Managed Care – PPO | Admitting: Family Medicine

## 2018-08-26 VITALS — BP 128/72 | HR 68 | Temp 98.2°F | Ht 65.0 in | Wt 219.0 lb

## 2018-08-26 DIAGNOSIS — I10 Essential (primary) hypertension: Secondary | ICD-10-CM | POA: Diagnosis not present

## 2018-08-26 DIAGNOSIS — E876 Hypokalemia: Secondary | ICD-10-CM | POA: Insufficient documentation

## 2018-08-26 DIAGNOSIS — Z0000 Encounter for general adult medical examination without abnormal findings: Secondary | ICD-10-CM | POA: Insufficient documentation

## 2018-08-26 DIAGNOSIS — D696 Thrombocytopenia, unspecified: Secondary | ICD-10-CM

## 2018-08-26 DIAGNOSIS — Z8669 Personal history of other diseases of the nervous system and sense organs: Secondary | ICD-10-CM | POA: Insufficient documentation

## 2018-08-26 DIAGNOSIS — Z6835 Body mass index (BMI) 35.0-35.9, adult: Secondary | ICD-10-CM

## 2018-08-26 DIAGNOSIS — R7303 Prediabetes: Secondary | ICD-10-CM

## 2018-08-26 MED ORDER — AMLODIPINE BESYLATE 5 MG PO TABS: 5.0000 mg | ORAL_TABLET | Freq: Every day | ORAL | 3 refills | Status: DC

## 2018-08-26 MED ORDER — LOSARTAN POTASSIUM-HCTZ 50-12.5 MG PO TABS: 1.0000 | ORAL_TABLET | Freq: Every day | ORAL | 3 refills | Status: DC

## 2018-08-26 MED ORDER — POTASSIUM CHLORIDE ER 10 MEQ PO TBCR: 20.0000 meq | EXTENDED_RELEASE_TABLET | Freq: Every day | ORAL | 3 refills | Status: DC

## 2018-08-26 NOTE — Assessment & Plan Note (Signed)
Stable Will re check with next labs

## 2018-08-26 NOTE — Patient Instructions (Addendum)
Your colonoscopy is due 3/20- call us in march for a referral   Please ask your gyn to send [place] a report after you go (pap test)   If you are interested in the new shingles vaccine (Shingrix) - call your local pharmacy to check on coverage and availability  If affordable, get on a wait list at your pharmacy to get the vaccine. Talk to your rheumatologist about it   To prevent diabetes Try to get most of your carbohydrates from produce (with the exception of white potatoes)  Eat less bread/pasta/rice/snack foods/cereals/sweets and other items from the middle of the grocery store (processed carbs)   Work up to exercise 30 minutes per day or more (5 or more days per week)   Increase potassium to 2 pills per day  We will check labs in about a month

## 2018-08-26 NOTE — Progress Notes (Signed)
Subjective:    Patient ID: Jennifer Rose, female    DOB: 07-22-1956, 62 y.o.   MRN: [id number]  HPI Here for health maintenance exam and to review chronic medical problems    Her daughter bought a house - [place]  Other daughter is still job hunting -stressful    Wt Readings from Last 3 Encounters:  08/26/18 219 lb (99.3 kg)  06/05/18 215 lb 4 oz (97.6 kg)  04/04/18 216 lb (98 kg)  wt is up 4 lb  36.44 kg/m   Tetanus shot -thinks she got one from rheumatology   Flu shot -declines   Colonoscopy 3/10 with 10 y recall   Pap 10/17-gyn -nl  Has a visit upcoming in dec   Mammogram 10/19 neg  Self breast exam -no lumps or changes   Zoster status -never had shingles or vaccine   bp is stable today  No cp or palpitations or headaches or edema  No side effects to medicines  BP Readings from Last 3 Encounters:  08/26/18 128/72  06/05/18 116/78  04/04/18 130/76      Lab Results  Component Value Date   CREATININE 0.98 08/19/2018   BUN 22 08/19/2018   NA 138 08/19/2018   K 3.3 (L) 08/19/2018   CL 104 08/19/2018   CO2 26 08/19/2018   Lab Results  Component Value Date   ALT 24 04/30/2017   AST 23 04/30/2017   ALKPHOS 86 04/30/2017   BILITOT 0.4 04/30/2017   Takes Kdur 10 meq  Will inc  Prediabetes Lab Results  Component Value Date   HGBA1C 6.3 08/19/2018  up from 6.0  Diet -no sweets , no sweet drinks (drinks a lot of water)  Little bread occ pasta  More sweet potatoes / less white potato Exercise -not a lot / knows she wants to start (would like to start walking on a track)    Cholesterol Lab Results  Component Value Date   CHOL 135 08/19/2018   CHOL 137 04/30/2017   CHOL 130 12/16/2015   Lab Results  Component Value Date   HDL 34.80 (L) 08/19/2018   HDL 40.20 04/30/2017   HDL 40.80 12/16/2015   Lab Results  Component Value Date   LDLCALC 83 08/19/2018   LDLCALC 80 04/30/2017   LDLCALC 78 12/16/2015   Lab Results  Component Value Date   TRIG 84.0 08/19/2018   TRIG 81.0 04/30/2017   TRIG 57.0 12/16/2015   Lab Results  Component Value Date   CHOLHDL 4 08/19/2018   CHOLHDL 3 04/30/2017   CHOLHDL 3 12/16/2015   No results found for: LDLDIRECT  HDL is down (less exercise)   Blood pressure  BP Readings from Last 3 Encounters:  08/26/18 128/72  06/05/18 116/78  04/04/18 130/76   Pulse Readings from Last 3 Encounters:  08/26/18 68  06/05/18 (!) 59  04/04/18 75   Lab Results  Component Value Date   WBC 4.5 11/15/2017   HGB 13.3 11/15/2017   HCT 40.1 11/15/2017   MCV 80.5 11/15/2017   PLT 122.0 (L) 11/15/2017   H/o thrombocytopenia   Rheumatologist also checks labs  SLE is stable   Patient Active Problem List   Diagnosis Date Noted  . H/O migraine 08/26/2018  . Routine general medical examination at a health care facility 08/26/2018  . Cerumen impaction 06/05/2018  . Thrombocytopenia ([place]) 04/30/2017  . Prediabetes 12/13/2014  . Class 2 severe obesity due to excess calories with serious comorbidity and body mass  index (BMI) of 35.0 to 35.9 in adult [facility]) 12/13/2014  . Swelling, mass, or lump in chest 05/28/2011  . GLAUCOMA 04/15/2009  . LUPUS 11/09/2008  . Allergic rhinitis, cause unspecified 09/24/2007  . Essential hypertension 03/22/2004   Past Medical History:  Diagnosis Date  . Allergy    allergic rhinitis  . Hypertension 03/22/2004  . Lupus ([place])   . Uterine fibroid 11/01   per GYN   Past Surgical History:  Procedure Laterality Date  . BREAST CYST EXCISION Right 36 yrs ago   x2  . [facility]    . ENDOMETRIAL ABLATION  04/03   polyps?DUB   Social History   Tobacco Use  . Smoking status: Never Smoker  . Smokeless tobacco: Never Used  Substance Use Topics  . Alcohol use: No    Alcohol/week: 0.0 standard drinks  . Drug use: No   History reviewed. No pertinent family history. No Known Allergies Current Outpatient Medications on File Prior to Visit  Medication Sig Dispense Refill  .  aspirin 81 MG tablet Take 81 mg by mouth daily.      . cholecalciferol (VITAMIN D) 1000 UNITS tablet Take 2,000 Units by mouth daily.      . fexofenadine (ALLEGRA ALLERGY) 180 MG tablet Take 1 tablet (180 mg total) by mouth daily. 30 tablet 1  . fluticasone (FLONASE) 50 MCG/ACT nasal spray Place 1-2 sprays into both nostrils daily. 16 g 5  . hydroxychloroquine (PLAQUENIL) 200 MG tablet Take 200 mg by mouth 2 (two) times daily.      Marland Kitchen latanoprost (XALATAN) 0.005 % ophthalmic solution Place 1 drop into both eyes as directed.      . meclizine (ANTIVERT) 12.5 MG tablet Take 12.5 mg by mouth 3 (three) times daily as needed.       No current facility-administered medications on file prior to visit.     Review of Systems  Constitutional: Positive for fatigue. Negative for activity change, appetite change, fever and unexpected weight change.  HENT: Negative for congestion, ear pain, rhinorrhea, sinus pressure and sore throat.   Eyes: Negative for pain, redness and visual disturbance.  Respiratory: Negative for cough, shortness of breath and wheezing.   Cardiovascular: Negative for chest pain and palpitations.  Gastrointestinal: Negative for abdominal pain, blood in stool, constipation and diarrhea.  Endocrine: Negative for polydipsia and polyuria.  Genitourinary: Negative for dysuria, frequency and urgency.  Musculoskeletal: Positive for arthralgias. Negative for back pain and myalgias.  Skin: Negative for pallor and rash.  Allergic/Immunologic: Negative for environmental allergies.  Neurological: Negative for dizziness, syncope and headaches.  Hematological: Negative for adenopathy. Does not bruise/bleed easily.  Psychiatric/Behavioral: Negative for decreased concentration and dysphoric mood. The patient is not nervous/anxious.        Objective:   Physical Exam  Constitutional: She appears well-developed and well-nourished. No distress.  obese and well appearing   HENT:  Head: Normocephalic  and atraumatic.  Right Ear: External ear normal.  Left Ear: External ear normal.  Mouth/Throat: Oropharynx is clear and moist.  Eyes: Pupils are equal, round, and reactive to light. Conjunctivae and EOM are normal. No scleral icterus.  Neck: Normal range of motion. Neck supple. No JVD present. Carotid bruit is not present. No thyromegaly present.  Cardiovascular: Normal rate, regular rhythm, normal heart sounds and intact distal pulses. Exam reveals no gallop.  Pulmonary/Chest: Effort normal and breath sounds normal. No respiratory distress. She has no wheezes. She has no rales. She exhibits no tenderness. No breast tenderness,  discharge or bleeding.  Abdominal: Soft. Bowel sounds are normal. She exhibits no distension, no abdominal bruit and no mass. There is no tenderness.  Genitourinary: No breast tenderness, discharge or bleeding.  Genitourinary Comments: Def gyn/breast exam to her gyn provider- will have in Dec  Musculoskeletal: Normal range of motion. She exhibits no edema or tenderness.  Lymphadenopathy:    She has no cervical adenopathy.  Neurological: She is alert. She has normal reflexes. She displays normal reflexes. No cranial nerve deficit. She exhibits normal muscle tone. Coordination normal.  Skin: Skin is warm and dry. No rash noted. No erythema. No pallor.  Few lentigines and skin tags  Psychiatric: She has a normal mood and affect.  Pleasant           Assessment & Plan:   Problem List Items Addressed This Visit      Cardiovascular and Mediastinum   Essential hypertension    bp in fair control at this time  BP Readings from Last 1 Encounters:  08/26/18 128/72   No changes needed Most recent labs reviewed  Disc lifstyle change with low sodium diet and exercise  K is low-will inc dose to 20 meq      Relevant Medications   amLODipine (NORVASC) 5 MG tablet   losartan-hydrochlorothiazide (HYZAAR) 50-12.5 MG tablet     Other   Class 2 severe obesity due to  excess calories with serious comorbidity and body mass index (BMI) of 35.0 to 35.9 in adult [facility])    Discussed how this problem influences overall health and the risks it imposes  Reviewed plan for weight loss with lower calorie diet (via better food choices and also portion control or program like weight watchers) and exercise building up to or more than 30 minutes 5 days per week including some aerobic activity         Hypokalemia    From hctz  K of 3.3  Inc kcl to 20 meq daily  Re check 2 wk No symptoms       Prediabetes    Lab Results  Component Value Date   HGBA1C 6.3 08/19/2018   disc imp of low glycemic diet and wt loss to prevent DM2       Routine general medical examination at a health care facility - Primary    Reviewed health habits including diet and exercise and skin cancer prevention Reviewed appropriate screening tests for age  Also reviewed health mt list, fam hx and immunization status , as well as social and family history   See HPI Labs rev  Will call rheum office to check on date of tetanus shot  Sent for last pap report Due for colon[place] in 3/[place]  Disc shingrix vaccine       Thrombocytopenia ([place])    Stable Will re check with next labs

## 2018-08-26 NOTE — Assessment & Plan Note (Signed)
Discussed how this problem influences overall health and the risks it imposes  Reviewed plan for weight loss with lower calorie diet (via better food choices and also portion control or program like weight watchers) and exercise building up to or more than 30 minutes 5 days per week including some aerobic activity    

## 2018-08-26 NOTE — Assessment & Plan Note (Signed)
Reviewed health habits including diet and exercise and skin cancer prevention Reviewed appropriate screening tests for age  Also reviewed health mt list, fam hx and immunization status , as well as social and family history   See HPI Labs rev  Will call rheum office to check on date of tetanus shot  Sent for last pap report Due for colonoscopy in [place]  Disc shingrix vaccine

## 2018-08-26 NOTE — Assessment & Plan Note (Signed)
Lab Results  Component Value Date   HGBA1C 6.3 08/19/2018   disc imp of low glycemic diet and wt loss to prevent DM2

## 2018-08-26 NOTE — Assessment & Plan Note (Signed)
From hctz  K of 3.3  Inc kcl to 20 meq daily  Re check 2 wk No symptoms

## 2018-08-26 NOTE — Assessment & Plan Note (Signed)
bp in fair control at this time  BP Readings from Last 1 Encounters:  08/26/18 128/72   No changes needed Most recent labs reviewed  Disc lifstyle change with low sodium diet and exercise  K is low-will inc dose to 20 meq

## 2018-09-02 ENCOUNTER — Ambulatory Visit: Payer: BC Managed Care – PPO | Admitting: Family Medicine

## 2018-09-02 ENCOUNTER — Encounter: Payer: Self-pay | Admitting: Family Medicine

## 2018-09-02 VITALS — BP 112/66 | HR 86 | Temp 99.0°F | Ht 65.0 in | Wt 215.5 lb

## 2018-09-02 DIAGNOSIS — R509 Fever, unspecified: Secondary | ICD-10-CM | POA: Diagnosis not present

## 2018-09-02 DIAGNOSIS — J101 Influenza due to other identified influenza virus with other respiratory manifestations: Secondary | ICD-10-CM | POA: Diagnosis not present

## 2018-09-02 LAB — POC INFLUENZA A&B (BINAX/QUICKVUE)
INFLUENZA B, POC: NEGATIVE
Influenza A, POC: POSITIVE — AB

## 2018-09-02 NOTE — Progress Notes (Signed)
Subjective:    Patient ID: Jennifer Rose, female    DOB: 09-29-1956, 62 y.o.   MRN: [id number]  HPI Here for sinus symptoms   Thursday pm- felt achy and cold  Friday night felt terrible - aching and tired  Went to work Saturday- felt like lupus was flaring  Stayed in bed all day Sunday (temp 100)  pnd  Congestion  Back ache- middle  Coughing - can feel congestion but not much coming out - is yellow Ears are ringing  Head hurts a bit  Sore throat -last few nights  Sinus pain under eyes   Resting  Fluids  No otc meds except advil   Rapid flu test pos for A type today  Results for orders placed or performed in visit on 09/02/18  POC Influenza A&B(BINAX/QUICKVUE)  Result Value Ref Range   Influenza A, POC Positive (A) Negative   Influenza B, POC Negative Negative     Temp: 99 F (37.2 C)   Patient Active Problem List   Diagnosis Date Noted  . Upper respiratory infection 09/02/2018  . H/O migraine 08/26/2018  . Routine general medical examination at a health care facility 08/26/2018  . Hypokalemia 08/26/2018  . Thrombocytopenia ([place]) 04/30/2017  . Prediabetes 12/13/2014  . Class 2 severe obesity due to excess calories with serious comorbidity and body mass index (BMI) of 35.0 to 35.9 in adult [facility]) 12/13/2014  . GLAUCOMA 04/15/2009  . LUPUS 11/09/2008  . Allergic rhinitis, cause unspecified 09/24/2007  . Essential hypertension 03/22/2004   Past Medical History:  Diagnosis Date  . Allergy    allergic rhinitis  . Hypertension 03/22/2004  . Lupus ([place])   . Uterine fibroid 11/01   per GYN   Past Surgical History:  Procedure Laterality Date  . BREAST CYST EXCISION Right 36 yrs ago   x2  . [facility]    . ENDOMETRIAL ABLATION  04/03   polyps?DUB   Social History   Tobacco Use  . Smoking status: Never Smoker  . Smokeless tobacco: Never Used  Substance Use Topics  . Alcohol use: No    Alcohol/week: 0.0 standard drinks  . Drug use: No   History reviewed. No  pertinent family history. No Known Allergies Current Outpatient Medications on File Prior to Visit  Medication Sig Dispense Refill  . amLODipine (NORVASC) 5 MG tablet Take 1 tablet (5 mg total) by mouth daily. 90 tablet 3  . aspirin 81 MG tablet Take 81 mg by mouth daily.      . cholecalciferol (VITAMIN D) 1000 UNITS tablet Take 2,000 Units by mouth daily.      . fexofenadine (ALLEGRA ALLERGY) 180 MG tablet Take 1 tablet (180 mg total) by mouth daily. 30 tablet 1  . fluticasone (FLONASE) 50 MCG/ACT nasal spray Place 1-2 sprays into both nostrils daily. 16 g 5  . hydroxychloroquine (PLAQUENIL) 200 MG tablet Take 200 mg by mouth 2 (two) times daily.      Marland Kitchen latanoprost (XALATAN) 0.005 % ophthalmic solution Place 1 drop into both eyes as directed.      Marland Kitchen losartan-hydrochlorothiazide (HYZAAR) 50-12.5 MG tablet Take 1 tablet by mouth daily. 90 tablet 3  . meclizine (ANTIVERT) 12.5 MG tablet Take 12.5 mg by mouth 3 (three) times daily as needed.      . potassium chloride (K-DUR) 10 MEQ tablet Take 2 tablets (20 mEq total) by mouth daily. 180 tablet 3   No current facility-administered medications on file prior to visit.  Review of Systems  Constitutional: Positive for appetite change, chills, fatigue and fever.  HENT: Positive for congestion, postnasal drip, rhinorrhea, sinus pressure, sinus pain, sneezing and sore throat. Negative for ear pain and voice change.   Eyes: Negative for pain and discharge.  Respiratory: Positive for cough. Negative for chest tightness, shortness of breath, wheezing and stridor.   Cardiovascular: Negative for chest pain.  Gastrointestinal: Negative for diarrhea, nausea and vomiting.  Genitourinary: Negative for frequency, hematuria and urgency.  Musculoskeletal: Negative for arthralgias and myalgias.  Skin: Negative for rash.  Neurological: Positive for headaches. Negative for dizziness, weakness and light-headedness.  Psychiatric/Behavioral: Negative for  confusion and dysphoric mood.       Objective:   Physical Exam  Constitutional: She appears well-developed and well-nourished. No distress.  overwt and fatigued appearing  HENT:  Head: Normocephalic and atraumatic.  Right Ear: External ear normal.  Left Ear: External ear normal.  Mouth/Throat: Oropharynx is clear and moist.  Nares are injected and congested  bilat maxillary sinus tenderness Clear rhinorrhea and post nasal drip   Eyes: Pupils are equal, round, and reactive to light. Conjunctivae and EOM are normal. Right eye exhibits no discharge. Left eye exhibits no discharge. No scleral icterus.  Neck: Normal range of motion. Neck supple.  Cardiovascular: Normal rate and normal heart sounds.  Pulmonary/Chest: Effort normal and breath sounds normal. No stridor. No respiratory distress. She has no wheezes. She has no rales. She exhibits no tenderness.  Good air exch No rales/rhonchi or wheeze    Lymphadenopathy:    She has no cervical adenopathy.  Neurological: She is alert.  Skin: Skin is warm and dry. No rash noted. No erythema.  Psychiatric: She has a normal mood and affect.          Assessment & Plan:   Problem List Items Addressed This Visit      Respiratory   Influenza A    With pos rapid flu test Symptoms since Thursday-out of the window for tamiflu  On plaquenil  Does not get flu shots-urged her to do so when better Disc symptomatic care - see instructions on AVS  Tessalon mucinex dm  Analgesics/anti pyretics  Rest/fluids/quarantine until better  Update if not starting to improve in a week or if worsening         Other Visit Diagnoses    Fever, unspecified fever cause    -  Primary   Relevant Orders   POC Influenza A&B(BINAX/QUICKVUE) (Completed)

## 2018-09-02 NOTE — Assessment & Plan Note (Signed)
With pos rapid flu test Symptoms since Thursday-out of the window for tamiflu  On plaquenil  Does not get flu shots-urged her to do so when better Disc symptomatic care - see instructions on AVS  Tessalon mucinex dm  Analgesics/anti pyretics  Rest/fluids/quarantine until better  Update if not starting to improve in a week or if worsening

## 2018-09-02 NOTE — Patient Instructions (Addendum)
You have influenza A  I think you have a mild case Continue treating fever - ibuprofen and /or tylenol (always take ibuprofen with food)   Drink lots of fluids Rest   For congestion and cough - mucinex DM Tessalon (you have left) in addition to that  Nasal saline spray ad needed   You need to avoid people until fever is down and you feel better

## 2018-09-25 ENCOUNTER — Other Ambulatory Visit (INDEPENDENT_AMBULATORY_CARE_PROVIDER_SITE_OTHER): Payer: BC Managed Care – PPO

## 2018-09-25 DIAGNOSIS — E876 Hypokalemia: Secondary | ICD-10-CM

## 2018-09-25 DIAGNOSIS — D696 Thrombocytopenia, unspecified: Secondary | ICD-10-CM | POA: Diagnosis not present

## 2018-09-25 LAB — CBC WITH DIFFERENTIAL/PLATELET
BASOS ABS: 0 10*3/uL (ref 0.0–0.1)
BASOS PCT: 0.5 % (ref 0.0–3.0)
EOS ABS: 0.1 10*3/uL (ref 0.0–0.7)
EOS PCT: 1.6 % (ref 0.0–5.0)
HCT: 36.9 % (ref 36.0–46.0)
HEMOGLOBIN: 12.3 g/dL (ref 12.0–15.0)
LYMPHS ABS: 1.1 10*3/uL (ref 0.7–4.0)
Lymphocytes Relative: 30.2 % (ref 12.0–46.0)
MCHC: 33.2 g/dL (ref 30.0–36.0)
MCV: 79 fl (ref 78.0–100.0)
MONO ABS: 0.4 10*3/uL (ref 0.1–1.0)
Monocytes Relative: 12.5 % — ABNORMAL HIGH (ref 3.0–12.0)
NEUTROS PCT: 55.2 % (ref 43.0–77.0)
Neutro Abs: 2 10*3/uL (ref 1.4–7.7)
Platelets: 102 10*3/uL — ABNORMAL LOW (ref 150.0–400.0)
RBC: 4.68 Mil/uL (ref 3.87–5.11)
RDW: 14.1 % (ref 11.5–15.5)
WBC: 3.6 10*3/uL — ABNORMAL LOW (ref 4.0–10.5)

## 2018-09-25 LAB — BASIC METABOLIC PANEL
BUN: 20 mg/dL (ref 6–23)
CO2: 26 meq/L (ref 19–32)
Calcium: 9.2 mg/dL (ref 8.4–10.5)
Chloride: 107 mEq/L (ref 96–112)
Creatinine, Ser: 1.08 mg/dL (ref 0.40–1.20)
GFR: 66.44 mL/min (ref >60.00)
GLUCOSE: 126 mg/dL — AB (ref 70–99)
POTASSIUM: 3.8 meq/L (ref 3.5–5.1)
SODIUM: 140 meq/L (ref 135–145)

## 2018-09-26 ENCOUNTER — Telehealth: Payer: Self-pay | Admitting: *Deleted

## 2018-09-26 NOTE — Telephone Encounter (Signed)
Left VM requesting pt to call the office back regarding lab results  

## 2018-09-29 NOTE — Telephone Encounter (Signed)
Pt returned call to [place]. °

## 2018-09-29 NOTE — Telephone Encounter (Signed)
She is pre diabetic with  Lab Results  Component Value Date   HGBA1C 6.3 08/19/2018   We cannot draw another A1C until February  Her weight is likely the biggest reason -so just keep working on healthy diet and exercise the best she can  Hopefully this will be down the next time we check it (schedule lab in feb for A1C for prediabetes)  If diabetes runs in her family -that is more of a challenge  Try to get most of your carbohydrates from produce (with the exception of white potatoes)  Eat less bread/pasta/rice/snack foods/cereals/sweets and other items from the middle of the grocery store (processed carbs) Try to exercise 20-30 minutes per day

## 2018-09-29 NOTE — Telephone Encounter (Signed)
See lab results. Per Dr. Glori Bickers pt is to schedule a lab appt in one month to recheck everything. I called and scheduled lab appt with pt but she wanted to let Dr. Glori Bickers know that she is concerned with her blood sugar being elevated. Pt said she has been really working on her diet and cut out sweets and soda and juice and she is eating healthy so she's concerned that her BS is still high and wants to ask Dr. Glori Bickers if something could be wrong with her pancreas? Pt is very concerned

## 2018-09-29 NOTE — Telephone Encounter (Signed)
Pt notified of all of Dr. Marliss Coots comments and instructions and verbalized understanding. Lab apt scheduled

## 2018-10-02 LAB — HM PAP SMEAR: HM PAP: NEGATIVE

## 2018-10-07 ENCOUNTER — Encounter: Payer: Self-pay | Admitting: Family Medicine

## 2018-10-20 ENCOUNTER — Encounter: Payer: Self-pay | Admitting: Family Medicine

## 2018-10-26 ENCOUNTER — Telehealth: Payer: Self-pay | Admitting: Family Medicine

## 2018-10-26 DIAGNOSIS — D696 Thrombocytopenia, unspecified: Secondary | ICD-10-CM

## 2018-10-26 NOTE — Telephone Encounter (Signed)
-----   Message from Ellamae Sia sent at 10/20/2018  9:53 AM EST ----- Regarding: Lab orders for Monday, 1.6.20 Lab orders for f/u labs

## 2018-10-27 ENCOUNTER — Other Ambulatory Visit: Payer: BC Managed Care – PPO

## 2018-11-03 ENCOUNTER — Other Ambulatory Visit (INDEPENDENT_AMBULATORY_CARE_PROVIDER_SITE_OTHER): Payer: BC Managed Care – PPO

## 2018-11-03 DIAGNOSIS — D696 Thrombocytopenia, unspecified: Secondary | ICD-10-CM

## 2018-11-03 LAB — CBC WITH DIFFERENTIAL/PLATELET
Basophils Absolute: 0 10*3/uL (ref 0.0–0.1)
Basophils Relative: 0.5 % (ref 0.0–3.0)
Eosinophils Absolute: 0 10*3/uL (ref 0.0–0.7)
Eosinophils Relative: 0.3 % (ref 0.0–5.0)
HCT: 39.2 % (ref 36.0–46.0)
HEMOGLOBIN: 13.1 g/dL (ref 12.0–15.0)
Lymphocytes Relative: 22.6 % (ref 12.0–46.0)
Lymphs Abs: 1.2 10*3/uL (ref 0.7–4.0)
MCHC: 33.5 g/dL (ref 30.0–36.0)
MCV: 79.7 fl (ref 78.0–100.0)
Monocytes Absolute: 0.4 10*3/uL (ref 0.1–1.0)
Monocytes Relative: 7.6 % (ref 3.0–12.0)
Neutro Abs: 3.7 10*3/uL (ref 1.4–7.7)
Neutrophils Relative %: 69 % (ref 43.0–77.0)
Platelets: 132 10*3/uL — ABNORMAL LOW (ref 150.0–400.0)
RBC: 4.91 Mil/uL (ref 3.87–5.11)
RDW: 14.8 % (ref 11.5–15.5)
WBC: 5.4 10*3/uL (ref 4.0–10.5)

## 2018-11-04 LAB — PATHOLOGIST SMEAR REVIEW

## 2018-11-23 ENCOUNTER — Telehealth: Payer: Self-pay | Admitting: Family Medicine

## 2018-11-23 DIAGNOSIS — R7303 Prediabetes: Secondary | ICD-10-CM

## 2018-11-23 DIAGNOSIS — I10 Essential (primary) hypertension: Secondary | ICD-10-CM

## 2018-11-23 DIAGNOSIS — D696 Thrombocytopenia, unspecified: Secondary | ICD-10-CM

## 2018-11-23 NOTE — Telephone Encounter (Signed)
-----   Message from Lendon Collar, RT sent at 11/18/2018 12:41 PM EST ----- Regarding: Lab orders for Wed 11/26/18 Please enter lab orders for 11/26/18. Schedule states that patient is coming in an a1c. Thanks!

## 2018-11-26 ENCOUNTER — Other Ambulatory Visit: Payer: BC Managed Care – PPO

## 2018-12-04 ENCOUNTER — Other Ambulatory Visit (INDEPENDENT_AMBULATORY_CARE_PROVIDER_SITE_OTHER): Payer: BC Managed Care – PPO

## 2018-12-04 DIAGNOSIS — D696 Thrombocytopenia, unspecified: Secondary | ICD-10-CM

## 2018-12-04 DIAGNOSIS — R7303 Prediabetes: Secondary | ICD-10-CM | POA: Diagnosis not present

## 2018-12-04 DIAGNOSIS — I10 Essential (primary) hypertension: Secondary | ICD-10-CM | POA: Diagnosis not present

## 2018-12-04 LAB — COMPREHENSIVE METABOLIC PANEL
ALK PHOS: 86 U/L (ref 39–117)
ALT: 21 U/L (ref 0–35)
AST: 27 U/L (ref 0–37)
Albumin: 3.8 g/dL (ref 3.5–5.2)
BUN: 14 mg/dL (ref 6–23)
CALCIUM: 9 mg/dL (ref 8.4–10.5)
CO2: 27 mEq/L (ref 19–32)
Chloride: 104 mEq/L (ref 96–112)
Creatinine, Ser: 1.05 mg/dL (ref 0.40–1.20)
GFR: 64.53 mL/min (ref >60.00)
Glucose, Bld: 124 mg/dL — ABNORMAL HIGH (ref 70–99)
Potassium: 3.5 mEq/L (ref 3.5–5.1)
Sodium: 139 mEq/L (ref 135–145)
Total Bilirubin: 0.4 mg/dL (ref 0.2–1.2)
Total Protein: 7.2 g/dL (ref 6.0–8.3)

## 2018-12-04 LAB — CBC WITH DIFFERENTIAL/PLATELET
Basophils Absolute: 0 10*3/uL (ref 0.0–0.1)
Basophils Relative: 0.6 % (ref 0.0–3.0)
EOS PCT: 1.5 % (ref 0.0–5.0)
Eosinophils Absolute: 0.1 10*3/uL (ref 0.0–0.7)
HCT: 37.4 % (ref 36.0–46.0)
Hemoglobin: 12.4 g/dL (ref 12.0–15.0)
Lymphocytes Relative: 25.1 % (ref 12.0–46.0)
Lymphs Abs: 1.2 10*3/uL (ref 0.7–4.0)
MCHC: 33.1 g/dL (ref 30.0–36.0)
MCV: 80.1 fl (ref 78.0–100.0)
Monocytes Absolute: 0.4 10*3/uL (ref 0.1–1.0)
Monocytes Relative: 8.1 % (ref 3.0–12.0)
Neutro Abs: 3 10*3/uL (ref 1.4–7.7)
Neutrophils Relative %: 64.7 % (ref 43.0–77.0)
Platelets: 100 10*3/uL — ABNORMAL LOW (ref 150.0–400.0)
RBC: 4.67 Mil/uL (ref 3.87–5.11)
RDW: 14.2 % (ref 11.5–15.5)
WBC: 4.6 10*3/uL (ref 4.0–10.5)

## 2018-12-04 LAB — HEMOGLOBIN A1C: Hgb A1c MFr Bld: 6.4 % (ref 4.6–6.5)

## 2019-01-07 ENCOUNTER — Telehealth: Payer: Self-pay

## 2019-01-07 NOTE — Telephone Encounter (Signed)
Pt left v/m requesting cb; has a question about lab results.

## 2019-01-08 NOTE — Telephone Encounter (Signed)
Pt was referred to a hematologist by her rheumatologist. When she received the appt info she realized it said hematologist/ oncologist, pt was worried about having cancer and no one told her. I advised pt that the hematologist are just a part of the oncologist dpt and that pt are seen there for blood disorders/issues and you don't have to have cancer to see them you just have to have an issue with you blood work. Pt was okay after that

## 2019-01-29 ENCOUNTER — Telehealth: Payer: Self-pay | Admitting: Hematology

## 2019-01-29 NOTE — Telephone Encounter (Signed)
A new hem appt has been scheduled for the pt to see Dr. Irene Limbo on 5/7 at 10am. Pt has been cld and made aware to arrive 15 minutes early. Letter mailed.

## 2019-02-13 ENCOUNTER — Encounter: Payer: Self-pay | Admitting: Hematology

## 2019-02-16 ENCOUNTER — Other Ambulatory Visit: Payer: Self-pay

## 2019-02-16 ENCOUNTER — Encounter: Payer: Self-pay | Admitting: Family Medicine

## 2019-02-16 ENCOUNTER — Ambulatory Visit (INDEPENDENT_AMBULATORY_CARE_PROVIDER_SITE_OTHER): Payer: BC Managed Care – PPO | Admitting: Family Medicine

## 2019-02-16 DIAGNOSIS — J0110 Acute frontal sinusitis, unspecified: Secondary | ICD-10-CM

## 2019-02-16 DIAGNOSIS — Z6835 Body mass index (BMI) 35.0-35.9, adult: Secondary | ICD-10-CM

## 2019-02-16 DIAGNOSIS — D696 Thrombocytopenia, unspecified: Secondary | ICD-10-CM

## 2019-02-16 DIAGNOSIS — M3219 Other organ or system involvement in systemic lupus erythematosus: Secondary | ICD-10-CM

## 2019-02-16 DIAGNOSIS — J019 Acute sinusitis, unspecified: Secondary | ICD-10-CM | POA: Insufficient documentation

## 2019-02-16 MED ORDER — AMOXICILLIN-POT CLAVULANATE 875-125 MG PO TABS: 1.0000 | ORAL_TABLET | Freq: Two times a day (BID) | ORAL | 0 refills | Status: DC

## 2019-02-16 NOTE — Assessment & Plan Note (Signed)
Continues plaquenil Stable Seeing Dr Amil Amen

## 2019-02-16 NOTE — Assessment & Plan Note (Signed)
Our last platelet ct 100  Also seen with rheumatology  Has new pt appt with hematology 5/12  No bruising or bleeding  Will continue to watch  ? If related to her SLE

## 2019-02-16 NOTE — Assessment & Plan Note (Signed)
While at home during pandemic-she goes for regular walks on her property and tries to eat healthy  Enc her to continue this

## 2019-02-16 NOTE — Assessment & Plan Note (Signed)
In pt with chronic allergy congestion -now with ear and sinus pain (worse on the R) for close to a week Fluids/rest/analgesics prn for symptoms Continue flonase and antihistamine  tx with augmentin bid for 7d  Update if not starting to improve in a week or if worsening   Disc s/s of Covid -19 as well She is immunocomp but staying in and no contact with the public fortunately

## 2019-02-16 NOTE — Progress Notes (Signed)
Virtual Visit via Video Note  I connected with Jennifer Rose on 02/16/19 at  9:30 AM EDT by a video enabled telemedicine application and verified that I am speaking with the correct person using two identifiers. The patient is at home today  I am in my office at [name]    I discussed the limitations of evaluation and management by telemedicine and the availability of in person appointments. The patient expressed understanding and agreed to proceed.  History of Present Illness: Pt presents with a sinus headache for about a week   Both ears are full/ aches when she blows her nose Discomfort in her face- around eyes  R side is worse  Post nasal drip  Hurts in ears to blow her nose  Nasal d/c has some color to it  Some sore throat   Her face is tender  Coughs at night  Sometimes productive-in am / also color to d/c    No sick contacts   Allergy medicines : otc antihistamine  flonase- using that daily   She is staying in out of the pollen  No fever  Feels ok   Has SLE Taking plaquenil -Dr Amil Amen   Last cbc Lab Results  Component Value Date   WBC 4.6 12/04/2018   HGB 12.4 12/04/2018   HCT 37.4 12/04/2018   MCV 80.1 12/04/2018   PLT 100.0 (L) 12/04/2018   H/o low platelets Has appt with hematology on 5/12  No bleeding or bruising  Rheumatology is also watching platelet   Review of Systems  Constitutional: Negative for chills, diaphoresis, fever, malaise/fatigue and weight loss.  HENT: Positive for congestion, ear pain, sinus pain and sore throat. Negative for ear discharge, hearing loss, nosebleeds and tinnitus.   Eyes: Negative for blurred vision, discharge and redness.  Respiratory: Positive for cough and sputum production. Negative for wheezing.   Cardiovascular: Negative for chest pain and palpitations.  Gastrointestinal: Negative for abdominal pain, diarrhea, nausea and vomiting.  Musculoskeletal: Negative for myalgias.  Skin: Negative for rash.   Neurological: Positive for headaches. Negative for dizziness.  Psychiatric/Behavioral: Negative for depression.      Patient Active Problem List   Diagnosis Date Noted  . Acute sinusitis 02/16/2019  . Influenza A 09/02/2018  . H/O migraine 08/26/2018  . Routine general medical examination at a health care facility 08/26/2018  . Hypokalemia 08/26/2018  . Thrombocytopenia ([place]) 04/30/2017  . Prediabetes 12/13/2014  . Class 2 severe obesity due to excess calories with serious comorbidity and body mass index (BMI) of 35.0 to 35.9 in adult [facility]) 12/13/2014  . GLAUCOMA 04/15/2009  . LUPUS 11/09/2008  . Allergic rhinitis, cause unspecified 09/24/2007  . Essential hypertension 03/22/2004   Past Medical History:  Diagnosis Date  . Allergy    allergic rhinitis  . Hypertension 03/22/2004  . Lupus ([place])   . Uterine fibroid 11/01   per GYN   Past Surgical History:  Procedure Laterality Date  . BREAST CYST EXCISION Right 36 yrs ago   x2  . [facility]    . ENDOMETRIAL ABLATION  04/03   polyps?DUB   Social History   Tobacco Use  . Smoking status: Never Smoker  . Smokeless tobacco: Never Used  Substance Use Topics  . Alcohol use: No    Alcohol/week: 0.0 standard drinks  . Drug use: No   History reviewed. No pertinent family history. No Known Allergies Current Outpatient Medications on File Prior to Visit  Medication Sig Dispense Refill  .  amLODipine (NORVASC) 5 MG tablet Take 1 tablet (5 mg total) by mouth daily. 90 tablet 3  . aspirin 81 MG tablet Take 81 mg by mouth daily.      . cholecalciferol (VITAMIN D) 1000 UNITS tablet Take 2,000 Units by mouth daily.      . fexofenadine (ALLEGRA ALLERGY) 180 MG tablet Take 1 tablet (180 mg total) by mouth daily. 30 tablet 1  . fluticasone (FLONASE) 50 MCG/ACT nasal spray Place 1-2 sprays into both nostrils daily. 16 g 5  . hydroxychloroquine (PLAQUENIL) 200 MG tablet Take 200 mg by mouth 2 (two) times daily.      Marland Kitchen latanoprost (XALATAN)  0.005 % ophthalmic solution Place 1 drop into both eyes as directed.      Marland Kitchen losartan-hydrochlorothiazide (HYZAAR) 50-12.5 MG tablet Take 1 tablet by mouth daily. 90 tablet 3  . meclizine (ANTIVERT) 12.5 MG tablet Take 12.5 mg by mouth 3 (three) times daily as needed.      . potassium chloride (K-DUR) 10 MEQ tablet Take 2 tablets (20 mEq total) by mouth daily. 180 tablet 3   No current facility-administered medications on file prior to visit.     Observations/Objective: Patient appears well/like her usual self  No facial swelling or asymmetry (she has pain when pressing on frontal and ethmoid sinuses) No conjunctival injection  No sob or cough with speech Voice is not hoarse but she sounds congested in the nose  Mood is good/ talkative    Assessment and Plan: Problem List Items Addressed This Visit      Respiratory   Acute sinusitis - Primary    In pt with chronic allergy congestion -now with ear and sinus pain (worse on the R) for close to a week Fluids/rest/analgesics prn for symptoms Continue flonase and antihistamine  tx with augmentin bid for 7d  Update if not starting to improve in a week or if worsening   Disc s/s of Covid -19 as well She is immunocomp but staying in and no contact with the public fortunately      Relevant Medications   amoxicillin-clavulanate (AUGMENTIN) 875-125 MG tablet     Other   LUPUS    Continues plaquenil Stable Seeing Dr Amil Amen      Class 2 severe obesity due to excess calories with serious comorbidity and body mass index (BMI) of 35.0 to 35.9 in adult [facility])    While at home during pandemic-she goes for regular walks on her property and tries to eat healthy  Enc her to continue this       Thrombocytopenia ([place])    Our last platelet ct 100  Also seen with rheumatology  Has new pt appt with hematology 5/12  No bruising or bleeding  Will continue to watch  ? If related to her SLE          Follow Up Instructions: Continue  antihistamine and flonase and wear mask outside Nasal saline is ok for congestion Fluids/rest  Take augmentin as directed for sinusitis Update if not starting to improve in a week or if worsening     I discussed the assessment and treatment plan with the patient. The patient was provided an opportunity to ask questions and all were answered. The patient agreed with the plan and demonstrated an understanding of the instructions.   The patient was advised to call back or seek an in-person evaluation if the symptoms worsen or if the condition fails to improve as anticipated.     [employer]  Glori Bickers, MD

## 2019-02-25 NOTE — Progress Notes (Signed)
HEMATOLOGY/ONCOLOGY CONSULTATION NOTE  Date of Service: 02/26/2019  Patient Care Team: Tower, Wynelle Fanny, MD as PCP - General Leigh Aurora, MD as Rheumatologist  CHIEF COMPLAINTS/PURPOSE OF CONSULTATION:  Thrombocytopenia   HISTORY OF PRESENTING ILLNESS:   Jennifer Rose is a wonderful 64 y.o. female who has been referred to [place] by Dr. Leigh Aurora at [facility] for evaluation and management of Thrombocytopenia. The pt reports that she is doing well overall.  The pt reports that she had the flu in November 2019, and has known of lower platelet counts most acutely since then. The pt has been taking Augmentin for the last week for a sinus infection with her PCP. She notes that she has SLE. The pt notes that she is prone to migraines, and takes Advil for these. Reports taking Advil not very frequently, and notes migraines have been becoming less frequent. Endorses triggers of red food coloring, cheese, bananas, and something used in doughnuts. She has been staying away from these triggers.   The pt has been taking Plaquenil for 25 years. She is compliant with twice per year ophthalmology visits. She denies lung, heart, or kidney problems. She denies thyroid issues and also denies liver disease.   The pt denies any abnormal bruises and denies concerns for abnormal bruising. She denies nose bleeds or gum bleeds.  Most recent lab results (12/25/18) of CBC w/diff is as follows: all values are WNL except for PLT at 102k.  On review of systems, pt reports recent sinus infection, good energy levels, and denies nose bleeds, gum bleeds, abnormal bruising, fevers, unexpected weight loss, chills, night sweats, mouth sores, noticing any new lumps or bumps, abdominal pains, leg swelling, lower abdominal pains, swollen/painful joints, skin rashes, and any other symptoms.  On PMHx the pt reports Systemic lupus erythematosis, Raynaud's disease, HTN, migraines, thrombocytopenia, denies liver  disease. On Social Hx the pt denies concern for excessive alcohol use. Denies smoking cigarettes. Denies chemical exposure. On Family Hx the pt denies blood disorders.   MEDICAL HISTORY:  Past Medical History:  Diagnosis Date  . Allergy    allergic rhinitis  . Hypertension 03/22/2004  . Lupus ([place])   . Uterine fibroid 11/01   per GYN    SURGICAL HISTORY: Past Surgical History:  Procedure Laterality Date  . BREAST CYST EXCISION Right 36 yrs ago   x2  . [facility]    . ENDOMETRIAL ABLATION  04/03   polyps?DUB    SOCIAL HISTORY: Social History   Socioeconomic History  . Marital status: Divorced    Spouse name: Not on file  . Number of children: Not on file  . Years of education: Not on file  . Highest education level: Not on file  Occupational History  . Not on file  Social Needs  . Financial resource strain: Not on file  . Food insecurity:    Worry: Not on file    Inability: Not on file  . Transportation needs:    Medical: Not on file    Non-medical: Not on file  Tobacco Use  . Smoking status: Never Smoker  . Smokeless tobacco: Never Used  Substance and Sexual Activity  . Alcohol use: No    Alcohol/week: 0.0 standard drinks  . Drug use: No  . Sexual activity: Not on file  Lifestyle  . Physical activity:    Days per week: Not on file    Minutes per session: Not on file  . Stress: Not on file  Relationships  .  Social connections:    Talks on phone: Not on file    Gets together: Not on file    Attends religious service: Not on file    Active member of club or organization: Not on file    Attends meetings of clubs or organizations: Not on file    Relationship status: Not on file  . Intimate partner violence:    Fear of current or ex partner: Not on file    Emotionally abused: Not on file    Physically abused: Not on file    Forced sexual activity: Not on file  Other Topics Concern  . Not on file  Social History Narrative  . Not on file    FAMILY  HISTORY: No family history on file.  ALLERGIES:  has No Known Allergies.  MEDICATIONS:  Current Outpatient Medications  Medication Sig Dispense Refill  . amLODipine (NORVASC) 5 MG tablet Take 1 tablet (5 mg total) by mouth daily. 90 tablet 3  . amoxicillin-clavulanate (AUGMENTIN) 875-125 MG tablet Take 1 tablet by mouth 2 (two) times daily. 14 tablet 0  . aspirin 81 MG tablet Take 81 mg by mouth daily.      . cholecalciferol (VITAMIN D) 1000 UNITS tablet Take 2,000 Units by mouth daily.      . fexofenadine (ALLEGRA ALLERGY) 180 MG tablet Take 1 tablet (180 mg total) by mouth daily. 30 tablet 1  . fluticasone (FLONASE) 50 MCG/ACT nasal spray Place 1-2 sprays into both nostrils daily. 16 g 5  . hydroxychloroquine (PLAQUENIL) 200 MG tablet Take 200 mg by mouth 2 (two) times daily.      Marland Kitchen latanoprost (XALATAN) 0.005 % ophthalmic solution Place 1 drop into both eyes as directed.      Marland Kitchen losartan-hydrochlorothiazide (HYZAAR) 50-12.5 MG tablet Take 1 tablet by mouth daily. 90 tablet 3  . meclizine (ANTIVERT) 12.5 MG tablet Take 12.5 mg by mouth 3 (three) times daily as needed.      . potassium chloride (K-DUR) 10 MEQ tablet Take 2 tablets (20 mEq total) by mouth daily. 180 tablet 3   No current facility-administered medications for this visit.     REVIEW OF SYSTEMS:    10 Point review of Systems was done is negative except as noted above.  PHYSICAL EXAMINATION:  . Vitals:   02/26/19 0948  BP: 124/83  Pulse: 71  Resp: 17  Temp: 98.4 F (36.9 C)  SpO2: 100%   Filed Weights   02/26/19 0948  Weight: 215 lb 12.8 oz (97.9 kg)   .Body mass index is 35.91 kg/m.  GENERAL:alert, in no acute distress and comfortable SKIN: no acute rashes, no significant lesions EYES: conjunctiva are pink and non-injected, sclera anicteric OROPHARYNX: MMM, no exudates, no oropharyngeal erythema or ulceration NECK: supple, no JVD LYMPH:  no palpable lymphadenopathy in the cervical, axillary or inguinal  regions LUNGS: clear to auscultation b/l with normal respiratory effort HEART: regular rate & rhythm ABDOMEN:  normoactive bowel sounds , non tender, not distended. Extremity: no pedal edema PSYCH: alert & oriented x 3 with fluent speech NEURO: no focal motor/sensory deficits  LABORATORY DATA:  I have reviewed the data as listed  . CBC Latest Ref Rng & Units 02/26/2019 12/04/2018 11/03/2018  WBC 4.0 - 10.5 K/uL 3.8(L) 4.6 5.4  Hemoglobin 12.0 - 15.0 g/dL 11.7(L) 12.4 13.1  Hematocrit 36.0 - 46.0 % 35.8(L) 37.4 39.2  Platelets 150 - 400 K/uL 99(L) 100.0(L) 132.0(L)    . CMP Latest Ref Rng & Units  02/26/2019 12/04/2018 09/25/2018  Glucose 70 - 99 mg/dL 102(H) 124(H) 126(H)  BUN 6 - 20 mg/dL 15 14 20   Creatinine 0.44 - 1.00 mg/dL 1.07(H) 1.05 1.08  Sodium 135 - 145 mmol/L 141 139 140  Potassium 3.5 - 5.1 mmol/L 3.1(L) 3.5 3.8  Chloride 98 - 111 mmol/L 107 104 107  CO2 22 - 32 mmol/L 26 27 26   Calcium 8.9 - 10.3 mg/dL 9.2 9.0 9.2  Total Protein 6.5 - 8.1 g/dL 7.9 7.2 -  Total Bilirubin 0.3 - 1.2 mg/dL 0.4 0.4 -  Alkaline Phos 38 - 126 U/L 94 86 -  AST 15 - 41 U/L 36 27 -  ALT 0 - 44 U/L 32 21 -   Component     Latest Ref Rng & Units 02/26/2019  Vitamin B12     180 - 914 pg/mL 274  HCV Ab     0.0 - 0.9 s/co ratio <0.1    12/25/18 CBC w/diff:    RADIOGRAPHIC STUDIES: I have personally reviewed the radiological images as listed and agreed with the findings in the report. No results found.  ASSESSMENT & PLAN:  63 y.o. female with  1. Thrombocytopenia PLAN -Discussed patient's most recent labs from 12/25/18, WBC normal at 4.3k, HGB normal at 12.2, PLT low at 102k. Complement C4 at 13. Sed Rate at 24. Complement C3 at 83. CRP at <1 -Reviewed previous labs: 12/04/18 PLT at 100k. 11/03/18 PLt at 132k. 09/25/18 PLT at 102k. 11/15/17 PLT at 122k. 04/30/17 PLT at 117k. 09/20/14 PLT at 146k. -11/03/18 Pathologist smear review noted "Platelet clumps noted on smear- count appears adequate."  -Discussed that systemic lupus erythematosis can cause platelet clumping outside the body consistent with pseudo-thrombocytopenia, however SLE can also produce some antibodies directed against platelets consistent with immune thrombocytopenia -Discussed that the pathologist's smear review noted platelet clumping, which would be consistent with pseudo-thrombocytopenia -If pt has immune component, would not treat unless PLT were closer to 30k  -Recommend minimizing NSAIDs -Discussed that the pt is not at overt risk of traumatic bleeding nor spontaneous bleeding with her current PLT counts -Will order labs today including PLT in citrate - plt count stable at 99k.  -col dhave an immune component - will rpt check in 6 months B12 levels low at 274 -- would recommend SL B12 1000 mcg daily  RTC with Dr Irene Limbo with labs in 6 months  All of the patients questions were answered with apparent satisfaction. The patient knows to call the clinic with any problems, questions or concerns.  The total time spent in the appt was 45 minutes and more than 50% was on counseling and direct patient cares.    Sullivan Lone MD MS AAHIVMS [facility] Hematology/Oncology Physician [facility]  (Office):       [phone] (Work cell):  [phone] (Fax):           [phone]  02/26/2019 10:32 AM  I, Baldwin [place], am acting as a scribe for Dr. Sullivan Lone.   .I have reviewed the above documentation for accuracy and completeness, and I agree with the above. Brunetta Genera MD

## 2019-02-26 ENCOUNTER — Other Ambulatory Visit: Payer: Self-pay

## 2019-02-26 ENCOUNTER — Inpatient Hospital Stay: Payer: BC Managed Care – PPO | Attending: Hematology | Admitting: Hematology

## 2019-02-26 ENCOUNTER — Inpatient Hospital Stay: Payer: BC Managed Care – PPO

## 2019-02-26 VITALS — BP 124/83 | HR 71 | Temp 98.4°F | Resp 17 | Ht 65.0 in | Wt 215.8 lb

## 2019-02-26 DIAGNOSIS — E538 Deficiency of other specified B group vitamins: Secondary | ICD-10-CM | POA: Diagnosis not present

## 2019-02-26 DIAGNOSIS — D696 Thrombocytopenia, unspecified: Secondary | ICD-10-CM

## 2019-02-26 LAB — CBC WITH DIFFERENTIAL/PLATELET
Abs Immature Granulocytes: 0.01 10*3/uL (ref 0.00–0.07)
Basophils Absolute: 0 10*3/uL (ref 0.0–0.1)
Basophils Relative: 0 %
Eosinophils Absolute: 0.1 10*3/uL (ref 0.0–0.5)
Eosinophils Relative: 1 %
HCT: 35.8 % — ABNORMAL LOW (ref 36.0–46.0)
Hemoglobin: 11.7 g/dL — ABNORMAL LOW (ref 12.0–15.0)
Immature Granulocytes: 0 %
Lymphocytes Relative: 25 %
Lymphs Abs: 1 10*3/uL (ref 0.7–4.0)
MCH: 26 pg (ref 26.0–34.0)
MCHC: 32.7 g/dL (ref 30.0–36.0)
MCV: 79.6 fL — ABNORMAL LOW (ref 80.0–100.0)
Monocytes Absolute: 0.3 10*3/uL (ref 0.1–1.0)
Monocytes Relative: 7 %
Neutro Abs: 2.5 10*3/uL (ref 1.7–7.7)
Neutrophils Relative %: 67 %
Platelets: 99 10*3/uL — ABNORMAL LOW (ref 150–400)
RBC: 4.5 MIL/uL (ref 3.87–5.11)
RDW: 13 % (ref 11.5–15.5)
WBC: 3.8 10*3/uL — ABNORMAL LOW (ref 4.0–10.5)
nRBC: 0 % (ref 0.0–0.2)

## 2019-02-26 LAB — CMP (CANCER CENTER ONLY)
ALT: 32 U/L (ref 0–44)
AST: 36 U/L (ref 15–41)
Albumin: 3.9 g/dL (ref 3.5–5.0)
Alkaline Phosphatase: 94 U/L (ref 38–126)
Anion gap: 8 (ref 5–15)
BUN: 15 mg/dL (ref 6–20)
CO2: 26 mmol/L (ref 22–32)
Calcium: 9.2 mg/dL (ref 8.9–10.3)
Chloride: 107 mmol/L (ref 98–111)
Creatinine: 1.07 mg/dL — ABNORMAL HIGH (ref 0.44–1.00)
GFR, Est AFR Am: >60 mL/min (ref >60)
GFR, Estimated: 56 mL/min — ABNORMAL LOW (ref >60)
Glucose, Bld: 102 mg/dL — ABNORMAL HIGH (ref 70–99)
Potassium: 3.1 mmol/L — ABNORMAL LOW (ref 3.5–5.1)
Sodium: 141 mmol/L (ref 135–145)
Total Bilirubin: 0.4 mg/dL (ref 0.3–1.2)
Total Protein: 7.9 g/dL (ref 6.5–8.1)

## 2019-02-26 LAB — SAVE SMEAR(SSMR), FOR PROVIDER SLIDE REVIEW

## 2019-02-26 LAB — PLATELET BY CITRATE

## 2019-02-26 LAB — VITAMIN B12: Vitamin B-12: 274 pg/mL (ref 180–914)

## 2019-02-27 ENCOUNTER — Telehealth: Payer: Self-pay | Admitting: Hematology

## 2019-02-27 LAB — HEPATITIS C ANTIBODY: HCV Ab: <0.1 s/co ratio (ref 0.0–0.9)

## 2019-02-27 NOTE — Telephone Encounter (Signed)
No los per 5/7. °

## 2019-03-05 ENCOUNTER — Telehealth: Payer: Self-pay | Admitting: *Deleted

## 2019-03-05 NOTE — Telephone Encounter (Signed)
Contacted patient regarding test results per Dr. Grier Mitts directions: Please let patient know her B12 levels as are low -- she is recommended to start taking OTC SL B12 1000 mcg daily.  Platelet stable at 99k - likely related to SLE causing some immune thrombocytopenia -- Please schedule patient for f/u in 6 months with labs. Informed patient that scheduling will contact her to make f/u appts. Encouraged to contact office for concerns.  Patient verbalized understanding of all information.

## 2019-03-09 ENCOUNTER — Telehealth: Payer: Self-pay | Admitting: Hematology

## 2019-03-09 NOTE — Telephone Encounter (Signed)
Scheduled appt per sch msg. Mailed printout  °

## 2019-06-30 ENCOUNTER — Other Ambulatory Visit: Payer: Self-pay | Admitting: Obstetrics and Gynecology

## 2019-06-30 DIAGNOSIS — Z1231 Encounter for screening mammogram for malignant neoplasm of breast: Secondary | ICD-10-CM

## 2019-07-14 ENCOUNTER — Ambulatory Visit (INDEPENDENT_AMBULATORY_CARE_PROVIDER_SITE_OTHER): Payer: BC Managed Care – PPO

## 2019-07-14 DIAGNOSIS — Z23 Encounter for immunization: Secondary | ICD-10-CM

## 2019-07-23 ENCOUNTER — Telehealth: Payer: Self-pay | Admitting: Hematology

## 2019-07-23 ENCOUNTER — Ambulatory Visit (INDEPENDENT_AMBULATORY_CARE_PROVIDER_SITE_OTHER): Payer: BC Managed Care – PPO | Admitting: Internal Medicine

## 2019-07-23 DIAGNOSIS — J0110 Acute frontal sinusitis, unspecified: Secondary | ICD-10-CM | POA: Diagnosis not present

## 2019-07-23 MED ORDER — AMOXICILLIN-POT CLAVULANATE 875-125 MG PO TABS: 1.0000 | ORAL_TABLET | Freq: Two times a day (BID) | ORAL | 0 refills | Status: DC

## 2019-07-23 NOTE — Progress Notes (Signed)
Virtual Visit via Video Note  I connected with Jennifer Rose on 07/23/19 at  2:00 PM EDT by a video enabled telemedicine application and verified that I am speaking with the correct person using two identifiers.  Location: Patient: Home Provider: Office   I discussed the limitations of evaluation and management by telemedicine and the availability of in person appointments. The patient expressed understanding and agreed to proceed.  History of Present Illness:  Patient reports that on Sunday she began feeling symptoms of sinusitis, including frontal sinus pressure, headache, nasal congestion, and intermittent productive cough. Today she also developed a mild sore throat. Patient also reported a subjective fever "one night" earlier this week. She has taken an OTC antihistamine with little relief. She has had this constellation of symptoms several times in the past and states that she usually requests Amoxicillin "before a deep" infection.  She does not smoke.  She has not had sick contacts that she is aware of.   HPI    Review of Systems     Past Medical History:  Diagnosis Date  . Allergy    allergic rhinitis  . Hypertension 03/22/2004  . Lupus ([place])   . Uterine fibroid 11/01   per GYN    No family history on file.  Social History   Socioeconomic History  . Marital status: Divorced    Spouse name: Not on file  . Number of children: Not on file  . Years of education: Not on file  . Highest education level: Not on file  Occupational History  . Not on file  Social Needs  . Financial resource strain: Not on file  . Food insecurity    Worry: Not on file    Inability: Not on file  . Transportation needs    Medical: Not on file    Non-medical: Not on file  Tobacco Use  . Smoking status: Never Smoker  . Smokeless tobacco: Never Used  Substance and Sexual Activity  . Alcohol use: No    Alcohol/week: 0.0 standard drinks  . Drug use: No  . Sexual activity: Not on file   Lifestyle  . Physical activity    Days per week: Not on file    Minutes per session: Not on file  . Stress: Not on file  Relationships  . Social Herbalist on phone: Not on file    Gets together: Not on file    Attends religious service: Not on file    Active member of club or organization: Not on file    Attends meetings of clubs or organizations: Not on file    Relationship status: Not on file  . Intimate partner violence    Fear of current or ex partner: Not on file    Emotionally abused: Not on file    Physically abused: Not on file    Forced sexual activity: Not on file  Other Topics Concern  . Not on file  Social History Narrative  . Not on file    No Known Allergies   Constitutional: Positive headache, fatigue.. Denies fever, abrupt weight changes.  HEENT:  Positive eye pain, facial pain, nasal congestion and sore throat. Denies eye redness, ear pain, ringing in the ears, wax buildup, runny nose or bloody nose. Respiratory: Positive cough. Denies difficulty breathing or shortness of breath.  Cardiovascular: Denies chest pain, chest tightness, palpitations or swelling in the hands or feet.   No other specific complaints in a complete review of  systems (except as listed in HPI above).  Objective:    General: Appears her stated age, in NAD.  Pulmonary/Chest: Normal effortNo respiratory distress.       Assessment & Plan:   Acute Frontal Sinusitis  Can use a Neti Pot which can be purchased from your local drug store. Flonase 2 sprays each nostril for 3 days and then as needed. eRx for Augmentin BID for 10 days  RTC as needed or if symptoms persist. Webb Silversmith, NP  Follow Up Instructions:    I discussed the assessment and treatment plan with the patient. The patient was provided an opportunity to ask questions and all were answered. The patient agreed with the plan and demonstrated an understanding of the instructions.   The patient was advised  to call back or seek an in-person evaluation if the symptoms worsen or if the condition fails to improve as anticipated.    Webb Silversmith, NP

## 2019-07-23 NOTE — Telephone Encounter (Signed)
Returned patient's phone call regarding rescheduling an appointment, left a voicemail. 

## 2019-07-24 ENCOUNTER — Telehealth: Payer: Self-pay | Admitting: Hematology

## 2019-07-24 NOTE — Telephone Encounter (Signed)
Patient called to reschedule November appointments, per patient's request appointment has been moved to 12/03.

## 2019-07-25 ENCOUNTER — Encounter: Payer: Self-pay | Admitting: Internal Medicine

## 2019-07-25 NOTE — Patient Instructions (Signed)

## 2019-08-11 ENCOUNTER — Ambulatory Visit
Admission: RE | Admit: 2019-08-11 | Discharge: 2019-08-11 | Disposition: A | Discharge status: Home / Self Care | Payer: BC Managed Care – PPO | Source: Ambulatory Visit | Attending: Obstetrics and Gynecology | Admitting: Obstetrics and Gynecology

## 2019-08-11 ENCOUNTER — Other Ambulatory Visit: Payer: Self-pay

## 2019-08-11 DIAGNOSIS — Z1231 Encounter for screening mammogram for malignant neoplasm of breast: Secondary | ICD-10-CM

## 2019-08-11 LAB — HM MAMMOGRAPHY

## 2019-08-12 ENCOUNTER — Encounter: Payer: Self-pay | Admitting: Family Medicine

## 2019-08-12 ENCOUNTER — Telehealth: Payer: Self-pay

## 2019-08-12 NOTE — Telephone Encounter (Signed)
Left message for patient to call back about results. 

## 2019-08-12 NOTE — Telephone Encounter (Signed)
Patient advised.

## 2019-08-14 ENCOUNTER — Other Ambulatory Visit: Payer: Self-pay

## 2019-08-14 DIAGNOSIS — Z20822 Contact with and (suspected) exposure to covid-19: Secondary | ICD-10-CM

## 2019-08-15 LAB — NOVEL CORONAVIRUS, NAA: SARS-CoV-2, NAA: NOT DETECTED

## 2019-09-09 ENCOUNTER — Ambulatory Visit: Payer: BC Managed Care – PPO | Admitting: Hematology

## 2019-09-09 ENCOUNTER — Other Ambulatory Visit: Payer: BC Managed Care – PPO

## 2019-09-22 ENCOUNTER — Other Ambulatory Visit: Payer: Self-pay | Admitting: Family Medicine

## 2019-09-22 NOTE — Telephone Encounter (Signed)
She is due for a physical  Please schedule in winter or early spring and refill until then

## 2019-09-22 NOTE — Telephone Encounter (Signed)
Pt has had multiple acute appts but no recent or future f/u or CPE appts scheduled, please advise

## 2019-09-23 NOTE — Telephone Encounter (Signed)
Med refilled once and Carrie will reach out to pt to try and get CPE scheduled  

## 2019-09-24 ENCOUNTER — Inpatient Hospital Stay: Payer: BC Managed Care – PPO | Attending: Hematology

## 2019-09-24 ENCOUNTER — Inpatient Hospital Stay (HOSPITAL_BASED_OUTPATIENT_CLINIC_OR_DEPARTMENT_OTHER): Payer: BC Managed Care – PPO | Admitting: Hematology

## 2019-09-24 ENCOUNTER — Telehealth: Payer: Self-pay | Admitting: Hematology

## 2019-09-24 ENCOUNTER — Other Ambulatory Visit: Payer: Self-pay

## 2019-09-24 VITALS — BP 152/94 | HR 70 | Temp 97.8°F | Resp 18 | Ht 65.0 in | Wt 215.5 lb

## 2019-09-24 DIAGNOSIS — E538 Deficiency of other specified B group vitamins: Secondary | ICD-10-CM

## 2019-09-24 DIAGNOSIS — D696 Thrombocytopenia, unspecified: Secondary | ICD-10-CM | POA: Diagnosis not present

## 2019-09-24 LAB — CMP (CANCER CENTER ONLY)
ALT: 37 U/L (ref 0–44)
AST: 36 U/L (ref 15–41)
Albumin: 3.8 g/dL (ref 3.5–5.0)
Alkaline Phosphatase: 93 U/L (ref 38–126)
Anion gap: 8 (ref 5–15)
BUN: 21 mg/dL (ref 8–23)
CO2: 25 mmol/L (ref 22–32)
Calcium: 9.4 mg/dL (ref 8.9–10.3)
Chloride: 107 mmol/L (ref 98–111)
Creatinine: 1.14 mg/dL — ABNORMAL HIGH (ref 0.44–1.00)
GFR, Est AFR Am: >60 mL/min (ref >60)
GFR, Estimated: 52 mL/min — ABNORMAL LOW (ref >60)
Glucose, Bld: 125 mg/dL — ABNORMAL HIGH (ref 70–99)
Potassium: 4.1 mmol/L (ref 3.5–5.1)
Sodium: 140 mmol/L (ref 135–145)
Total Bilirubin: 0.4 mg/dL (ref 0.3–1.2)
Total Protein: 7.7 g/dL (ref 6.5–8.1)

## 2019-09-24 LAB — CBC WITH DIFFERENTIAL/PLATELET
Abs Immature Granulocytes: 0.01 10*3/uL (ref 0.00–0.07)
Basophils Absolute: 0 10*3/uL (ref 0.0–0.1)
Basophils Relative: 0 %
Eosinophils Absolute: 0 10*3/uL (ref 0.0–0.5)
Eosinophils Relative: 0 %
HCT: 37.8 % (ref 36.0–46.0)
Hemoglobin: 12.3 g/dL (ref 12.0–15.0)
Immature Granulocytes: 0 %
Lymphocytes Relative: 17 %
Lymphs Abs: 0.9 10*3/uL (ref 0.7–4.0)
MCH: 26.6 pg (ref 26.0–34.0)
MCHC: 32.5 g/dL (ref 30.0–36.0)
MCV: 81.8 fL (ref 80.0–100.0)
Monocytes Absolute: 0.4 10*3/uL (ref 0.1–1.0)
Monocytes Relative: 8 %
Neutro Abs: 3.8 10*3/uL (ref 1.7–7.7)
Neutrophils Relative %: 75 %
Platelets: 125 10*3/uL — ABNORMAL LOW (ref 150–400)
RBC: 4.62 MIL/uL (ref 3.87–5.11)
RDW: 12.7 % (ref 11.5–15.5)
WBC: 5.1 10*3/uL (ref 4.0–10.5)
nRBC: 0 % (ref 0.0–0.2)

## 2019-09-24 LAB — VITAMIN B12: Vitamin B-12: 510 pg/mL (ref 180–914)

## 2019-09-24 NOTE — Telephone Encounter (Signed)
Per 12/3 los RTC with Dr Irene Limbo as needed

## 2019-09-24 NOTE — Progress Notes (Signed)
HEMATOLOGY/ONCOLOGY CONSULTATION NOTE  Date of Service: 09/24/2019  Patient Care Team: Tower, Wynelle Fanny, MD as PCP - General Leigh Aurora, MD as Rheumatologist  CHIEF COMPLAINTS/PURPOSE OF CONSULTATION:  Thrombocytopenia   HISTORY OF PRESENTING ILLNESS:   Jennifer Rose is a wonderful 63 y.o. female who has been referred to [place] by Dr. Leigh Aurora at [facility] for evaluation and management of Thrombocytopenia. The pt reports that she is doing well overall.  The pt reports that she had the flu in November 2019, and has known of lower platelet counts most acutely since then. The pt has been taking Augmentin for the last week for a sinus infection with her PCP. She notes that she has SLE. The pt notes that she is prone to migraines, and takes Advil for these. Reports taking Advil not very frequently, and notes migraines have been becoming less frequent. Endorses triggers of red food coloring, cheese, bananas, and something used in doughnuts. She has been staying away from these triggers.   The pt has been taking Plaquenil for 25 years. She is compliant with twice per year ophthalmology visits. She denies lung, heart, or kidney problems. She denies thyroid issues and also denies liver disease.   The pt denies any abnormal bruises and denies concerns for abnormal bruising. She denies nose bleeds or gum bleeds.  Most recent lab results (12/25/18) of CBC w/diff is as follows: all values are WNL except for PLT at 102k.  On review of systems, pt reports recent sinus infection, good energy levels, and denies nose bleeds, gum bleeds, abnormal bruising, fevers, unexpected weight loss, chills, night sweats, mouth sores, noticing any new lumps or bumps, abdominal pains, leg swelling, lower abdominal pains, swollen/painful joints, skin rashes, and any other symptoms.  On PMHx the pt reports Systemic lupus erythematosis, Raynaud's disease, HTN, migraines, thrombocytopenia, denies liver  disease. On Social Hx the pt denies concern for excessive alcohol use. Denies smoking cigarettes. Denies chemical exposure. On Family Hx the pt denies blood disorders.  INTERVAL HISTORY:  Jennifer Rose is a 63 y.o. female here for evaluation and management of Thrombocytopenia. The patient's last visit with [place] was on 02/26/2019. The pt reports that she is doing well overall.  The pt reports that she is doing well.  She is taking B12 and Calcium supplements.   She is not using as much Advil for her migraines.  Lab results today 09/24/19 of CBC w/diff and CMP is as follows: all values are WNL except for Platelets at 125, Glucose Bld at 125, Creatinine at 1.14, GFR Est Non Af Am at 52.  Vitamin B12.- 510  On review of systems, pt reports doing well and denies bleeding, bruising, abdominal pain and any other symptoms.     MEDICAL HISTORY:  Past Medical History:  Diagnosis Date  . Allergy    allergic rhinitis  . Hypertension 03/22/2004  . Lupus ([place])   . Uterine fibroid 11/01   per GYN    SURGICAL HISTORY: Past Surgical History:  Procedure Laterality Date  . BREAST CYST EXCISION Right 36 yrs ago   x2  . ccy    . ENDOMETRIAL ABLATION  04/03   polyps?DUB    SOCIAL HISTORY: Social History   Socioeconomic History  . Marital status: Divorced    Spouse name: Not on file  . Number of children: Not on file  . Years of education: Not on file  . Highest education level: Not on file  Occupational History  .  Not on file  Social Needs  . Financial resource strain: Not on file  . Food insecurity    Worry: Not on file    Inability: Not on file  . Transportation needs    Medical: Not on file    Non-medical: Not on file  Tobacco Use  . Smoking status: Never Smoker  . Smokeless tobacco: Never Used  Substance and Sexual Activity  . Alcohol use: No    Alcohol/week: 0.0 standard drinks  . Drug use: No  . Sexual activity: Not on file  Lifestyle  . Physical activity    Days  per week: Not on file    Minutes per session: Not on file  . Stress: Not on file  Relationships  . Social Herbalist on phone: Not on file    Gets together: Not on file    Attends religious service: Not on file    Active member of club or organization: Not on file    Attends meetings of clubs or organizations: Not on file    Relationship status: Not on file  . Intimate partner violence    Fear of current or ex partner: Not on file    Emotionally abused: Not on file    Physically abused: Not on file    Forced sexual activity: Not on file  Other Topics Concern  . Not on file  Social History Narrative  . Not on file    FAMILY HISTORY: No family history on file.  ALLERGIES:  has No Known Allergies.  MEDICATIONS:  Current Outpatient Medications  Medication Sig Dispense Refill  . amLODipine (NORVASC) 5 MG tablet Take 1 tablet (5 mg total) by mouth daily. 90 tablet 3  . amoxicillin-clavulanate (AUGMENTIN) 875-125 MG tablet Take 1 tablet by mouth 2 (two) times daily. 20 tablet 0  . aspirin 81 MG tablet Take 81 mg by mouth daily.      . cholecalciferol (VITAMIN D) 1000 UNITS tablet Take 2,000 Units by mouth daily.      . fexofenadine (ALLEGRA ALLERGY) 180 MG tablet Take 1 tablet (180 mg total) by mouth daily. 30 tablet 1  . fluticasone (FLONASE) 50 MCG/ACT nasal spray Place 1-2 sprays into both nostrils daily. 16 g 5  . hydroxychloroquine (PLAQUENIL) 200 MG tablet Take 200 mg by mouth 2 (two) times daily.      Marland Kitchen latanoprost (XALATAN) 0.005 % ophthalmic solution Place 1 drop into both eyes as directed.      Marland Kitchen losartan-hydrochlorothiazide (HYZAAR) 50-12.5 MG tablet TAKE 1 TABLET BY MOUTH ONCE A DAY 90 tablet 0  . meclizine (ANTIVERT) 12.5 MG tablet Take 12.5 mg by mouth 3 (three) times daily as needed.      . potassium chloride (K-DUR) 10 MEQ tablet Take 2 tablets (20 mEq total) by mouth daily. 180 tablet 3   No current facility-administered medications for this visit.      REVIEW OF SYSTEMS:    A 10+ POINT REVIEW OF SYSTEMS WAS OBTAINED including neurology, dermatology, psychiatry, cardiac, respiratory, lymph, extremities, GI, GU, Musculoskeletal, constitutional, breasts, reproductive, HEENT.  All pertinent positives are noted in the HPI.  All others are negative.   PHYSICAL EXAMINATION: ECOG FS:1 - Symptomatic but completely ambulatory  Vitals:   09/24/19 0922  BP: (!) 152/94  Pulse: 70  Resp: 18  Temp: 97.8 F (36.6 C)  SpO2: 100%   Wt Readings from Last 3 Encounters:  09/24/19 215 lb 8 oz (97.8 kg)  02/26/19 215 lb  12.8 oz (97.9 kg)  09/02/18 215 lb 8 oz (97.8 kg)   Body mass index is 35.86 kg/m.    GENERAL:alert, in no acute distress and comfortable SKIN: no acute rashes, no significant lesions EYES: conjunctiva are pink and non-injected, sclera anicteric OROPHARYNX: MMM, no exudates, no oropharyngeal erythema or ulceration NECK: supple, no JVD LYMPH:  no palpable lymphadenopathy in the cervical, axillary or inguinal regions LUNGS: clear to auscultation b/l with normal respiratory effort HEART: regular rate & rhythm ABDOMEN:  normoactive bowel sounds , non tender, not distended. Extremity: no pedal edema PSYCH: alert & oriented x 3 with fluent speech NEURO: no focal motor/sensory deficits   LABORATORY DATA:  I have reviewed the data as listed  . CBC Latest Ref Rng & Units 09/24/2019 02/26/2019 12/04/2018  WBC 4.0 - 10.5 K/uL 5.1 3.8(L) 4.6  Hemoglobin 12.0 - 15.0 g/dL 12.3 11.7(L) 12.4  Hematocrit 36.0 - 46.0 % 37.8 35.8(L) 37.4  Platelets 150 - 400 K/uL 125(L) 99(L) 100.0(L)    . CMP Latest Ref Rng & Units 09/24/2019 02/26/2019 12/04/2018  Glucose 70 - 99 mg/dL 125(H) 102(H) 124(H)  BUN 8 - 23 mg/dL 21 15 14   Creatinine 0.44 - 1.00 mg/dL 1.14(H) 1.07(H) 1.05  Sodium 135 - 145 mmol/L 140 141 139  Potassium 3.5 - 5.1 mmol/L 4.1 3.1(L) 3.5  Chloride 98 - 111 mmol/L 107 107 104  CO2 22 - 32 mmol/L 25 26 27   Calcium 8.9 - 10.3 mg/dL  9.4 9.2 9.0  Total Protein 6.5 - 8.1 g/dL 7.7 7.9 7.2  Total Bilirubin 0.3 - 1.2 mg/dL 0.4 0.4 0.4  Alkaline Phos 38 - 126 U/L 93 94 86  AST 15 - 41 U/L 36 36 27  ALT 0 - 44 U/L 37 32 21   Component     Latest Ref Rng & Units 02/26/2019  Vitamin B12     180 - 914 pg/mL 274  HCV Ab     0.0 - 0.9 s/co ratio <0.1    12/25/18 CBC w/diff:    RADIOGRAPHIC STUDIES: I have personally reviewed the radiological images as listed and agreed with the findings in the report. No results found.  ASSESSMENT & PLAN:   63 y.o. female with  1. Thrombocytopenia  PLAN -Discussed pt labwork today, 09/24/19; all values are WNL except for Platelets at 125, Glucose Bld at 125, Creatinine at 1.14, GFR Est Non Af Am at 52.   Vitamin B12 - 510 -Discussed Platelets at 125, (09/24/19) and are improving (upfrom 99k) -Discussed Blood chemistries have improved, pt is no longer anemic.  -Advised pt to take tylenol for migraines instead of Advil  -Discussed pt to continue to follow up with Dr. Glori Bickers to monitor Platelets . Reconsult [place] is PLT<70k  FOLLOW UP: RTC with Dr Irene Limbo as needed  The total time spent in the appt was 15 minutes and more than 50% was on counseling and direct patient cares.  All of the patient's questions were answered with apparent satisfaction. The patient knows to call the clinic with any problems, questions or concerns.     Sullivan Lone MD MS AAHIVMS [facility] Hematology/Oncology Physician [facility]  (Office):       [phone] (Work cell):  [phone] (Fax):           [phone]  09/24/2019 6:33 AM  I, Scot Dock, am acting as a scribe for Dr. Sullivan Lone.   .I have reviewed the above documentation for accuracy and completeness, and  I agree with the above. Brunetta Genera MD

## 2019-11-02 ENCOUNTER — Telehealth: Payer: Self-pay | Admitting: Family Medicine

## 2019-11-02 DIAGNOSIS — D696 Thrombocytopenia, unspecified: Secondary | ICD-10-CM

## 2019-11-02 DIAGNOSIS — R7303 Prediabetes: Secondary | ICD-10-CM

## 2019-11-02 DIAGNOSIS — I10 Essential (primary) hypertension: Secondary | ICD-10-CM

## 2019-11-02 NOTE — Telephone Encounter (Signed)
-----   Message from Ellamae Sia sent at 10/27/2019  2:43 PM EST ----- Regarding: Lab orders for Wednesday, 1.13.21 Patient is scheduled for CPX labs, please order future labs, Thanks , Karna Christmas

## 2019-11-04 ENCOUNTER — Other Ambulatory Visit: Payer: Self-pay

## 2019-11-04 ENCOUNTER — Other Ambulatory Visit (INDEPENDENT_AMBULATORY_CARE_PROVIDER_SITE_OTHER): Payer: BC Managed Care – PPO

## 2019-11-04 DIAGNOSIS — D696 Thrombocytopenia, unspecified: Secondary | ICD-10-CM | POA: Diagnosis not present

## 2019-11-04 DIAGNOSIS — R7303 Prediabetes: Secondary | ICD-10-CM

## 2019-11-04 DIAGNOSIS — I10 Essential (primary) hypertension: Secondary | ICD-10-CM

## 2019-11-04 LAB — CBC WITH DIFFERENTIAL/PLATELET
Basophils Absolute: 0 10*3/uL (ref 0.0–0.1)
Basophils Relative: 0.3 % (ref 0.0–3.0)
Eosinophils Absolute: 0 10*3/uL (ref 0.0–0.7)
Eosinophils Relative: 0.1 % (ref 0.0–5.0)
HCT: 37.7 % (ref 36.0–46.0)
Hemoglobin: 12.5 g/dL (ref 12.0–15.0)
Lymphocytes Relative: 14.2 % (ref 12.0–46.0)
Lymphs Abs: 0.9 10*3/uL (ref 0.7–4.0)
MCHC: 33.3 g/dL (ref 30.0–36.0)
MCV: 80.9 fl (ref 78.0–100.0)
Monocytes Absolute: 0.4 10*3/uL (ref 0.1–1.0)
Monocytes Relative: 6.9 % (ref 3.0–12.0)
Neutro Abs: 4.9 10*3/uL (ref 1.4–7.7)
Neutrophils Relative %: 78.5 % — ABNORMAL HIGH (ref 43.0–77.0)
Platelets: 115 10*3/uL — ABNORMAL LOW (ref 150.0–400.0)
RBC: 4.66 Mil/uL (ref 3.87–5.11)
RDW: 13.4 % (ref 11.5–15.5)
WBC: 6.3 10*3/uL (ref 4.0–10.5)

## 2019-11-04 LAB — COMPREHENSIVE METABOLIC PANEL
ALT: 27 U/L (ref 0–35)
AST: 27 U/L (ref 0–37)
Albumin: 4.1 g/dL (ref 3.5–5.2)
Alkaline Phosphatase: 87 U/L (ref 39–117)
BUN: 20 mg/dL (ref 6–23)
CO2: 26 mEq/L (ref 19–32)
Calcium: 9.5 mg/dL (ref 8.4–10.5)
Chloride: 103 mEq/L (ref 96–112)
Creatinine, Ser: 1.17 mg/dL (ref 0.40–1.20)
GFR: 56.78 mL/min — ABNORMAL LOW (ref >60.00)
Glucose, Bld: 148 mg/dL — ABNORMAL HIGH (ref 70–99)
Potassium: 3.7 mEq/L (ref 3.5–5.1)
Sodium: 137 mEq/L (ref 135–145)
Total Bilirubin: 0.4 mg/dL (ref 0.2–1.2)
Total Protein: 7.7 g/dL (ref 6.0–8.3)

## 2019-11-04 LAB — LIPID PANEL
Cholesterol: 160 mg/dL (ref 0–200)
HDL: 39.3 mg/dL (ref >39.00)
LDL Cholesterol: 105 mg/dL — ABNORMAL HIGH (ref 0–99)
NonHDL: 120.76
Total CHOL/HDL Ratio: 4
Triglycerides: 81 mg/dL (ref 0.0–149.0)
VLDL: 16.2 mg/dL (ref 0.0–40.0)

## 2019-11-04 LAB — TSH: TSH: 1.38 u[IU]/mL (ref 0.35–4.50)

## 2019-11-04 LAB — HEMOGLOBIN A1C: Hgb A1c MFr Bld: 6.5 % (ref 4.6–6.5)

## 2019-11-12 ENCOUNTER — Ambulatory Visit (INDEPENDENT_AMBULATORY_CARE_PROVIDER_SITE_OTHER): Payer: BC Managed Care – PPO | Admitting: Family Medicine

## 2019-11-12 ENCOUNTER — Encounter: Payer: Self-pay | Admitting: Family Medicine

## 2019-11-12 ENCOUNTER — Other Ambulatory Visit: Payer: Self-pay

## 2019-11-12 VITALS — BP 120/75 | HR 78 | Temp 96.9°F | Ht 65.0 in | Wt 212.2 lb

## 2019-11-12 DIAGNOSIS — Z1211 Encounter for screening for malignant neoplasm of colon: Secondary | ICD-10-CM

## 2019-11-12 DIAGNOSIS — R7303 Prediabetes: Secondary | ICD-10-CM | POA: Diagnosis not present

## 2019-11-12 DIAGNOSIS — Z0000 Encounter for general adult medical examination without abnormal findings: Secondary | ICD-10-CM

## 2019-11-12 DIAGNOSIS — M3219 Other organ or system involvement in systemic lupus erythematosus: Secondary | ICD-10-CM

## 2019-11-12 DIAGNOSIS — I10 Essential (primary) hypertension: Secondary | ICD-10-CM

## 2019-11-12 DIAGNOSIS — E66812 Obesity, class 2: Secondary | ICD-10-CM

## 2019-11-12 DIAGNOSIS — D696 Thrombocytopenia, unspecified: Secondary | ICD-10-CM

## 2019-11-12 DIAGNOSIS — Z6835 Body mass index (BMI) 35.0-35.9, adult: Secondary | ICD-10-CM

## 2019-11-12 MED ORDER — LOSARTAN POTASSIUM-HCTZ 50-12.5 MG PO TABS: 1.0000 | ORAL_TABLET | Freq: Every day | ORAL | 3 refills | Status: DC

## 2019-11-12 MED ORDER — AMLODIPINE BESYLATE 5 MG PO TABS: 5.0000 mg | ORAL_TABLET | Freq: Every day | ORAL | 3 refills | Status: DC

## 2019-11-12 MED ORDER — POTASSIUM CHLORIDE ER 10 MEQ PO TBCR: 20.0000 meq | EXTENDED_RELEASE_TABLET | Freq: Every day | ORAL | 3 refills | Status: DC

## 2019-11-12 NOTE — Assessment & Plan Note (Addendum)
Reviewed health habits including diet and exercise and skin cancer prevention Reviewed appropriate screening tests for age  Also reviewed health mt list, fam hx and immunization status , as well as social and family history   See HPI Labs reviewed  Pt will need Tetanus shot (she will wait until after covid vaccines so she does not have to delay those) Also interested in shingrix later if covered  [facility] gyn care Referred for screening colonoscopy

## 2019-11-12 NOTE — Assessment & Plan Note (Signed)
Sees Dr Amil Amen (rheumatology) and also opth for her plaquenil  Doing fairly well  Had to have one rescue prednisone course  Continue to watch cbc  If platelets go below 70 will need to return to hematology

## 2019-11-12 NOTE — Assessment & Plan Note (Signed)
bp in fair control at this time  BP Readings from Last 1 Encounters:  11/12/19 120/75   No changes needed Most recent labs reviewed  Disc lifstyle change with low sodium diet and exercise

## 2019-11-12 NOTE — Assessment & Plan Note (Signed)
Discussed how this problem influences overall health and the risks it imposes  Reviewed plan for weight loss with lower calorie diet (via better food choices and also portion control or program like weight watchers) and exercise building up to or more than 30 minutes 5 days per week including some aerobic activity    

## 2019-11-12 NOTE — Progress Notes (Signed)
Subjective:    Patient ID: Jennifer Rose, female    DOB: Feb 20, 1956, 64 y.o.   MRN: [id number]  This visit occurred during the SARS-CoV-2 public health emergency.  Safety protocols were in place, including screening questions prior to the visit, additional usage of staff PPE, and extensive cleaning of exam room while observing appropriate contact time as indicated for disinfecting solutions.    HPI  Here for health maintenance exam and to review chronic medical problems    Wt Readings from Last 3 Encounters:  11/12/19 212 lb 3 oz (96.2 kg)  09/24/19 215 lb 8 oz (97.8 kg)  02/26/19 215 lb 12.8 oz (97.9 kg)   35.31 kg/m   Tetanus vaccine  - does not know when her last one ws  Flu vaccine 9/20  Zoster status -may be interested in shingrix   Colonoscopy 3/10 - she knows she is due  (it was originally due during the shut down)   Mammogram 10/20 Self breast exam -no lumps or changes   Pap 12/19 neg /gyn (Dr Ubaldo Glassing) Saw Dr Marvel Plan 10/21/19 - did not do one this year   bp is stable today -2nd check normal BP: 120/75   No cp or palpitations or headaches or edema  No side effects to medicines  BP Readings from Last 3 Encounters:  09/24/19 (!) 152/94  02/26/19 124/83  09/02/18 112/66  amlodipine  losrtn hct  K dur 20 meq daily    Lab Results  Component Value Date   CREATININE 1.17 11/04/2019   BUN 20 11/04/2019   NA 137 11/04/2019   K 3.7 11/04/2019   CL 103 11/04/2019   CO2 26 11/04/2019   Lab Results  Component Value Date   ALT 27 11/04/2019   AST 27 11/04/2019   ALKPHOS 87 11/04/2019   BILITOT 0.4 11/04/2019   Lab Results  Component Value Date   WBC 6.3 11/04/2019   HGB 12.5 11/04/2019   HCT 37.7 11/04/2019   MCV 80.9 11/04/2019   PLT 115.0 (L) 11/04/2019  thrombocytopenia -due to lupus  Rheumatology aware / has seen hematology  Dr Irene Limbo recommended return if platelets drop to 70 or below  No excessive bruising or bleeding      SLE: doing pretty good  overall  Has had prednisone once for joint soreness  Works on walking for exercise  Taking plaquenil [place] daily    Prediabetes  Lab Results  Component Value Date   HGBA1C 6.5 11/04/2019  up from 6.4 Walking for exercise  Fasting glucose 148   Diet:  Oatmeal in am or bacon/eggs  Avoiding bread No sodas or sugar drinks except sweet tea Reduced sugar in sweet tea (very little)  Avoiding pasta  Eating more vegetables  No sweets except for occ ice cream    Cholesterol  Lab Results  Component Value Date   CHOL 160 11/04/2019   CHOL 135 08/19/2018   CHOL 137 04/30/2017   Lab Results  Component Value Date   HDL 39.30 11/04/2019   HDL 34.80 (L) 08/19/2018   HDL 40.20 04/30/2017   Lab Results  Component Value Date   LDLCALC 105 (H) 11/04/2019   LDLCALC 83 08/19/2018   LDLCALC 80 04/30/2017   Lab Results  Component Value Date   TRIG 81.0 11/04/2019   TRIG 84.0 08/19/2018   TRIG 81.0 04/30/2017   Lab Results  Component Value Date   CHOLHDL 4 11/04/2019   CHOLHDL 4 08/19/2018   CHOLHDL 3 04/30/2017  No results found for: LDLDIRECT Has cut out fried foods  Has eaten some bacon and eggs   Lab Results  Component Value Date   TSH 1.38 11/04/2019    Patient Active Problem List   Diagnosis Date Noted  . Colon cancer screening 11/12/2019  . H/O migraine 08/26/2018  . Routine general medical examination at a health care facility 08/26/2018  . Hypokalemia 08/26/2018  . Thrombocytopenia ([place]) 04/30/2017  . Prediabetes 12/13/2014  . Class 2 severe obesity due to excess calories with serious comorbidity and body mass index (BMI) of 35.0 to 35.9 in adult [facility]) 12/13/2014  . GLAUCOMA 04/15/2009  . LUPUS 11/09/2008  . Allergic rhinitis, cause unspecified 09/24/2007  . Essential hypertension 03/22/2004   Past Medical History:  Diagnosis Date  . Allergy    allergic rhinitis  . Hypertension 03/22/2004  . Lupus ([place])   . Uterine fibroid 11/01   per GYN   Past  Surgical History:  Procedure Laterality Date  . BREAST CYST EXCISION Right 36 yrs ago   x2  . ccy    . ENDOMETRIAL ABLATION  04/03   polyps?DUB   Social History   Tobacco Use  . Smoking status: Never Smoker  . Smokeless tobacco: Never Used  Substance Use Topics  . Alcohol use: No    Alcohol/week: 0.0 standard drinks  . Drug use: No   History reviewed. No pertinent family history. No Known Allergies Current Outpatient Medications on File Prior to Visit  Medication Sig Dispense Refill  . aspirin 81 MG tablet Take 81 mg by mouth daily.      . cholecalciferol (VITAMIN D) 1000 UNITS tablet Take 2,000 Units by mouth daily.      . fexofenadine (ALLEGRA ALLERGY) 180 MG tablet Take 1 tablet (180 mg total) by mouth daily. (Patient taking differently: Take 180 mg by mouth daily as needed. ) 30 tablet 1  . fluticasone (FLONASE) 50 MCG/ACT nasal spray Place 1-2 sprays into both nostrils daily. (Patient taking differently: Place 1-2 sprays into both nostrils daily as needed. ) 16 g 5  . hydroxychloroquine (PLAQUENIL) 200 MG tablet Take 200 mg by mouth 2 (two) times daily.      Marland Kitchen latanoprost (XALATAN) 0.005 % ophthalmic solution Place 1 drop into both eyes as directed.      . meclizine (ANTIVERT) 12.5 MG tablet Take 12.5 mg by mouth 3 (three) times daily as needed.       No current facility-administered medications on file prior to visit.    Review of Systems  Constitutional: Negative for activity change, appetite change, fatigue, fever and unexpected weight change.  HENT: Negative for congestion, ear pain, rhinorrhea, sinus pressure and sore throat.   Eyes: Negative for pain, redness and visual disturbance.  Respiratory: Negative for cough, shortness of breath and wheezing.   Cardiovascular: Negative for chest pain and palpitations.  Gastrointestinal: Negative for abdominal pain, blood in stool, constipation and diarrhea.  Endocrine: Negative for polydipsia and polyuria.  Genitourinary:  Negative for dysuria, frequency and urgency.  Musculoskeletal: Positive for arthralgias. Negative for back pain and myalgias.  Skin: Negative for pallor and rash.  Allergic/Immunologic: Negative for environmental allergies.  Neurological: Negative for dizziness, syncope and headaches.       Occ pain over/behind R ear  Hematological: Negative for adenopathy. Does not bruise/bleed easily.  Psychiatric/Behavioral: Negative for decreased concentration and dysphoric mood. The patient is not nervous/anxious.        Objective:   Physical Exam Constitutional:  General: She is not in acute distress.    Appearance: Normal appearance. She is well-developed. She is obese. She is not ill-appearing or diaphoretic.  HENT:     Head: Normocephalic and atraumatic.     Comments: No tenderness in L temple or scalp    Right Ear: Tympanic membrane, ear canal and external ear normal.     Left Ear: Tympanic membrane, ear canal and external ear normal.     Nose: Nose normal. No congestion.     Mouth/Throat:     Mouth: Mucous membranes are moist.     Pharynx: Oropharynx is clear. No posterior oropharyngeal erythema.  Eyes:     General: No scleral icterus.    Extraocular Movements: Extraocular movements intact.     Conjunctiva/sclera: Conjunctivae normal.     Pupils: Pupils are equal, round, and reactive to light.  Neck:     Thyroid: No thyromegaly.     Vascular: No carotid bruit or JVD.  Cardiovascular:     Rate and Rhythm: Normal rate and regular rhythm.     Pulses: Normal pulses.     Heart sounds: Normal heart sounds. No gallop.   Pulmonary:     Effort: Pulmonary effort is normal. No respiratory distress.     Breath sounds: Normal breath sounds. No wheezing.     Comments: Good air exch Chest:     Chest wall: No tenderness.  Abdominal:     General: Bowel sounds are normal. There is no distension or abdominal bruit.     Palpations: Abdomen is soft. There is no mass.     Tenderness: There is  no abdominal tenderness.     Hernia: No hernia is present.  Genitourinary:    Comments: Breast exam done by gyn Musculoskeletal:        General: No tenderness. Normal range of motion.     Cervical back: Normal range of motion and neck supple. No rigidity. No muscular tenderness.     Right lower leg: No edema.     Left lower leg: No edema.  Lymphadenopathy:     Cervical: No cervical adenopathy.  Skin:    General: Skin is warm and dry.     Coloration: Skin is not pale.     Findings: No erythema or rash.     Comments: Few skin tags  Neurological:     Mental Status: She is alert. Mental status is at baseline.     Cranial Nerves: No cranial nerve deficit.     Sensory: No sensory deficit.     Motor: No abnormal muscle tone.     Coordination: Coordination normal.     Gait: Gait normal.     Deep Tendon Reflexes: Reflexes are normal and symmetric. Reflexes normal.  Psychiatric:        Mood and Affect: Mood normal.        Cognition and Memory: Cognition and memory normal.     Comments: Pleasant            Assessment & Plan:   Problem List Items Addressed This Visit      Cardiovascular and Mediastinum   Essential hypertension    bp in fair control at this time  BP Readings from Last 1 Encounters:  11/12/19 120/75   No changes needed Most recent labs reviewed  Disc lifstyle change with low sodium diet and exercise        Relevant Medications   amLODipine (NORVASC) 5 MG tablet   losartan-hydrochlorothiazide (HYZAAR) 50-12.5 MG  tablet     Other   LUPUS    Sees Dr Amil Amen (rheumatology) and also opth for her plaquenil  Doing fairly well  Had to have one rescue prednisone course  Continue to watch cbc  If platelets go below 70 will need to return to hematology      Prediabetes    Lab Results  Component Value Date   HGBA1C 6.5 11/04/2019   Pt aware this is the cut off for DM disc imp of low glycemic diet and wt loss to prevent DM2  Will work on wt loss  Re check  A1C in 3 mo      Class 2 severe obesity due to excess calories with serious comorbidity and body mass index (BMI) of 35.0 to 35.9 in adult [facility])    Discussed how this problem influences overall health and the risks it imposes  Reviewed plan for weight loss with lower calorie diet (via better food choices and also portion control or program like weight watchers) and exercise building up to or more than 30 minutes 5 days per week including some aerobic activity         Thrombocytopenia ([facility])    Reviewed last hematology notes  Low platelets are due to SLE  Overall stable at 115 inst if under 70-will need to go back to hematology No bruising or bleeding       Routine general medical examination at a health care facility - Primary    Reviewed health habits including diet and exercise and skin cancer prevention Reviewed appropriate screening tests for age  Also reviewed health mt list, fam hx and immunization status , as well as social and family history   See HPI Labs reviewed  Pt will need Tetanus shot (she will wait until after covid vaccines so she does not have to delay those) Also interested in shingrix later if covered  utd gyn care Referred for screening colonoscopy       Colon cancer screening    Referred for screening colonoscopy      Relevant Orders   Ambulatory referral to Gastroenterology

## 2019-11-12 NOTE — Assessment & Plan Note (Signed)
Reviewed last hematology notes  Low platelets are due to SLE  Overall stable at 115 inst if under 70-will need to go back to hematology No bruising or bleeding

## 2019-11-12 NOTE — Assessment & Plan Note (Signed)
Referred for screening colonoscopy 

## 2019-11-12 NOTE — Assessment & Plan Note (Signed)
Lab Results  Component Value Date   HGBA1C 6.5 11/04/2019   Pt aware this is the cut off for DM disc imp of low glycemic diet and wt loss to prevent DM2  Will work on wt loss  Re check A1C in 3 mo

## 2019-11-12 NOTE — Patient Instructions (Addendum)
I think you are due for a tetanus vaccine  Let's get that updated after the covid series  If you are interested in the new shingles vaccine (Shingrix) - call your local pharmacy to check on coverage and availability  If affordable, get on a wait list at your pharmacy to get the vaccine.  I will work on a GI referral for colonoscopy   For diabetes prevention  Try to get most of your carbohydrates from produce (with the exception of white potatoes)  Eat less bread/pasta/rice/snack foods/cereals/sweets and other items from the middle of the grocery store (processed carbs)  Start walking regularly   For cholesterol Avoid red meat/ fried foods/ egg yolks/ fatty breakfast meats/ butter, cheese and high fat dairy/ and shellfish    If head soreness does not improve or worse = let us know  Discuss also with eye doctor and rheumatologist      COVID-19 Vaccine Information can be found at: ShippingScam.co.uk For questions related to vaccine distribution or appointments, please email [email or web address] or call [phone].   We are committed to keeping you informed about the COVID-19 vaccine.  As the vaccine continues to become available for each phase, we will ensure that patients who meet the criteria receive the information they need to access vaccination opportunities. Continue to check your MyChart account and [email or web address] for updates. Please review the Phase 1b information below.  Following [place]'s guidelines for the distribution of COVID-19 vaccines we are pleased to share our plans to begin offering vaccines to those 65 and older (Phase 1b). Here are details of those plans:  [place] On Tuesday, Jan. 19, the [facility]) and [place] begin large-scale COVID-19 vaccinations at the [place]. The vaccinations are appointment only and for those 64 and older.  Walk-ins will not be accepted.  All appointments are currently filled. Please join our waiting list for the next available appointments. We will contact you when appointments become available. Please do not sign up more than once.  Join Our Waiting List   Other Vaccination Opportunities in [place] We are also working in partnership with county health agencies in our service counties to ensure continuing vaccination availability in the weeks and months ahead. Learn more about each county's vaccination efforts in the website links below:   [place]'s phase 1b vaccination guidelines, prioritizing those 65 and over as the next eligible group to receive the COVID-19 vaccine, are detailed at MobCommunity.ch.   Vaccine Safety and Effectiveness Clinical trials for the Pfizer COVID-19 vaccine involved 42,000 people and showed that the vaccine is more than 95% effective in preventing COVID-19 with no serious safety concerns. Similar results have been reported for the Moderna COVID-19 vaccine. Side effects reported in the [place] clinical trials include a sore arm at the injection site, fatigue, headache, chills and fever. While side effects from the Universal COVID-19 vaccine are higher than for a typical flu vaccine, they are lower in many ways than side effects from the leading vaccine to prevent shingles. Side effects are signs that a vaccine is working and are related to your immune system being stimulated to produce antibodies against infection. Side effects from vaccination are far less significant than health impacts from COVID-19.  Staying Informed Pharmacists, infectious disease doctors, critical care nurses and other experts at [facility] continue to speak publicly through  media interviews and direct communication with our patients and communities  about the safety, effectiveness and importance of vaccines to eliminate COVID-19. In addition, reliable information on vaccine safety, effectiveness, side effects and more is available on the following websites:  [place] Department of Health and Human Services COVID-19 Vaccine Information Website.  U.S. Centers for Disease Control and Prevention [id number] Human resources officer.  Staying Safe We agree with the CDC on what we can do to help our communities get back to normal: Getting "back to normal" is going to take all of our tools. If we use all the tools we have, we stand the best chance of getting our families, communities, schools and workplaces "back to normal" sooner:  Get vaccinated as soon as vaccines become available within the phase of the state's vaccination rollout plan for which you meet the eligibility criteria.  Wear a mask.  Stay 6 feet from others and avoid crowds.  Wash hands often.  For our most current information, please visit [email or web address].

## 2019-11-27 ENCOUNTER — Encounter: Payer: Self-pay | Admitting: Family Medicine

## 2019-11-27 LAB — HM DIABETES EYE EXAM

## 2020-03-25 ENCOUNTER — Encounter: Payer: Self-pay | Admitting: Family Medicine

## 2020-04-26 ENCOUNTER — Encounter: Payer: Self-pay | Admitting: Family Medicine

## 2020-04-26 ENCOUNTER — Ambulatory Visit: Payer: BC Managed Care – PPO | Admitting: Family Medicine

## 2020-04-26 ENCOUNTER — Telehealth: Payer: Self-pay | Admitting: Family Medicine

## 2020-04-26 ENCOUNTER — Other Ambulatory Visit: Payer: Self-pay

## 2020-04-26 VITALS — BP 132/80 | HR 61 | Temp 96.8°F | Ht 65.0 in | Wt 225.2 lb

## 2020-04-26 DIAGNOSIS — R519 Headache, unspecified: Secondary | ICD-10-CM | POA: Insufficient documentation

## 2020-04-26 MED ORDER — AMOXICILLIN-POT CLAVULANATE 875-125 MG PO TABS: 1.0000 | ORAL_TABLET | Freq: Two times a day (BID) | ORAL | 0 refills | Status: DC

## 2020-04-26 NOTE — Patient Instructions (Addendum)
Given lab results with normal CRP I doubt you have temporal arteritis (but prednisone may blunt labs a bit) Let's treat you for a possible sinus infection first with augmentin   After finishing antibiotics-call and let us know how symptoms are  Stay well hydrated with water  Avoid sugary drinks and sweets  Also avoid caffeine   If worse or any vision change please call

## 2020-04-26 NOTE — Telephone Encounter (Signed)
Patient came into office and filled out medical release form for [facility] to get lab results from [facility]. Please advise.

## 2020-04-26 NOTE — Telephone Encounter (Signed)
Jennifer Rose sent records request to [place]

## 2020-04-26 NOTE — Assessment & Plan Note (Addendum)
Noted by rheumatologist- R temple/ear area  Sinus related and temporal arteritis are in the differential  ESR was elevated at 47 (not surprising due to lupus), but CRP nl at 1 Sent for formal report  Pt takes plaquenil and also low dose prednisone No vision change Some maxillary sinus tenderness on R  Will tx empirically for sinusitis with augmentin while waiting for rheumatology report  If temporal arteritis is suspected-may need specialist eval

## 2020-04-26 NOTE — Progress Notes (Signed)
Subjective:    Patient ID: Jennifer Rose, female    DOB: Jul 06, 1956, 64 y.o.   MRN: [id number]  This visit occurred during the SARS-CoV-2 public health emergency.  Safety protocols were in place, including screening questions prior to the visit, additional usage of staff PPE, and extensive cleaning of exam room while observing appropriate contact time as indicated for disinfecting solutions.    HPI Pt presents with c/o R ear pain   Wt Readings from Last 3 Encounters:  04/26/20 225 lb 3.2 oz (102.2 kg)  11/12/19 212 lb 3 oz (96.2 kg)  09/24/19 215 lb 8 oz (97.8 kg)   37.48 kg/m  Saw rheumatologist recently    Dr Marijean Bravo  Noted she had pain over R ear and in temple (a few months)  occ gives her a headache  Some stuffiness occ/ had some R facial pressure  No vision change   Labs there Glucose 126 Cr 1.22 EFR 55 ESR 47 CRP 1  (nl range 0-10)  No change in hearing  No popping or ringing (L ear some tinnitus)   Had her eye exam in February  Glaucoma  Takes plaquenil  Lab Results  Component Value Date   HGBA1C 6.5 11/04/2019   Takes 10 mg of prednisone daily for lupus   Patient Active Problem List   Diagnosis Date Noted  . Headache 04/26/2020  . Colon cancer screening 11/12/2019  . H/O migraine 08/26/2018  . Routine general medical examination at a health care facility 08/26/2018  . Hypokalemia 08/26/2018  . Thrombocytopenia ([place]) 04/30/2017  . Prediabetes 12/13/2014  . Class 2 severe obesity due to excess calories with serious comorbidity and body mass index (BMI) of 35.0 to 35.9 in adult [facility]) 12/13/2014  . GLAUCOMA 04/15/2009  . LUPUS 11/09/2008  . Allergic rhinitis, cause unspecified 09/24/2007  . Essential hypertension 03/22/2004   Past Medical History:  Diagnosis Date  . Allergy    allergic rhinitis  . Hypertension 03/22/2004  . Lupus ([place])   . Uterine fibroid 11/01   per GYN   Past Surgical History:  Procedure Laterality Date  . BREAST CYST  EXCISION Right 36 yrs ago   x2  . [facility]    . ENDOMETRIAL ABLATION  04/03   polyps?DUB   Social History   Tobacco Use  . Smoking status: Never Smoker  . Smokeless tobacco: Never Used  Substance Use Topics  . Alcohol use: No    Alcohol/week: 0.0 standard drinks  . Drug use: No   History reviewed. No pertinent family history. No Known Allergies Current Outpatient Medications on File Prior to Visit  Medication Sig Dispense Refill  . amLODipine (NORVASC) 5 MG tablet Take 1 tablet (5 mg total) by mouth daily. 90 tablet 3  . aspirin 81 MG tablet Take 81 mg by mouth daily.      . cholecalciferol (VITAMIN D) 1000 UNITS tablet Take 2,000 Units by mouth daily.      . fexofenadine (ALLEGRA ALLERGY) 180 MG tablet Take 1 tablet (180 mg total) by mouth daily. (Patient taking differently: Take 180 mg by mouth daily as needed. ) 30 tablet 1  . fluticasone (FLONASE) 50 MCG/ACT nasal spray Place 1-2 sprays into both nostrils daily. (Patient taking differently: Place 1-2 sprays into both nostrils daily as needed. ) 16 g 5  . hydroxychloroquine (PLAQUENIL) 200 MG tablet Take 200 mg by mouth 2 (two) times daily.      [place] latanoprost (XALATAN) 0.005 % ophthalmic solution Place 1  drop into both eyes as directed.      [place] losartan-hydrochlorothiazide (HYZAAR) 50-12.5 MG tablet Take 1 tablet by mouth daily. 90 tablet 3  . meclizine (ANTIVERT) 12.5 MG tablet Take 12.5 mg by mouth 3 (three) times daily as needed.      . potassium chloride (KLOR-CON) 10 MEQ tablet Take 2 tablets (20 mEq total) by mouth daily. 180 tablet 3   No current facility-administered medications on file prior to visit.     Review of Systems  Constitutional: Positive for fatigue. Negative for activity change, appetite change, fever and unexpected weight change.  HENT: Negative for congestion, ear pain, rhinorrhea, sinus pressure and sore throat.   Eyes: Negative for photophobia, pain, discharge, redness, itching and visual disturbance.    Respiratory: Negative for cough, shortness of breath and wheezing.   Cardiovascular: Negative for chest pain and palpitations.  Gastrointestinal: Negative for abdominal pain, blood in stool, constipation and diarrhea.  Endocrine: Negative for polydipsia and polyuria.  Genitourinary: Negative for dysuria, frequency and urgency.  Musculoskeletal: Positive for arthralgias. Negative for back pain and myalgias.  Skin: Negative for pallor and rash.  Allergic/Immunologic: Negative for environmental allergies.  Neurological: Positive for headaches. Negative for dizziness, syncope, light-headedness and numbness.  Hematological: Negative for adenopathy. Does not bruise/bleed easily.  Psychiatric/Behavioral: Negative for decreased concentration and dysphoric mood. The patient is not nervous/anxious.        Objective:   Physical Exam Constitutional:      General: She is not in acute distress.    Appearance: Normal appearance. She is well-developed. She is obese. She is not ill-appearing or diaphoretic.  HENT:     Head: Normocephalic and atraumatic.     Comments: No temporal tenderness  Mild tenderness over R ear w/o swelling or skin change    Right Ear: Tympanic membrane, ear canal and external ear normal.     Left Ear: Tympanic membrane, ear canal and external ear normal.     Nose:     Comments: Nares are boggy Some pnd    Mouth/Throat:     Pharynx: Oropharynx is clear. No posterior oropharyngeal erythema.  Eyes:     General: No scleral icterus.       Right eye: No discharge.        Left eye: No discharge.     Conjunctiva/sclera: Conjunctivae normal.     Pupils: Pupils are equal, round, and reactive to light.  Neck:     Thyroid: No thyromegaly.     Vascular: No carotid bruit or JVD.  Cardiovascular:     Rate and Rhythm: Normal rate and regular rhythm.     Heart sounds: Normal heart sounds. No gallop.   Pulmonary:     Effort: Pulmonary effort is normal. No respiratory distress.      Breath sounds: Normal breath sounds. No wheezing or rales.  Abdominal:     General: Bowel sounds are normal. There is no distension or abdominal bruit.     Palpations: Abdomen is soft. There is no mass.     Tenderness: There is no abdominal tenderness.  Musculoskeletal:     Cervical back: Normal range of motion and neck supple. No rigidity or tenderness.  Lymphadenopathy:     Cervical: No cervical adenopathy.  Skin:    General: Skin is warm and dry.     Coloration: Skin is not pale.     Findings: No erythema or rash.  Neurological:     Mental Status: She is alert. Mental status  is at baseline.     Cranial Nerves: No cranial nerve deficit.     Sensory: No sensory deficit.     Motor: No weakness.     Coordination: Coordination normal.     Gait: Gait normal.     Deep Tendon Reflexes: Reflexes are normal and symmetric. Reflexes normal.  Psychiatric:        Mood and Affect: Mood normal.           Assessment & Plan:   Problem List Items Addressed This Visit      Other   Headache - Primary    Noted by rheumatologist- R temple/ear area  Sinus related and temporal arteritis are in the differential  ESR was elevated at 47 (not surprising due to lupus), but CRP nl at 1 Sent for formal report  Pt takes plaquenil and also low dose prednisone No vision change Some maxillary sinus tenderness on R  Will tx empirically for sinusitis with augmentin while waiting for rheumatology report  If temporal arteritis is suspected-may need specialist eval

## 2020-05-26 ENCOUNTER — Encounter: Payer: Self-pay | Admitting: Family Medicine

## 2020-06-15 ENCOUNTER — Telehealth: Payer: Self-pay

## 2020-06-15 DIAGNOSIS — R519 Headache, unspecified: Secondary | ICD-10-CM

## 2020-06-15 DIAGNOSIS — H9201 Otalgia, right ear: Secondary | ICD-10-CM | POA: Insufficient documentation

## 2020-06-15 NOTE — Telephone Encounter (Signed)
Pt requesting referral to ENT specialist as discussed at 04/26/20 OV. Pt said the rt ear pain is the same as when seen on 04/26/20; still nagging achy pain in rt ear that goes up rt side of head. Pain is worse at night with pain level of 8 at nighttime. Pt is not having any vision changes,jaw pain,fever or fatigue. Pt does have occasional lightheadedness on and off but none now.  Since 04/26/20 pt has seen dentist who saw no dental issues and eye doctor who saw no problems with eyes. Pt request cb with referral info.

## 2020-06-15 NOTE — Telephone Encounter (Signed)
Referral done Will route to [facility]

## 2020-06-16 NOTE — Telephone Encounter (Signed)
noted 

## 2020-06-30 ENCOUNTER — Ambulatory Visit (INDEPENDENT_AMBULATORY_CARE_PROVIDER_SITE_OTHER): Payer: BC Managed Care – PPO | Admitting: Otolaryngology

## 2020-06-30 ENCOUNTER — Encounter (INDEPENDENT_AMBULATORY_CARE_PROVIDER_SITE_OTHER): Payer: Self-pay | Admitting: Otolaryngology

## 2020-06-30 ENCOUNTER — Other Ambulatory Visit: Payer: Self-pay

## 2020-06-30 VITALS — Temp 97.2°F

## 2020-06-30 DIAGNOSIS — H6123 Impacted cerumen, bilateral: Secondary | ICD-10-CM

## 2020-06-30 DIAGNOSIS — H9201 Otalgia, right ear: Secondary | ICD-10-CM

## 2020-06-30 NOTE — Progress Notes (Signed)
HPI: Jennifer Rose is a 64 y.o. female who presents is referred by Dr. Glori Bickers for evaluation of right ear pain that she has had for couple months.  She has history of lupus and has seen a rheumatologist.  She apparently had negative studies for vascular disease from lupus.  She has also seen her dentist and had normal evaluation with him.  She is also seeing ophthalmologist with a normal evaluation. She initially describes the pain is beginning over or above her right ear and that sometimes it radiated up to the top of the scalp.  She is also had pain in front of the ear in the region of the TMJ joint although she has no pain or discomfort when she eats or chews.  She uses Flonase on a as needed basis has been treated with 1 round of antibiotics for sinus infection although she has had no yellow-green discharge from her nose.  She generally takes Advil for the ear pain and sometimes uses warm compresses which seemed to help.  She has not noted any change in her hearing.  Past Medical History:  Diagnosis Date  . Allergy    allergic rhinitis  . Hypertension 03/22/2004  . Lupus ([place])   . Uterine fibroid 11/01   per GYN   Past Surgical History:  Procedure Laterality Date  . BREAST CYST EXCISION Right 36 yrs ago   x2  . [facility]cy    . ENDOMETRIAL ABLATION  04/03   polyps?DUB   Social History   Socioeconomic History  . Marital status: Divorced    Spouse name: Not on file  . Number of children: Not on file  . Years of education: Not on file  . Highest education level: Not on file  Occupational History  . Not on file  Tobacco Use  . Smoking status: Never Smoker  . Smokeless tobacco: Never Used  Substance and Sexual Activity  . Alcohol use: No    Alcohol/week: 0.0 standard drinks  . Drug use: No  . Sexual activity: Not on file  Other Topics Concern  . Not on file  Social History Narrative  . Not on file   Social Determinants of Health   Financial Resource Strain:   . Difficulty of  Paying Living Expenses: Not on file  Food Insecurity:   . Worried About Charity fundraiser in the Last Year: Not on file  . Ran Out of Food in the Last Year: Not on file  Transportation Needs:   . Lack of Transportation (Medical): Not on file  . Lack of Transportation (Non-Medical): Not on file  Physical Activity:   . Days of Exercise per Week: Not on file  . Minutes of Exercise per Session: Not on file  Stress:   . Feeling of Stress : Not on file  Social Connections:   . Frequency of Communication with Friends and Family: Not on file  . Frequency of Social Gatherings with Friends and Family: Not on file  . Attends Religious Services: Not on file  . Active Member of Clubs or Organizations: Not on file  . Attends Archivist Meetings: Not on file  . Marital Status: Not on file   No family history on file. No Known Allergies Prior to Admission medications   Medication Sig Start Date End Date Taking? Authorizing Provider  amLODipine (NORVASC) 5 MG tablet Take 1 tablet (5 mg total) by mouth daily. 11/12/19  Yes Tower, Wynelle Fanny, MD  aspirin 81 MG tablet  Take 81 mg by mouth daily.     Yes [provider]  cholecalciferol (VITAMIN D) 1000 UNITS tablet Take 2,000 Units by mouth daily.     Yes [provider]  fexofenadine (ALLEGRA ALLERGY) 180 MG tablet Take 1 tablet (180 mg total) by mouth daily. Patient taking differently: Take 180 mg by mouth daily as needed.  02/12/17  Yes Tonia Ghent, MD  fluticasone [facility]) 50 MCG/ACT nasal spray Place 1-2 sprays into both nostrils daily. Patient taking differently: Place 1-2 sprays into both nostrils daily as needed.  06/05/18  Yes Tower, Wynelle Fanny, MD  hydroxychloroquine (PLAQUENIL) 200 MG tablet Take 200 mg by mouth 2 (two) times daily.     Yes [provider]  latanoprost (XALATAN) 0.005 % ophthalmic solution Place 1 drop into both eyes as directed.     Yes [provider]  losartan-hydrochlorothiazide  (HYZAAR) 50-12.5 MG tablet Take 1 tablet by mouth daily. 11/12/19  Yes Tower, Wynelle Fanny, MD  meclizine (ANTIVERT) 12.5 MG tablet Take 12.5 mg by mouth 3 (three) times daily as needed.     Yes [provider]  potassium chloride (KLOR-CON) 10 MEQ tablet Take 2 tablets (20 mEq total) by mouth daily. 11/12/19  Yes Tower, Wynelle Fanny, MD  amoxicillin-clavulanate (AUGMENTIN) 875-125 MG tablet Take 1 tablet by mouth 2 (two) times daily. Patient not taking: Reported on 06/30/2020 04/26/20   Tower, Wynelle Fanny, MD     Positive ROS: Otherwise negative  All other systems have been reviewed and were otherwise negative with the exception of those mentioned in the HPI and as above.  Physical Exam: Constitutional: Alert, well-appearing, no acute distress Ears: External ears without lesions or tenderness.  She had a little bit of wax buildup in both ear canals left side actually worse on the right side.  This was removed with forceps.  Otherwise the TMs are clear bilaterally with good mobility on pneumatic otoscopy.  No middle ear effusion noted.  No inflammatory changes of the ear canal.  Hearing screening with the 512 1024 tuning fork revealed symmetric hearing with AC > BC bilaterally. Nasal: External nose without lesions. Septum midline with mild rhinitis.  Both middle meatus regions are clear.. Clear nasal passages with no signs of infection. Fiberoptic laryngoscopy was performed through the right nostril.  The middle meatus region was clear.  The nasopharynx was clear.  The eustachian tube region was unobstructed.  The base of tongue vallecula and epiglottis were normal.  Hypopharynx and larynx were normal. Oral: Lips and gums without lesions. Tongue and palate mucosa without lesions. Posterior oropharynx clear.  Tonsils are small and benign in appearance bilaterally.  Indirect laryngoscopy revealed a clear base of tongue. Neck: No palpable adenopathy or masses.  There is no swelling around the parotid region and  no pain on palpation of the right TMJ region.  No scalp abnormalities noted. Respiratory: Breathing comfortably  Skin: No facial/neck lesions or rash noted.  Cerumen impaction removal  Date/Time: 06/30/2020 3:25 PM Performed by: Rozetta Nunnery, MD Authorized by: Rozetta Nunnery, MD   Consent:    Consent obtained:  Verbal   Consent given by:  Patient   Risks discussed:  Pain and bleeding Procedure details:    Location:  L ear and R ear   Procedure type: curette and forceps   Post-procedure details:    Inspection:  TM intact and canal normal   Hearing quality:  Improved   Patient tolerance of procedure:  Tolerated  well, no immediate complications Comments:     Patient with minimal wax buildup in both ear canals that was cleaned with forceps.  The TMs were otherwise clear.    Assessment: Right otalgia questionable etiology.  Possible neuropathy.  Normal ear, sinus exam, nasopharyngeal exam and upper airway exam.  Plan: Reassured patient of normal examination.  Suggested use of NSAIDs for the pain that she is presently doing as well as warm compresses.  If the pain worsens possibly consider further evaluation with neurology.   Radene Journey, MD   CC:

## 2020-08-01 ENCOUNTER — Other Ambulatory Visit: Payer: Self-pay | Admitting: Obstetrics and Gynecology

## 2020-08-01 DIAGNOSIS — Z1231 Encounter for screening mammogram for malignant neoplasm of breast: Secondary | ICD-10-CM

## 2020-08-03 ENCOUNTER — Ambulatory Visit (INDEPENDENT_AMBULATORY_CARE_PROVIDER_SITE_OTHER): Payer: BC Managed Care – PPO

## 2020-08-03 ENCOUNTER — Other Ambulatory Visit: Payer: Self-pay

## 2020-08-03 DIAGNOSIS — Z23 Encounter for immunization: Secondary | ICD-10-CM | POA: Diagnosis not present

## 2020-08-25 ENCOUNTER — Other Ambulatory Visit: Payer: Self-pay

## 2020-08-25 ENCOUNTER — Ambulatory Visit
Admission: RE | Admit: 2020-08-25 | Discharge: 2020-08-25 | Disposition: A | Discharge status: Home / Self Care | Payer: BC Managed Care – PPO | Source: Ambulatory Visit | Attending: Obstetrics and Gynecology | Admitting: Obstetrics and Gynecology

## 2020-08-25 DIAGNOSIS — Z1231 Encounter for screening mammogram for malignant neoplasm of breast: Secondary | ICD-10-CM

## 2020-10-24 ENCOUNTER — Other Ambulatory Visit: Payer: Self-pay

## 2020-10-24 DIAGNOSIS — Z20822 Contact with and (suspected) exposure to covid-19: Secondary | ICD-10-CM

## 2020-10-26 LAB — SARS-COV-2, NAA 2 DAY TAT

## 2020-10-26 LAB — NOVEL CORONAVIRUS, NAA: SARS-CoV-2, NAA: NOT DETECTED

## 2020-10-27 ENCOUNTER — Other Ambulatory Visit: Payer: Self-pay

## 2020-10-27 ENCOUNTER — Telehealth (INDEPENDENT_AMBULATORY_CARE_PROVIDER_SITE_OTHER): Payer: Self-pay | Admitting: Family Medicine

## 2020-10-27 DIAGNOSIS — R0981 Nasal congestion: Secondary | ICD-10-CM

## 2020-10-27 MED ORDER — AMOXICILLIN-POT CLAVULANATE 875-125 MG PO TABS: 1.0000 | ORAL_TABLET | Freq: Two times a day (BID) | ORAL | 0 refills | Status: DC

## 2020-10-27 NOTE — Progress Notes (Signed)
Virtual Visit via Telephone Note  I connected with Jennifer Rose on 10/27/20 at  5:20 PM EST by telephone and verified that I am speaking with the correct person using two identifiers.   I discussed the limitations, risks, security and privacy concerns of performing an evaluation and management service by telephone and the availability of in person appointments. I also discussed with the patient that there may be a patient responsible charge related to this service. The patient expressed understanding and agreed to proceed.  Location patient: home, [place] Location provider: work or home office Participants present for the call: patient, provider Patient did not have a visit with me in the prior 7 days to address this/these issue(s).   History of Present Illness:  Acute telemedicine visit for sinus congestion: -Onset: 1 week ago, but some even before with weather changes -covid test was negative -Symptoms include: now worse with nasal congestion, pnd, cough, now sinus discomfort with yellowish off color to it -Denies:fevers,malaise, cp, SOB, malaise, loss of taste or smell, NVD -Has tried: allergy pill and nasal saline -Pertinent past medical history:lupus, hx of sinus infection -Pertinent medication allergies: nkda -COVID-19 vaccine status:fully vaccinated and boosted   Observations/Objective: Patient sounds cheerful and well on the phone. I do not appreciate any SOB. Speech and thought processing are grossly intact. Patient reported vitals:  Assessment and Plan:  Sinus congestion  -we discussed possible serious and likely etiologies, options for evaluation and workup, limitations of telemedicine visit vs in person visit, treatment, treatment risks and precautions. Pt prefers to treat via telemedicine empirically rather than in person at this moment. Query viral respiratory illness, versus developing sinusitis versus other. She reports her primary care doctor always gives her  Augmentin for this and she is requesting a prescription. Discussed nasal saline, short course of nasal decongestant if needed contraindications and sent Augmentin 875 to take twice daily for 10 days if symptoms are worsening or not improving. Scheduled follow up with PCP offered: Agrees to follow-up if needed Advised to seek prompt in person care if worsening, new symptoms arise, or if is not improving with treatment. Advised of options for inperson care in case PCP office not available. Did let the patient know that I only do telemedicine shifts for [place] on Tuesdays and Thursdays and advised a follow up visit with PCP or at an [facility] if has further questions or concerns.   Follow Up Instructions:  I did not refer this patient for an OV with me in the next 24 hours for this/these issue(s).  I discussed the assessment and treatment plan with the patient. The patient was provided an opportunity to ask questions and all were answered. The patient agreed with the plan and demonstrated an understanding of the instructions.   I spent  12 minutes on the date of this visit in the care of this patient. See summary of tasks completed to properly care for this patient in the detailed notes above which included review of PMH, medications, allergies, evaluation of the patient and ordering and instructing patient on testing and care options.     Terressa Koyanagi, DO

## 2020-10-27 NOTE — Patient Instructions (Signed)
-  I sent the medication(s) we discussed to your pharmacy: Meds ordered this encounter  Medications  . amoxicillin-clavulanate (AUGMENTIN) 875-125 MG tablet    Sig: Take 1 tablet by mouth 2 (two) times daily.    Dispense:  20 tablet    Refill:  0     I hope you are feeling better soon!  Seek in person care promptly if your symptoms worsen, new concerns arise or you are not improving with treatment.  It was nice to meet you today. I help Jennifer Rose out with telemedicine visits on Tuesdays and Thursdays and am available for visits on those days. If you have any concerns or questions following this visit please schedule a follow up visit with your Primary Care doctor or seek care at a local urgent care clinic to avoid delays in care.   

## 2020-11-01 ENCOUNTER — Telehealth: Payer: Self-pay | Admitting: Family Medicine

## 2020-11-06 ENCOUNTER — Telehealth: Payer: Self-pay | Admitting: Family Medicine

## 2020-11-06 DIAGNOSIS — D696 Thrombocytopenia, unspecified: Secondary | ICD-10-CM

## 2020-11-06 DIAGNOSIS — R7303 Prediabetes: Secondary | ICD-10-CM

## 2020-11-06 DIAGNOSIS — I10 Essential (primary) hypertension: Secondary | ICD-10-CM

## 2020-11-06 NOTE — Telephone Encounter (Signed)
-----   Message from Cloyd Stagers, RT sent at 10/24/2020  1:37 PM EST ----- Regarding: Lab Orders for Tuesday 1.18.2022 Please place lab orders for Tuesday 1.18.2022, office visit for physical on Tuesday 1.25.2022 Thank you, Dyke Maes RT(R)

## 2020-11-08 ENCOUNTER — Other Ambulatory Visit: Payer: BC Managed Care – PPO

## 2020-11-09 ENCOUNTER — Other Ambulatory Visit: Payer: Self-pay

## 2020-11-09 ENCOUNTER — Other Ambulatory Visit (INDEPENDENT_AMBULATORY_CARE_PROVIDER_SITE_OTHER): Payer: BC Managed Care – PPO

## 2020-11-09 DIAGNOSIS — I10 Essential (primary) hypertension: Secondary | ICD-10-CM | POA: Diagnosis not present

## 2020-11-09 DIAGNOSIS — R7303 Prediabetes: Secondary | ICD-10-CM

## 2020-11-09 DIAGNOSIS — D696 Thrombocytopenia, unspecified: Secondary | ICD-10-CM | POA: Diagnosis not present

## 2020-11-09 LAB — COMPREHENSIVE METABOLIC PANEL
ALT: 22 U/L (ref 0–35)
AST: 27 U/L (ref 0–37)
Albumin: 3.9 g/dL (ref 3.5–5.2)
Alkaline Phosphatase: 74 U/L (ref 39–117)
BUN: 18 mg/dL (ref 6–23)
CO2: 27 mEq/L (ref 19–32)
Calcium: 9.2 mg/dL (ref 8.4–10.5)
Chloride: 106 mEq/L (ref 96–112)
Creatinine, Ser: 1.08 mg/dL (ref 0.40–1.20)
GFR: 55 mL/min — ABNORMAL LOW (ref >60.00)
Glucose, Bld: 135 mg/dL — ABNORMAL HIGH (ref 70–99)
Potassium: 3.6 mEq/L (ref 3.5–5.1)
Sodium: 139 mEq/L (ref 135–145)
Total Bilirubin: 0.4 mg/dL (ref 0.2–1.2)
Total Protein: 6.9 g/dL (ref 6.0–8.3)

## 2020-11-09 LAB — CBC WITH DIFFERENTIAL/PLATELET
Basophils Absolute: 0 10*3/uL (ref 0.0–0.1)
Basophils Relative: 0.5 % (ref 0.0–3.0)
Eosinophils Absolute: 0.1 10*3/uL (ref 0.0–0.7)
Eosinophils Relative: 1.5 % (ref 0.0–5.0)
HCT: 37.6 % (ref 36.0–46.0)
Hemoglobin: 12.5 g/dL (ref 12.0–15.0)
Lymphocytes Relative: 17.2 % (ref 12.0–46.0)
Lymphs Abs: 0.8 10*3/uL (ref 0.7–4.0)
MCHC: 33.2 g/dL (ref 30.0–36.0)
MCV: 80.8 fl (ref 78.0–100.0)
Monocytes Absolute: 0.5 10*3/uL (ref 0.1–1.0)
Monocytes Relative: 11.3 % (ref 3.0–12.0)
Neutro Abs: 3.2 10*3/uL (ref 1.4–7.7)
Neutrophils Relative %: 69.5 % (ref 43.0–77.0)
Platelets: 109 10*3/uL — ABNORMAL LOW (ref 150.0–400.0)
RBC: 4.65 Mil/uL (ref 3.87–5.11)
RDW: 13.5 % (ref 11.5–15.5)
WBC: 4.6 10*3/uL (ref 4.0–10.5)

## 2020-11-09 LAB — LIPID PANEL
Cholesterol: 150 mg/dL (ref 0–200)
HDL: 42.7 mg/dL (ref >39.00)
LDL Cholesterol: 86 mg/dL (ref 0–99)
NonHDL: 107.25
Total CHOL/HDL Ratio: 4
Triglycerides: 107 mg/dL (ref 0.0–149.0)
VLDL: 21.4 mg/dL (ref 0.0–40.0)

## 2020-11-09 LAB — HEMOGLOBIN A1C: Hgb A1c MFr Bld: 6.7 % — ABNORMAL HIGH (ref 4.6–6.5)

## 2020-11-09 LAB — TSH: TSH: 3.42 u[IU]/mL (ref 0.35–4.50)

## 2020-11-15 ENCOUNTER — Encounter: Payer: Self-pay | Admitting: Family Medicine

## 2020-11-15 ENCOUNTER — Other Ambulatory Visit: Payer: Self-pay

## 2020-11-15 ENCOUNTER — Ambulatory Visit (INDEPENDENT_AMBULATORY_CARE_PROVIDER_SITE_OTHER): Payer: BC Managed Care – PPO | Admitting: Family Medicine

## 2020-11-15 VITALS — BP 118/68 | HR 81 | Temp 96.9°F | Ht 65.0 in | Wt 224.1 lb

## 2020-11-15 DIAGNOSIS — M3219 Other organ or system involvement in systemic lupus erythematosus: Secondary | ICD-10-CM

## 2020-11-15 DIAGNOSIS — Z0000 Encounter for general adult medical examination without abnormal findings: Secondary | ICD-10-CM

## 2020-11-15 DIAGNOSIS — R7303 Prediabetes: Secondary | ICD-10-CM

## 2020-11-15 DIAGNOSIS — D696 Thrombocytopenia, unspecified: Secondary | ICD-10-CM | POA: Diagnosis not present

## 2020-11-15 DIAGNOSIS — Z23 Encounter for immunization: Secondary | ICD-10-CM

## 2020-11-15 DIAGNOSIS — I10 Essential (primary) hypertension: Secondary | ICD-10-CM | POA: Diagnosis not present

## 2020-11-15 DIAGNOSIS — H9201 Otalgia, right ear: Secondary | ICD-10-CM

## 2020-11-15 DIAGNOSIS — Z6837 Body mass index (BMI) 37.0-37.9, adult: Secondary | ICD-10-CM

## 2020-11-15 MED ORDER — POTASSIUM CHLORIDE ER 10 MEQ PO TBCR
20.0000 meq | EXTENDED_RELEASE_TABLET | Freq: Every day | ORAL | 3 refills | Status: AC
Start: 2020-11-15 — End: ?

## 2020-11-15 MED ORDER — AMLODIPINE BESYLATE 5 MG PO TABS: 5.0000 mg | ORAL_TABLET | Freq: Every day | ORAL | 3 refills | Status: DC

## 2020-11-15 MED ORDER — FLUTICASONE PROPIONATE 50 MCG/ACT NA SUSP: 1.0000 | Freq: Every day | NASAL | 5 refills | Status: DC

## 2020-11-15 MED ORDER — LOSARTAN POTASSIUM-HCTZ 50-12.5 MG PO TABS: 1.0000 | ORAL_TABLET | Freq: Every day | ORAL | 3 refills | Status: DC

## 2020-11-15 NOTE — Assessment & Plan Note (Signed)
bp in fair control at this time  BP Readings from Last 1 Encounters:  11/15/20 118/68   No changes needed Most recent labs reviewed  Disc lifstyle change with low sodium diet and exercise  Plan to continue  Amlodipine 5 mg daily  Losartan hct 50-12.5 daily

## 2020-11-15 NOTE — Assessment & Plan Note (Signed)
Recommend inc flonse to bid for a week  Then daily  Has seen ENT/dentist

## 2020-11-15 NOTE — Assessment & Plan Note (Signed)
Reviewed health habits including diet and exercise and skin cancer prevention Reviewed appropriate screening tests for age  Also reviewed health mt list, fam hx and immunization status , as well as social and family history   See HPI Labs reviewed  utd cancer screening  Gyn visit later today  covid immunized (no dates -she will call)  Tdap today  Not ready for shingrix yet

## 2020-11-15 NOTE — Patient Instructions (Addendum)
If you are interested in the shingles vaccine series (Shingrix), call your insurance or pharmacy to check on coverage and location it must be given.  If affordable - you can schedule it here or at your pharmacy depending on coverage   Tdap vaccine today   For blood sugar Try to get most of your carbohydrates from produce (with the exception of white potatoes)  Eat less bread/pasta/rice/snack foods/cereals/sweets and other items from the middle of the grocery store (processed carbs)  Follow up in 3 months

## 2020-11-15 NOTE — Assessment & Plan Note (Signed)
Lab Results  Component Value Date   HGBA1C 6.7 (H) 11/09/2020   Both parents and sibs have DM  Will work on wt loss and low glycemic diet  Disc poss of metformin/DM teaching/supplies F/u 3 mo

## 2020-11-15 NOTE — Assessment & Plan Note (Signed)
Stable on plaquenil

## 2020-11-15 NOTE — Assessment & Plan Note (Signed)
Now with inc a1c Discussed how this problem influences overall health and the risks it imposes  Reviewed plan for weight loss with lower calorie diet (via better food choices and also portion control or program like weight watchers) and exercise building up to or more than 30 minutes 5 days per week including some aerobic activity   F/u 3 mo  Consider healthy wt and wellness clinic

## 2020-11-15 NOTE — Addendum Note (Signed)
Addended by: Tora Kindred on: 11/15/2020 09:59 AM   Modules accepted: Orders

## 2020-11-15 NOTE — Assessment & Plan Note (Signed)
Platelet ct is 109 Has SLE No symptoms  Continue to watch

## 2020-11-15 NOTE — Progress Notes (Signed)
Subjective:    Patient ID: Jennifer Rose, female    DOB: 12-21-1955, 65 y.o.   MRN: [id number]  This visit occurred during the SARS-CoV-2 public health emergency.  Safety protocols were in place, including screening questions prior to the visit, additional usage of staff PPE, and extensive cleaning of exam room while observing appropriate contact time as indicated for disinfecting solutions.    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   covid status -vaccinated/ will get [place] dates  Had vaccines and stays masked   Tetanus shot -needs Tdap (around babies in the future)  Flu shot 10/21 Zoster status - interested in vaccine in the future   Mammogram 11/21 Self breast exam -no lumps   Pap 12/19 -negative One is planned with gyn tomorrow    Colon cancer screening -colonoscopy 6/21    HTN bp is stable today  No cp or palpitations or headaches or edema  No side effects to medicines  BP Readings from Last 3 Encounters:  11/15/20 118/68  04/26/20 132/80  11/12/19 120/75     Amlodipine 5 mg daily  Losartan hct 50-12.5 mg  klor con   Pulse Readings from Last 3 Encounters:  11/15/20 81  04/26/20 61  11/12/19 78    SLE- plaquenil Ach[place] more with recent weather changes but otherwise stable    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 Supplement) to help blood sugar, taking it about 3 weeks    Cholesterol Lab Results  Component Value Date   CHOL 150 11/09/2020   CHOL 160 11/04/2019   CHOL 135 08/19/2018   Lab Results  Component Value Date   HDL 42.70 11/09/2020   HDL 39.30 11/04/2019   HDL 34.80 (L) 08/19/2018   Lab Results  Component Value Date   LDLCALC 86 11/09/2020   LDLCALC 105 (H) 11/04/2019   LDLCALC 83 08/19/2018   Lab Results  Component Value Date   TRIG 107.0 11/09/2020   TRIG 81.0 11/04/2019   TRIG 84.0 08/19/2018   Lab Results  Component Value Date   CHOLHDL 4 11/09/2020   CHOLHDL 4 11/04/2019   CHOLHDL 4 08/19/2018   No results found for: LDLDIRECT    Lab Results  Component Value Date   CREATININE 1.08 11/09/2020   BUN 18 11/09/2020   NA 139 11/09/2020   K 3.6 11/09/2020   CL 106 11/09/2020   CO2 27 11/09/2020   Lab Results  Component Value Date   ALT 22 11/09/2020   AST 27 11/09/2020   ALKPHOS 74 11/09/2020   BILITOT 0.4 11/09/2020   Lab Results  Component Value Date   TSH 3.42 11/09/2020    Patient  Active Problem List   Diagnosis Date Noted  . Right ear pain 06/15/2020  . Headache 04/26/2020  . Colon cancer screening 11/12/2019  . H/O migraine 08/26/2018  . Routine general medical examination at a health care facility 08/26/2018  . Hypokalemia 08/26/2018  . Thrombocytopenia ([place]) 04/30/2017  . Prediabetes 12/13/2014  . BMI 37.0-37.9, adult 12/13/2014  . GLAUCOMA 04/15/2009  . LUPUS 11/09/2008  . Allergic rhinitis, cause unspecified 09/24/2007  . Essential hypertension 03/22/2004   Past Medical History:  Diagnosis Date  . Allergy    allergic rhinitis  . Hypertension 03/22/2004  . Lupus ([place])   . Uterine fibroid 11/01   per GYN   Past Surgical History:  Procedure Laterality Date  . BREAST CYST EXCISION Right 36 yrs ago   x2  . ccy    . ENDOMETRIAL ABLATION  04/03   polyps?DUB   Social History   Tobacco Use  .  Smoking status: Never Smoker  . Smokeless tobacco: Never Used  Substance Use Topics  . Alcohol use: No    Alcohol/week: 0.0 standard drinks  . Drug use: No   No family history on file. No Known Allergies Current Outpatient Medications on File Prior to Visit  Medication Sig Dispense Refill  . cholecalciferol (VITAMIN D) 1000 UNITS tablet Take 2,000 Units by mouth daily.    . fexofenadine (ALLEGRA ALLERGY) 180 MG tablet Take 1 tablet (180 mg total) by mouth daily. (Patient taking differently: Take 180 mg by mouth daily as needed.) 30 tablet 1  . hydroxychloroquine (PLAQUENIL) 200 MG tablet Take 200 mg by mouth 2 (two) times daily.    Marland Kitchen latanoprost (XALATAN) 0.005 % ophthalmic solution Place 1 drop into both eyes as directed.    . meclizine (ANTIVERT) 12.5 MG tablet Take 12.5 mg by mouth 3 (three) times daily as needed.     No current facility-administered medications on file prior to visit.    Review of Systems  Constitutional: Negative for activity change, appetite change, fatigue, fever and unexpected weight change.  HENT: Negative for congestion, ear pain, rhinorrhea, sinus pressure and sore throat.        Right ear fullness  Eyes: Negative for pain, redness and visual disturbance.  Respiratory: Negative for cough, shortness of breath and wheezing.   Cardiovascular: Negative for chest pain and palpitations.  Gastrointestinal: Negative for abdominal pain, blood in stool, constipation and diarrhea.  Endocrine: Negative for polydipsia and polyuria.  Genitourinary: Negative for dysuria, frequency and urgency.  Musculoskeletal: Negative for arthralgias, back pain and myalgias.  Skin: Negative for pallor and rash.  Allergic/Immunologic: Negative for environmental allergies.  Neurological: Negative for dizziness, syncope and headaches.  Hematological: Negative for adenopathy. Does not bruise/bleed easily.  Psychiatric/Behavioral: Negative for decreased concentration and dysphoric mood.  The patient is not nervous/anxious.        Objective:   Physical Exam Constitutional:      General: She is not in acute distress.    Appearance: Normal appearance. She is well-developed. She is obese. She is not ill-appearing or diaphoretic.  HENT:     Head: Normocephalic and atraumatic.     Right Ear: Tympanic membrane, ear canal and external ear normal.     Left Ear: Tympanic membrane, ear canal and external ear normal.     Nose: Nose normal. No congestion.     Mouth/Throat:     Mouth: Mucous membranes are moist.     Pharynx: Oropharynx is clear. No posterior oropharyngeal erythema.  Eyes:  General: No scleral icterus.    Extraocular Movements: Extraocular movements intact.     Conjunctiva/sclera: Conjunctivae normal.     Pupils: Pupils are equal, round, and reactive to light.  Neck:     Thyroid: No thyromegaly.     Vascular: No carotid bruit or JVD.  Cardiovascular:     Rate and Rhythm: Normal rate and regular rhythm.     Pulses: Normal pulses.     Heart sounds: Normal heart sounds. No gallop.   Pulmonary:     Effort: Pulmonary effort is normal. No respiratory distress.     Breath sounds: Normal breath sounds. No wheezing.     Comments: Good air exch Chest:     Chest wall: No tenderness.  Abdominal:     General: Bowel sounds are normal. There is no distension or abdominal bruit.     Palpations: Abdomen is soft. There is no mass.     Tenderness: There is no abdominal tenderness.     Hernia: No hernia is present.  Genitourinary:    Comments: Breast and pelvic exam done by gyn provider   Musculoskeletal:        General: No tenderness. Normal range of motion.     Cervical back: Normal range of motion and neck supple. No rigidity. No muscular tenderness.     Right lower leg: No edema.     Left lower leg: No edema.  Lymphadenopathy:     Cervical: No cervical adenopathy.  Skin:    General: Skin is warm and dry.     Coloration: Skin is not pale.     Findings: No  erythema or rash.  Neurological:     Mental Status: She is alert. Mental status is at baseline.     Cranial Nerves: No cranial nerve deficit.     Motor: No abnormal muscle tone.     Coordination: Coordination normal.     Gait: Gait normal.     Deep Tendon Reflexes: Reflexes are normal and symmetric. Reflexes normal.  Psychiatric:        Mood and Affect: Mood normal.        Cognition and Memory: Cognition and memory normal.           Assessment & Plan:   Problem List Items Addressed This Visit      Cardiovascular and Mediastinum   Essential hypertension    bp in fair control at this time  BP Readings from Last 1 Encounters:  11/15/20 118/68   No changes needed Most recent labs reviewed  Disc lifstyle change with low sodium diet and exercise  Plan to continue  Amlodipine 5 mg daily  Losartan hct 50-12.5 daily      Relevant Medications   amLODipine (NORVASC) 5 MG tablet   losartan-hydrochlorothiazide (HYZAAR) 50-12.5 MG tablet     Other   LUPUS    Stable on plaquenil      Prediabetes    Lab Results  Component Value Date   HGBA1C 6.7 (H) 11/09/2020   Both parents and sibs have DM  Will work on wt loss and low glycemic diet  Disc poss of metformin/DM teaching/supplies F/u 3 mo      BMI 37.0-37.9, adult    Now with inc a1c Discussed how this problem influences overall health and the risks it imposes  Reviewed plan for weight loss with lower calorie diet (via better food choices and also portion control or program like weight watchers) and exercise building up to or more than  30 minutes 5 days per week including some aerobic activity   F/u 3 mo  Consider healthy wt and wellness clinic       Thrombocytopenia ([place])    Platelet ct is 109 Has SLE No symptoms  Continue to watch      Routine general medical examination at a health care facility - Primary    Reviewed health habits including diet and exercise and skin cancer prevention Reviewed appropriate  screening tests for age  Also reviewed health mt list, fam hx and immunization status , as well as social and family history   See HPI Labs reviewed  utd cancer screening  Gyn visit later today  covid immunized (no dates -she will call)  Tdap today  Not ready for shingrix yet       Right ear pain    Recommend inc flonse to bid for a week  Then daily  Has seen ENT/dentist

## 2020-11-28 ENCOUNTER — Encounter: Payer: Self-pay | Admitting: Family Medicine

## 2021-02-15 ENCOUNTER — Ambulatory Visit: Payer: BC Managed Care – PPO | Admitting: Family Medicine

## 2021-02-15 ENCOUNTER — Encounter: Payer: Self-pay | Admitting: Family Medicine

## 2021-02-15 ENCOUNTER — Other Ambulatory Visit: Payer: Self-pay

## 2021-02-15 VITALS — BP 124/72 | HR 80 | Temp 97.0°F | Ht 65.0 in | Wt 221.6 lb

## 2021-02-15 DIAGNOSIS — I10 Essential (primary) hypertension: Secondary | ICD-10-CM

## 2021-02-15 DIAGNOSIS — E661 Drug-induced obesity: Secondary | ICD-10-CM | POA: Diagnosis not present

## 2021-02-15 DIAGNOSIS — R7303 Prediabetes: Secondary | ICD-10-CM | POA: Diagnosis not present

## 2021-02-15 DIAGNOSIS — Z6836 Body mass index (BMI) 36.0-36.9, adult: Secondary | ICD-10-CM

## 2021-02-15 DIAGNOSIS — J301 Allergic rhinitis due to pollen: Secondary | ICD-10-CM

## 2021-02-15 LAB — POCT GLYCOSYLATED HEMOGLOBIN (HGB A1C): Hemoglobin A1C: 6.1 % — AB (ref 4.0–5.6)

## 2021-02-15 NOTE — Patient Instructions (Addendum)
A1C is better at 6.1 Improved Keep doing what you are doing!   Keep working on low glycemic diet  Try to get most of your carbohydrates from produce (with the exception of white potatoes)  Eat less bread/pasta/rice/snack foods/cereals/sweets and other items from the middle of the grocery store (processed carbs)

## 2021-02-15 NOTE — Assessment & Plan Note (Signed)
bp in fair control at this time  BP Readings from Last 1 Encounters:  02/15/21 124/72   No changes needed Most recent labs reviewed  Disc lifstyle change with low sodium diet and exercise  Plan to continue amlodipine 5 mg daily and losartan hct 50-12.5 mg daily

## 2021-02-15 NOTE — Progress Notes (Signed)
Subjective:    Patient ID: Jennifer Rose, female    DOB: [birth year], 65 y.o.   MRN: [id number]  This visit occurred during the SARS-CoV-2 public health emergency.  Safety protocols were in place, including screening questions prior to the visit, additional usage of staff PPE, and extensive cleaning of exam room while observing appropriate contact time as indicated for disinfecting solutions.    HPI Pt presents for f/u of prediabetes/ chronic medical problems   Wt Readings from Last 3 Encounters:  02/15/21 221 lb 9 oz (100.5 kg)  11/15/20 224 lb 2 oz (101.7 kg)  04/26/20 225 lb 3.2 oz (102.2 kg)   36.87 kg/m  Feeling pretty good  Joints are a little sore  Prediabetes Last visit a1c went up above 6.5  Lab Results  Component Value Date   HGBA1C 6.7 (H) 11/09/2020   Discussed goal of low glycemic diet and exercise  Has been watching diet  Up to walking per day 2 mi  Avoids bread and pasta  Not buying or making sweets (infrequent)  Her appetite is not great   Today  Lab Results  Component Value Date   HGBA1C 6.1 (A) 02/15/2021  improved ! Thrilled about this    HTN bp is stable today  No cp or palpitations or headaches or edema  No side effects to medicines  BP Readings from Last 3 Encounters:  02/15/21 124/72  11/15/20 118/68  04/26/20 132/80     Amlodipine 5 mg daily  Losartan hct 50-12.5 mg daily  Having problems with R ear  Using flonase and that helps some  A little discomfort  No change in hearing    Patient Active Problem List   Diagnosis Date Noted  . Right ear pain 06/15/2020  . Headache 04/26/2020  . Colon cancer screening 11/12/2019  . H/O migraine 08/26/2018  . Routine general medical examination at a health care facility 08/26/2018  . Hypokalemia 08/26/2018  . Thrombocytopenia ([place]) 04/30/2017  . Prediabetes 12/13/2014  . 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 36.9 in adult 12/13/2014   . GLAUCOMA 04/15/2009  . LUPUS 11/09/2008  . Allergic rhinitis 09/24/2007  . Essential hypertension 03/22/2004   Past Medical History:  Diagnosis Date  . Allergy    allergic rhinitis  . Hypertension 03/22/2004  . Lupus ([place])   . Uterine fibroid 11/01   per GYN   Past Surgical History:  Procedure Laterality Date  . BREAST CYST EXCISION Right 36 yrs ago   x2  . [facility]    . ENDOMETRIAL ABLATION  04/03   polyps?DUB   Social History   Tobacco Use  . Smoking status: Never Smoker  . Smokeless tobacco: Never Used  Substance Use Topics  . Alcohol use: No    Alcohol/week: 0.0 standard drinks  . Drug use: No   History reviewed. No pertinent family history. No Known Allergies Current Outpatient Medications on File Prior to Visit  Medication Sig Dispense Refill  . amLODipine (NORVASC) 5 MG tablet Take 1 tablet (5 mg total) by mouth daily. 90 tablet 3  . cholecalciferol (VITAMIN D) 1000 UNITS tablet Take 2,000 Units by mouth daily.    . fexofenadine (ALLEGRA ALLERGY) 180 MG tablet Take 1 tablet (180 mg total) by mouth daily. (Patient taking differently: Take 180 mg by mouth daily as needed.) 30 tablet 1  . fluticasone (FLONASE) 50 MCG/ACT nasal spray Place 1-2 sprays into both nostrils daily. 16 g  5  . hydroxychloroquine (PLAQUENIL) 200 MG tablet Take 200 mg by mouth 2 (two) times daily.    Marland Kitchen latanoprost (XALATAN) 0.005 % ophthalmic solution Place 1 drop into both eyes as directed.    Marland Kitchen losartan-hydrochlorothiazide (HYZAAR) 50-12.5 MG tablet Take 1 tablet by mouth daily. 90 tablet 3  . meclizine (ANTIVERT) 12.5 MG tablet Take 12.5 mg by mouth 3 (three) times daily as needed.    . potassium chloride (KLOR-CON) 10 MEQ tablet Take 2 tablets (20 mEq total) by mouth daily. 180 tablet 3   No current facility-administered medications on file prior to visit.    Review of Systems  Constitutional: Negative for activity change, appetite change, fatigue, fever and unexpected weight change.   HENT: Negative for congestion, ear pain, rhinorrhea, sinus pressure and sore throat.   Eyes: Negative for pain, redness and visual disturbance.  Respiratory: Negative for cough, shortness of breath and wheezing.   Cardiovascular: Negative for chest pain and palpitations.  Gastrointestinal: Negative for abdominal pain, blood in stool, constipation and diarrhea.  Endocrine: Negative for polydipsia and polyuria.  Genitourinary: Negative for dysuria, frequency and urgency.  Musculoskeletal: Positive for arthralgias. Negative for back pain and myalgias.  Skin: Negative for pallor and rash.  Allergic/Immunologic: Negative for environmental allergies.  Neurological: Negative for dizziness, syncope and headaches.  Hematological: Negative for adenopathy. Does not bruise/bleed easily.  Psychiatric/Behavioral: Negative for decreased concentration and dysphoric mood. The patient is not nervous/anxious.        Objective:   Physical Exam Constitutional:      General: She is not in acute distress.    Appearance: Normal appearance. She is well-developed. She is obese. She is not ill-appearing or diaphoretic.  HENT:     Head: Normocephalic and atraumatic.  Eyes:     Conjunctiva/sclera: Conjunctivae normal.     Pupils: Pupils are equal, round, and reactive to light.  Neck:     Thyroid: No thyromegaly.     Vascular: No carotid bruit or JVD.  Cardiovascular:     Rate and Rhythm: Normal rate and regular rhythm.     Heart sounds: Normal heart sounds. No gallop.   Pulmonary:     Effort: Pulmonary effort is normal. No respiratory distress.     Breath sounds: Normal breath sounds. No wheezing or rales.  Abdominal:     General: Bowel sounds are normal. There is no distension or abdominal bruit.     Palpations: Abdomen is soft. There is no mass.     Tenderness: There is no abdominal tenderness.  Musculoskeletal:     Cervical back: Normal range of motion and neck supple.  Lymphadenopathy:      Cervical: No cervical adenopathy.  Skin:    General: Skin is warm and dry.     Coloration: Skin is not pale.     Findings: No erythema or rash.  Neurological:     Mental Status: She is alert.     Coordination: Coordination normal.     Deep Tendon Reflexes: Reflexes are normal and symmetric. Reflexes normal.  Psychiatric:        Mood and Affect: Mood normal.           Assessment & Plan:   Problem List Items Addressed This Visit      Cardiovascular and Mediastinum   Essential hypertension    bp in fair control at this time  BP Readings from Last 1 Encounters:  02/15/21 124/72   No changes needed Most recent labs reviewed  Disc lifstyle change with low sodium diet and exercise  Plan to continue amlodipine 5 mg daily and losartan hct 50-12.5 mg daily        Respiratory   Allergic rhinitis    Encouraged to use flonase to help with congestion and ETD        Other   Prediabetes - Primary    Lab Results  Component Value Date   HGBA1C 6.1 (A) 02/15/2021   Improved  Commended for improved diet and exercise  Enc her to keep up the good work        Relevant Orders   POCT glycosylated hemoglobin (Hb A1C) (Completed)   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 36.9 in adult    Discussed how this problem influences overall health and the risks it imposes  Reviewed plan for weight loss with lower calorie diet (via better food choices and also portion control or program like weight watchers) and exercise building up to or more than 30 minutes 5 days per week including some aerobic activity   Enc her to keep working on lifestyle change

## 2021-02-15 NOTE — Assessment & Plan Note (Signed)
Encouraged to use flonase to help with congestion and ETD

## 2021-02-15 NOTE — Assessment & Plan Note (Signed)
Lab Results  Component Value Date   HGBA1C 6.1 (A) 02/15/2021   Improved  Commended for improved diet and exercise  Enc her to keep up the good work

## 2021-02-15 NOTE — Assessment & Plan Note (Signed)
Discussed how this problem influences overall health and the risks it imposes  Reviewed plan for weight loss with lower calorie diet (via better food choices and also portion control or program like weight watchers) and exercise building up to or more than 30 minutes 5 days per week including some aerobic activity   Enc her to keep working on lifestyle change

## 2021-07-13 ENCOUNTER — Other Ambulatory Visit: Payer: Self-pay

## 2021-07-13 ENCOUNTER — Encounter: Payer: Self-pay | Admitting: Family Medicine

## 2021-07-13 ENCOUNTER — Ambulatory Visit: Payer: BC Managed Care – PPO | Admitting: Family Medicine

## 2021-07-13 VITALS — BP 120/70 | HR 63 | Temp 98.0°F | Ht 64.0 in | Wt 220.1 lb

## 2021-07-13 DIAGNOSIS — J0100 Acute maxillary sinusitis, unspecified: Secondary | ICD-10-CM

## 2021-07-13 DIAGNOSIS — H8301 Labyrinthitis, right ear: Secondary | ICD-10-CM

## 2021-07-13 DIAGNOSIS — B9789 Other viral agents as the cause of diseases classified elsewhere: Secondary | ICD-10-CM | POA: Diagnosis not present

## 2021-07-13 MED ORDER — AMOXICILLIN-POT CLAVULANATE 875-125 MG PO TABS: 1.0000 | ORAL_TABLET | Freq: Two times a day (BID) | ORAL | 0 refills | Status: DC

## 2021-07-13 NOTE — Progress Notes (Signed)
Kiyara Bouffard T. Xaviar Lunn, MD, [place] at [facility] [place], [address]  Phone: [phone]  FAX: [place] - 65 y.o. female  MRN [id number]  Date of Birth: 06-30-1956  Date: 07/13/2021  PCP: Abner Greenspan, MD  Referral: Abner Greenspan, MD  Chief Complaint  Patient presents with   Nasal Congestion    2 negative home Covid test-Symptoms x 1 week   Ear Drainage    Right   Headache   Tinnitus    Left   Facial Pressure     Around eyes    This visit occurred during the SARS-CoV-2 public health emergency.  Safety protocols were in place, including screening questions prior to the visit, additional usage of staff PPE, and extensive cleaning of exam room while observing appropriate contact time as indicated for disinfecting solutions.   Subjective:   Jennifer Rose is a 65 y.o. very pleasant female patient with Body mass index is 37.78 kg/m. who presents with the following:  She is a very pleasant young lady who at baseline does have lupus but is followed by rheumatology and she does take Plaquenil.  For the last 7 days she has had some runny nose and congestion.  She also has had some ear fullness and a feeling of drainage on the right with some tenderness on the left.  She has some pressure and pain behind her right eye.  She also has had a right-sided headache.  Yesterday, 6 days into her illness, the patient did take 2 negative at home COVID test.  Sometimes will have some ringing n her ear or running.  The nshe will have some drainage.  Also will have some dizziness. Pressure around her eyes.   Drainage will make feel like she is   Felt sick for about a week.  True vertigo. R sided headache.   Two neg covid tests negative.       Review of Systems is noted in the HPI, as appropriate  Objective:   BP 120/70   Pulse 63   Temp 98 F (36.7 C) (Temporal)   Ht 5\' 4"  (1.626 m)    Wt 220 lb 2 oz (99.8 kg)   SpO2 100%   BMI 37.78 kg/m    Gen: WDWN, NAD; alert,appropriate and cooperative throughout exam  HEENT: Normocephalic and atraumatic. Throat clear, w/o exudate, no LAD, R TM clear, L TM - good landmarks, No fluid present. rhinnorhea.  Left frontal and maxillary sinuses: non-Tender Right frontal and maxillary sinuses: Tender  Neck: No ant or post LAD CV: RRR, No M/G/R Pulm: Breathing comfortably in no resp distress. no w/c/r Psych: full affect, pleasant   Laboratory and Imaging Data:  Assessment and Plan:     ICD-10-CM   1. Acute non-recurrent maxillary sinusitis  J01.00     2. Viral labyrinthitis of right ear  H83.01    B97.89      History and exam consistent with right-sided sinusitis past viral syndrome.  7 days into illness.  2 negative COVID test yesterday.  I am good to have the patient continue to mask over the next 3 to 4 days, and she was planning to do this to begin with.  Augmentin and supportive care.  We also talked about viral labyrinthitis.  She also has some tinnitus  Meds ordered this encounter  Medications   amoxicillin-clavulanate (AUGMENTIN) 875-125 MG tablet    Sig: Take 1  tablet by mouth 2 (two) times daily.    Dispense:  20 tablet    Refill:  0   Medications Discontinued During This Encounter  Medication Reason   fexofenadine (ALLEGRA ALLERGY) 416 MG tablet Duplicate   No orders of the defined types were placed in this encounter.   Follow-up: No follow-ups on file.  [employer] One speech-to-text software was used for transcription in this dictation.  Possible transcriptional errors can occur using Editor, commissioning.   Signed,  Maud Deed. Lillyauna Jenkinson, MD   Outpatient Encounter Medications as of 07/13/2021  Medication Sig   amLODipine (NORVASC) 5 MG tablet Take 1 tablet (5 mg total) by mouth daily.   amoxicillin-clavulanate (AUGMENTIN) 875-125 MG tablet Take 1 tablet by mouth 2 (two) times daily.    cholecalciferol (VITAMIN D) 1000 UNITS tablet Take 2,000 Units by mouth daily.   fexofenadine (ALLEGRA) 180 MG tablet Take 180 mg by mouth daily as needed for allergies or rhinitis.   fluticasone (FLONASE) 50 MCG/ACT nasal spray Place 1-2 sprays into both nostrils daily.   hydroxychloroquine (PLAQUENIL) 200 MG tablet Take 200 mg by mouth 2 (two) times daily.   latanoprost (XALATAN) 0.005 % ophthalmic solution Place 1 drop into both eyes as directed.   losartan-hydrochlorothiazide (HYZAAR) 50-12.5 MG tablet Take 1 tablet by mouth daily.   meclizine (ANTIVERT) 12.5 MG tablet Take 12.5 mg by mouth 3 (three) times daily as needed.   potassium chloride (KLOR-CON) 10 MEQ tablet Take 2 tablets (20 mEq total) by mouth daily.   predniSONE (DELTASONE) 5 MG tablet Take 5 mg by mouth daily as needed.   [DISCONTINUED] fexofenadine (ALLEGRA ALLERGY) 180 MG tablet Take 1 tablet (180 mg total) by mouth daily. (Patient taking differently: Take 180 mg by mouth daily as needed.)   No facility-administered encounter medications on file as of 07/13/2021.

## 2021-07-26 ENCOUNTER — Other Ambulatory Visit: Payer: Self-pay | Admitting: Obstetrics and Gynecology

## 2021-07-26 DIAGNOSIS — Z1231 Encounter for screening mammogram for malignant neoplasm of breast: Secondary | ICD-10-CM

## 2021-08-28 ENCOUNTER — Ambulatory Visit
Admission: RE | Admit: 2021-08-28 | Discharge: 2021-08-28 | Disposition: A | Discharge status: Home / Self Care | Payer: BC Managed Care – PPO | Source: Ambulatory Visit | Attending: Obstetrics and Gynecology | Admitting: Obstetrics and Gynecology

## 2021-08-28 ENCOUNTER — Other Ambulatory Visit: Payer: Self-pay

## 2021-08-28 DIAGNOSIS — Z1231 Encounter for screening mammogram for malignant neoplasm of breast: Secondary | ICD-10-CM

## 2021-10-31 ENCOUNTER — Telehealth: Payer: Self-pay | Admitting: Family Medicine

## 2021-10-31 NOTE — Telephone Encounter (Signed)
Please reschedule CPE last CPE was 11/15/20, needs to be after 25th date. Thank you

## 2021-10-31 NOTE — Telephone Encounter (Signed)
Jennifer Rose called in and set up for her CPE . She has her labs on 1/17 in [place] and 1/24 for Dr. Darene Lamer.

## 2021-11-02 ENCOUNTER — Telehealth: Payer: Self-pay | Admitting: *Deleted

## 2021-11-02 DIAGNOSIS — D696 Thrombocytopenia, unspecified: Secondary | ICD-10-CM

## 2021-11-02 DIAGNOSIS — R7303 Prediabetes: Secondary | ICD-10-CM

## 2021-11-02 DIAGNOSIS — I10 Essential (primary) hypertension: Secondary | ICD-10-CM

## 2021-11-02 NOTE — Telephone Encounter (Signed)
Please place future orders for lab appt.  

## 2021-11-07 ENCOUNTER — Other Ambulatory Visit: Payer: Self-pay

## 2021-11-07 ENCOUNTER — Other Ambulatory Visit (INDEPENDENT_AMBULATORY_CARE_PROVIDER_SITE_OTHER): Payer: BC Managed Care – PPO

## 2021-11-07 DIAGNOSIS — D696 Thrombocytopenia, unspecified: Secondary | ICD-10-CM

## 2021-11-07 DIAGNOSIS — R7303 Prediabetes: Secondary | ICD-10-CM | POA: Diagnosis not present

## 2021-11-07 DIAGNOSIS — I10 Essential (primary) hypertension: Secondary | ICD-10-CM | POA: Diagnosis not present

## 2021-11-07 LAB — CBC WITH DIFFERENTIAL/PLATELET
Basophils Absolute: 0 10*3/uL (ref 0.0–0.1)
Basophils Relative: 0.1 % (ref 0.0–3.0)
Eosinophils Absolute: 0 10*3/uL (ref 0.0–0.7)
Eosinophils Relative: 0.5 % (ref 0.0–5.0)
HCT: 39.4 % (ref 36.0–46.0)
Hemoglobin: 12.6 g/dL (ref 12.0–15.0)
Lymphocytes Relative: 9.6 % — ABNORMAL LOW (ref 12.0–46.0)
Lymphs Abs: 0.7 10*3/uL (ref 0.7–4.0)
MCHC: 32.1 g/dL (ref 30.0–36.0)
MCV: 81.5 fl (ref 78.0–100.0)
Monocytes Absolute: 0.8 10*3/uL (ref 0.1–1.0)
Monocytes Relative: 11.2 % (ref 3.0–12.0)
Neutro Abs: 5.4 10*3/uL (ref 1.4–7.7)
Neutrophils Relative %: 78.6 % — ABNORMAL HIGH (ref 43.0–77.0)
Platelets: 117 10*3/uL — ABNORMAL LOW (ref 150.0–400.0)
RBC: 4.84 Mil/uL (ref 3.87–5.11)
RDW: 13.4 % (ref 11.5–15.5)
WBC: 6.9 10*3/uL (ref 4.0–10.5)

## 2021-11-07 LAB — COMPREHENSIVE METABOLIC PANEL
ALT: 26 U/L (ref 0–35)
AST: 24 U/L (ref 0–37)
Albumin: 3.9 g/dL (ref 3.5–5.2)
Alkaline Phosphatase: 89 U/L (ref 39–117)
BUN: 19 mg/dL (ref 6–23)
CO2: 27 mEq/L (ref 19–32)
Calcium: 9.3 mg/dL (ref 8.4–10.5)
Chloride: 102 mEq/L (ref 96–112)
Creatinine, Ser: 1.18 mg/dL (ref 0.40–1.20)
GFR: 49.11 mL/min — ABNORMAL LOW (ref >60.00)
Glucose, Bld: 102 mg/dL — ABNORMAL HIGH (ref 70–99)
Potassium: 3.7 mEq/L (ref 3.5–5.1)
Sodium: 137 mEq/L (ref 135–145)
Total Bilirubin: 0.5 mg/dL (ref 0.2–1.2)
Total Protein: 7.5 g/dL (ref 6.0–8.3)

## 2021-11-07 LAB — LIPID PANEL
Cholesterol: 151 mg/dL (ref 0–200)
HDL: 40.4 mg/dL (ref >39.00)
LDL Cholesterol: 88 mg/dL (ref 0–99)
NonHDL: 110.69
Total CHOL/HDL Ratio: 4
Triglycerides: 112 mg/dL (ref 0.0–149.0)
VLDL: 22.4 mg/dL (ref 0.0–40.0)

## 2021-11-07 LAB — TSH: TSH: 1.99 u[IU]/mL (ref 0.35–5.50)

## 2021-11-07 LAB — HEMOGLOBIN A1C: Hgb A1c MFr Bld: 6.7 % — ABNORMAL HIGH (ref 4.6–6.5)

## 2021-11-14 ENCOUNTER — Encounter: Payer: BC Managed Care – PPO | Admitting: Family Medicine

## 2021-11-16 ENCOUNTER — Other Ambulatory Visit: Payer: Self-pay

## 2021-11-16 ENCOUNTER — Encounter: Payer: Self-pay | Admitting: Family Medicine

## 2021-11-16 ENCOUNTER — Ambulatory Visit (INDEPENDENT_AMBULATORY_CARE_PROVIDER_SITE_OTHER): Payer: BC Managed Care – PPO | Admitting: Family Medicine

## 2021-11-16 VITALS — BP 134/78 | HR 73 | Temp 97.4°F | Ht 64.5 in | Wt 215.5 lb

## 2021-11-16 DIAGNOSIS — D696 Thrombocytopenia, unspecified: Secondary | ICD-10-CM

## 2021-11-16 DIAGNOSIS — Z6836 Body mass index (BMI) 36.0-36.9, adult: Secondary | ICD-10-CM

## 2021-11-16 DIAGNOSIS — I10 Essential (primary) hypertension: Secondary | ICD-10-CM | POA: Diagnosis not present

## 2021-11-16 DIAGNOSIS — E661 Drug-induced obesity: Secondary | ICD-10-CM

## 2021-11-16 DIAGNOSIS — Z0000 Encounter for general adult medical examination without abnormal findings: Secondary | ICD-10-CM | POA: Diagnosis not present

## 2021-11-16 DIAGNOSIS — R7303 Prediabetes: Secondary | ICD-10-CM | POA: Diagnosis not present

## 2021-11-16 NOTE — Patient Instructions (Addendum)
Try to get most of your carbohydrates from produce (with the exception of white potatoes)  Eat less bread/pasta/rice/snack foods/cereals/sweets and other items from the middle of the grocery store (processed carbs)  Aim for exercise 30 or more minutes daily  Eat healthy   Follow up in 3 months for prediabetes -we will re check your a1c at that visit   Take care of yourself

## 2021-11-16 NOTE — Progress Notes (Signed)
Subjective:    Patient ID: Jennifer Rose, female    DOB: Jun 10, 1956, 66 y.o.   MRN: [id number]  This visit occurred during the SARS-CoV-2 public health emergency.  Safety protocols were in place, including screening questions prior to the visit, additional usage of staff PPE, and extensive cleaning of exam room while observing appropriate contact time as indicated for disinfecting solutions.   HPI Here for health maintenance exam and to review chronic medical problems    Wt Readings from Last 3 Encounters:  11/16/21 215 lb 8 oz (97.8 kg)  07/13/21 220 lb 2 oz (99.8 kg)  02/15/21 221 lb 9 oz (100.5 kg)   36.42 kg/m  Doing well overall   Trying to maintain her health Wt is down 5 lb , watching her diet closely and walks when it is not too cold  Cut out some bread and carbs  Fruit instead of sweets    Zoster status : not ready for shingrix  Flu shot : in October  Tdap 10/2020 Covid vaccinated -sept   Pap 09/2018 -negative She has gyn visit in march    Mammogram 08/2021  Self breast exam : no lumps   Colonoscopy 03/2020  HTN bp is stable today  No cp or palpitations or headaches or edema  No side effects to medicines  BP Readings from Last 3 Encounters:  11/16/21 134/78  07/13/21 120/70  02/15/21 124/72      Amlodipine 5 mg daily  Losartan hct 50-12.5 mg daily   SLE: doing ok overall  /Dr Marijean Bravo  Rainy cold days are bad/ otherwise no flares  Had to take prednisone in december  Lab Results  Component Value Date   CREATININE 1.18 11/07/2021   BUN 19 11/07/2021   NA 137 11/07/2021   K 3.7 11/07/2021   CL 102 11/07/2021   CO2 27 11/07/2021   GFR of 49 - down a little   Thrombocytopenia Lab Results  Component Value Date   WBC 6.9 11/07/2021   HGB 12.6 11/07/2021   HCT 39.4 11/07/2021   MCV 81.5 11/07/2021   PLT 117.0 (L) 11/07/2021   Prediabetes Lab Results  Component Value Date   HGBA1C 6.7 (H) 11/07/2021   Less baking and less sweets  Had  prednisone in December   (tends to flare every 3 months and has to take it)  Up from 6.1 Walks 2 mi per day     Cholesterol Lab Results  Component Value Date   CHOL 151 11/07/2021   CHOL 150 11/09/2020   CHOL 160 11/04/2019   Lab Results  Component Value Date   HDL 40.40 11/07/2021   HDL 42.70 11/09/2020   HDL 39.30 11/04/2019   Lab Results  Component Value Date   LDLCALC 88 11/07/2021   LDLCALC 86 11/09/2020   LDLCALC 105 (H) 11/04/2019   Lab Results  Component Value Date   TRIG 112.0 11/07/2021   TRIG 107.0 11/09/2020   TRIG 81.0 11/04/2019   Lab Results  Component Value Date   CHOLHDL 4 11/07/2021   CHOLHDL 4 11/09/2020   CHOLHDL 4 11/04/2019   No results found for: LDLDIRECT   Lab Results  Component Value Date   TSH 1.99 11/07/2021    Lab Results  Component Value Date   ALT 26 11/07/2021   AST 24 11/07/2021   ALKPHOS 89 11/07/2021   BILITOT 0.5 11/07/2021   Patient Active Problem List   Diagnosis Date Noted   Colon cancer screening 11/12/2019  H/O migraine 08/26/2018   Routine general medical examination at a health care facility 08/26/2018   Hypokalemia 08/26/2018   Thrombocytopenia ([place]) 04/30/2017   Prediabetes 12/13/2014   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 36.9 in adult 12/13/2014   GLAUCOMA 04/15/2009   LUPUS 11/09/2008   Allergic rhinitis 09/24/2007   Essential hypertension 03/22/2004   Past Medical History:  Diagnosis Date   Allergy    allergic rhinitis   Hypertension 03/22/2004   Lupus ([place])    Uterine fibroid 11/01   per GYN   Past Surgical History:  Procedure Laterality Date   BREAST CYST EXCISION Right 36 yrs ago   x2   ccy     ENDOMETRIAL ABLATION  04/03   polyps?DUB   Social History   Tobacco Use   Smoking status: Never   Smokeless tobacco: Never  Substance Use Topics   Alcohol use: No    Alcohol/week: 0.0 standard drinks   Drug use: No   History  reviewed. No pertinent family history. No Known Allergies Current Outpatient Medications on File Prior to Visit  Medication Sig Dispense Refill   amLODipine (NORVASC) 5 MG tablet Take 1 tablet (5 mg total) by mouth daily. 90 tablet 3   cholecalciferol (VITAMIN D) 1000 UNITS tablet Take 2,000 Units by mouth daily.     fexofenadine (ALLEGRA) 180 MG tablet Take 180 mg by mouth daily as needed for allergies or rhinitis.     fluticasone (FLONASE) 50 MCG/ACT nasal spray Place 1-2 sprays into both nostrils daily. 16 g 5   hydroxychloroquine (PLAQUENIL) 200 MG tablet Take 200 mg by mouth 2 (two) times daily.     latanoprost (XALATAN) 0.005 % ophthalmic solution Place 1 drop into both eyes as directed.     losartan-hydrochlorothiazide (HYZAAR) 50-12.5 MG tablet Take 1 tablet by mouth daily. 90 tablet 3   meclizine (ANTIVERT) 12.5 MG tablet Take 12.5 mg by mouth 3 (three) times daily as needed.     potassium chloride (KLOR-CON) 10 MEQ tablet Take 2 tablets (20 mEq total) by mouth daily. 180 tablet 3   predniSONE (DELTASONE) 5 MG tablet Take 5 mg by mouth daily as needed.     No current facility-administered medications on file prior to visit.    Review of Systems  Constitutional:  Positive for fatigue. Negative for activity change, appetite change, fever and unexpected weight change.  HENT:  Negative for congestion, ear pain, rhinorrhea, sinus pressure and sore throat.        Occ R ear   Eyes:  Negative for pain, redness and visual disturbance.  Respiratory:  Negative for cough, shortness of breath and wheezing.   Cardiovascular:  Negative for chest pain and palpitations.  Gastrointestinal:  Negative for abdominal pain, blood in stool, constipation and diarrhea.  Endocrine: Negative for polydipsia and polyuria.  Genitourinary:  Negative for dysuria, frequency and urgency.  Musculoskeletal:  Positive for arthralgias. Negative for back pain and myalgias.  Skin:  Negative for pallor and rash.   Allergic/Immunologic: Negative for environmental allergies.  Neurological:  Negative for dizziness, syncope and headaches.  Hematological:  Negative for adenopathy. Does not bruise/bleed easily.  Psychiatric/Behavioral:  Negative for decreased concentration and dysphoric mood. The patient is not nervous/anxious.       Objective:   Physical Exam Constitutional:      General: She is not in acute distress.    Appearance: Normal appearance. She is well-developed. She is obese. She  is not ill-appearing or diaphoretic.  HENT:     Head: Normocephalic and atraumatic.     Right Ear: Tympanic membrane, ear canal and external ear normal.     Left Ear: Tympanic membrane, ear canal and external ear normal.     Nose: Nose normal. No congestion.     Mouth/Throat:     Mouth: Mucous membranes are moist.     Pharynx: Oropharynx is clear. No posterior oropharyngeal erythema.  Eyes:     General: No scleral icterus.    Extraocular Movements: Extraocular movements intact.     Conjunctiva/sclera: Conjunctivae normal.     Pupils: Pupils are equal, round, and reactive to light.  Neck:     Thyroid: No thyromegaly.     Vascular: No carotid bruit or JVD.  Cardiovascular:     Rate and Rhythm: Normal rate and regular rhythm.     Pulses: Normal pulses.     Heart sounds: Normal heart sounds.    No gallop.  Pulmonary:     Effort: Pulmonary effort is normal. No respiratory distress.     Breath sounds: Normal breath sounds. No wheezing.     Comments: Good air exch Chest:     Chest wall: No tenderness.  Abdominal:     General: Bowel sounds are normal. There is no distension or abdominal bruit.     Palpations: Abdomen is soft. There is no mass.     Tenderness: There is no abdominal tenderness.     Hernia: No hernia is present.  Genitourinary:    Comments: Breast exam: No mass, nodules, thickening, tenderness, bulging, retraction, inflamation, nipple discharge or skin changes noted.  No axillary or  clavicular LA.     Musculoskeletal:        General: No tenderness. Normal range of motion.     Cervical back: Normal range of motion and neck supple. No rigidity. No muscular tenderness.     Right lower leg: No edema.     Left lower leg: No edema.     Comments: No kyphosis   Lymphadenopathy:     Cervical: No cervical adenopathy.  Skin:    General: Skin is warm and dry.     Coloration: Skin is not pale.     Findings: No erythema or rash.     Comments: Few skin tags and lentigines  Neurological:     Mental Status: She is alert. Mental status is at baseline.     Cranial Nerves: No cranial nerve deficit.     Motor: No abnormal muscle tone.     Coordination: Coordination normal.     Gait: Gait normal.     Deep Tendon Reflexes: Reflexes are normal and symmetric. Reflexes normal.  Psychiatric:        Mood and Affect: Mood normal.        Cognition and Memory: Cognition and memory normal.          Assessment & Plan:   Problem List Items Addressed This Visit       Cardiovascular and Mediastinum   Essential hypertension    bp in fair control at this time  BP Readings from Last 1 Encounters:  11/16/21 134/78  No changes needed Most recent labs reviewed  Disc lifstyle change with low sodium diet and exercise  Plan to continue Amlodipine 5 mg daily  Losartan hct 50-12.5 mg daily        Hematopoietic and Hemostatic   Thrombocytopenia ([facility])    In setting of SLE Fairly  stable Pl ct 117 Continues f/u with rheumatology No new bleeding or bruising  Will continue to monitor        Other   Prediabetes    Lab Results  Component Value Date   HGBA1C 6.7 (H) 11/07/2021  She has to take prednisone off/on  Last in dec-affected her number  Eating better and exercising more now  disc imp of low glycemic diet and wt loss to prevent DM2  F/u 3 mo  If still up may consider tx (avoid metformin due to renal effects, may qualify for GLP med perhaps)       Class 2 drug-induced  obesity with serious comorbidity and body mass index (BMI) of 36.0 to 36.9 in adult    Discussed how this problem influences overall health and the risks it imposes  Reviewed plan for weight loss with lower calorie diet (via better food choices and also portion control or program like weight watchers) and exercise building up to or more than 30 minutes 5 days per week including some aerobic activity         Routine general medical examination at a health care facility - Primary    Reviewed health habits including diet and exercise and skin cancer prevention Reviewed appropriate screening tests for age  Also reviewed health mt list, fam hx and immunization status , as well as social and family history   See HPI Labs reviewed  Declines shingrix for now  covid vaccinated  Pap utd/sees gyn Mammogram utd Colonoscopy utd

## 2021-11-17 NOTE — Assessment & Plan Note (Signed)
Reviewed health habits including diet and exercise and skin cancer prevention Reviewed appropriate screening tests for age  Also reviewed health mt list, fam hx and immunization status , as well as social and family history   See HPI Labs reviewed  Declines shingrix for now  covid vaccinated  Pap utd/sees gyn Mammogram utd Colonoscopy utd

## 2021-11-17 NOTE — Assessment & Plan Note (Signed)
In setting of SLE Fairly stable Pl ct 117 Continues f/u with rheumatology No new bleeding or bruising  Will continue to monitor

## 2021-11-17 NOTE — Assessment & Plan Note (Signed)
bp in fair control at this time  BP Readings from Last 1 Encounters:  11/16/21 134/78   No changes needed Most recent labs reviewed  Disc lifstyle change with low sodium diet and exercise  Plan to continue Amlodipine 5 mg daily  Losartan hct 50-12.5 mg daily

## 2021-11-17 NOTE — Assessment & Plan Note (Signed)
Lab Results  Component Value Date   HGBA1C 6.7 (H) 11/07/2021   She has to take prednisone off/on  Last in dec-affected her number  Eating better and exercising more now  disc imp of low glycemic diet and wt loss to prevent DM2  F/u 3 mo  If still up may consider tx (avoid metformin due to renal effects, may qualify for GLP med perhaps)

## 2021-11-17 NOTE — Assessment & Plan Note (Signed)
Discussed how this problem influences overall health and the risks it imposes  Reviewed plan for weight loss with lower calorie diet (via better food choices and also portion control or program like weight watchers) and exercise building up to or more than 30 minutes 5 days per week including some aerobic activity    

## 2021-11-30 ENCOUNTER — Other Ambulatory Visit: Payer: Self-pay | Admitting: Ophthalmology

## 2021-11-30 DIAGNOSIS — H4710 Unspecified papilledema: Secondary | ICD-10-CM

## 2021-12-05 NOTE — Progress Notes (Addendum)
[facility]  12/12/2021     CHIEF COMPLAINT Patient presents for Retina Evaluation   HISTORY OF PRESENT ILLNESS: Jennifer Rose is a 66 y.o. female who presents to the clinic today for:   HPI     Retina Evaluation   In both eyes.  This started 2 weeks ago.  Duration of 2 weeks.  I, the attending physician,  performed the HPI with the patient and updated documentation appropriately.        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 Lucianne Lei. Pt reports she has not have any vision issues. Pt reports she is pre diabetic and does have Lupus. Pt has been on plaquenil since she was diagnosed which she reports was 27 years ago. Dr. Lucianne Lei ordered a CT scan on patient and it was done 2/16, results are in Epic. Pt is also a glaucoma suspect, high risk OU-currently taking Latanoprost 1 gtt OU QD.       Last edited by Bernarda Caffey, MD on 12/12/2021 12:18 PM.    Pt is here on the referral of Dr. Lucianne Lei for concern of Lupus retinopathy, pt used to see Dr. Kathlen Mody every 3 months bc she is on Plaquenil, pt sees Dr. Amil Amen for her Lupus, she has never been told she has any eye problems related to lupus or Plaquenil, she states Dr. Lucianne Lei saw swelling on her optic nerve at her last exam, pt states when her lupus flairs up it affects her muscles and joints and she is "down for a day", she takes prednisone occasionally during these flair ups, she states she only has maybe 2-3 flair ups a year, pt has been on Plaquenil for about 27 years, she is pre-diabetic, last A1c was 6.7 on 01.17.23  Referring physician: Arnoldo Hooker MD [address] [place], [place] [address]  HISTORICAL INFORMATION:   Selected notes from the MEDICAL RECORD NUMBER Referred by Dr. Monna Fam for papilledema OD, on plaquenil for lupus LEE: 11/28/2021 BCVA OD: 20/30 OS: 20/20 Ocular Hx- papilledema, glaucoma suspect PMH- lupus,     CURRENT  MEDICATIONS: Current Outpatient Medications (Ophthalmic Drugs)  Medication Sig   latanoprost (XALATAN) 0.005 % ophthalmic solution Place 1 drop into both eyes as directed.   No current facility-administered medications for this visit. (Ophthalmic Drugs)   Current Outpatient Medications (Other)  Medication Sig   amLODipine (NORVASC) 5 MG tablet Take 1 tablet (5 mg total) by mouth daily.   cholecalciferol (VITAMIN D) 1000 UNITS tablet Take 2,000 Units by mouth daily.   fexofenadine (ALLEGRA) 180 MG tablet Take 180 mg by mouth daily as needed for allergies or rhinitis.   fluticasone (FLONASE) 50 MCG/ACT nasal spray Place 1-2 sprays into both nostrils daily.   hydroxychloroquine (PLAQUENIL) 200 MG tablet Take 200 mg by mouth 2 (two) times daily.   losartan-hydrochlorothiazide (HYZAAR) 50-12.5 MG tablet Take 1 tablet by mouth daily.   meclizine (ANTIVERT) 12.5 MG tablet Take 12.5 mg by mouth 3 (three) times daily as needed.   potassium chloride (KLOR-CON) 10 MEQ tablet Take 2 tablets (20 mEq total) by mouth daily.   predniSONE (DELTASONE) 5 MG tablet Take 5 mg by mouth daily as needed.   No current facility-administered medications for this visit. (Other)   REVIEW OF SYSTEMS: ROS   Positive for: Endocrine, Cardiovascular, Eyes, Allergic/Imm Negative for: Constitutional, Gastrointestinal, Neurological, Skin, Genitourinary, Musculoskeletal, HENT, Respiratory, Psychiatric, Heme/Lymph Last edited by Moshe Cipro,  Makenzie E, COT on 12/12/2021  8:42 AM.     ALLERGIES No Known Allergies  PAST MEDICAL HISTORY Past Medical History:  Diagnosis Date   Allergy    allergic rhinitis   Hypertension 03/22/2004   Lupus ([place])    Uterine fibroid 11/01   per GYN   Past Surgical History:  Procedure Laterality Date   BREAST CYST EXCISION Right 36 yrs ago   x2   ccy     ENDOMETRIAL ABLATION  04/03   polyps?DUB   FAMILY HISTORY Family History  Problem Relation Age of Onset   Blindness Mother     Stroke Mother    Diabetes Mother    Glaucoma Mother    Glaucoma Father    SOCIAL HISTORY Social History   Tobacco Use   Smoking status: Never   Smokeless tobacco: Never  Substance Use Topics   Alcohol use: No    Alcohol/week: 0.0 standard drinks   Drug use: No       OPHTHALMIC EXAM:  Base Eye Exam     Visual Acuity (Snellen - Linear)       Right Left   Dist cc 20/20 -2 20/20    Correction: Glasses         Tonometry (Tonopen, 8:57 AM)       Right Left   Pressure 12 16         Pupils       Dark Light Shape React APD   Right 3 2 Round Brisk None   Left 3 2 Round Brisk None         Visual Fields (Counting fingers)       Left Right    Full Full         Extraocular Movement       Right Left    Full, Ortho Full, Ortho         Neuro/Psych     Oriented x3: Yes   Mood/Affect: Normal         Dilation     Both eyes: 1.0% Mydriacyl, 2.5% Phenylephrine @ 8:58 AM           Slit Lamp and Fundus Exam     Slit Lamp Exam       Right Left   Lids/Lashes Dermatochalasis - upper lid Dermatochalasis - upper lid   Conjunctiva/Sclera mild melanosis mild melanosis   Cornea mild arcus, trace, fine endo pigment Clear   Anterior Chamber Deep and quiet Deep and quiet   Iris Round and dilated, nevus at 0900 Round and dilated, nevus at 0900   Lens 2+ Nuclear sclerosis, 2+ Cortical cataract 2+ Nuclear sclerosis, 2+ Cortical cataract   Anterior Vitreous Vitreous syneresis, no cell or pigment Vitreous syneresis, no cell or pigment         Fundus Exam       Right Left   Disc +disc edema with CWS superior rim, +obscuration of vessels, +heme Pink and Sharp, focal PPP temporal   C/D Ratio 0.1 0.1   Macula Flat, Good foveal reflex, scattered IRH Flat, Good foveal reflex, scattered IRH   Vessels attenuated, Tortuous, Copper wiring, mild AV crossing changes attenuated, Tortuous, Copper wiring   Periphery Attached, 360 IRH, cluster of larger DBH temporal  periphery Attached, scattered MA           Refraction     Wearing Rx       Sphere Cylinder Axis Add   Right -1.50 +0.25 038 +2.75   Left -  0.75 +0.25 175 +2.75         Manifest Refraction       Sphere Cylinder Axis Dist VA   Right -1.00 +0.50 015 20/20-1   Left -0.75 +0.25 030 20/20            IMAGING AND PROCEDURES  Imaging and Procedures for 12/12/2021  OCT, Retina - OU - Both Eyes       Right Eye Quality was good. Central Foveal Thickness: 295. Progression has no prior data. Findings include normal foveal contour, no IRF, no SRF (?mild irregularity of lamination, disc edema, partial PVD, no ellipsoid loss).   Left Eye Quality was good. Central Foveal Thickness: 261. Progression has no prior data. Findings include normal foveal contour, no IRF, no SRF (Partial PVD, no ellipsoid loss).   Notes *Images captured and stored on drive  Diagnosis / Impression:  OD: +disc edema, NFP, no IRF/SRF OS: NFP, no IRF/SRF No ellipsoid loss OU -- no Plaquenil toxicity OU No DME  Clinical management:  See below  Abbreviations: NFP - Normal foveal profile. CME - cystoid macular edema. PED - pigment epithelial detachment. IRF - intraretinal fluid. SRF - subretinal fluid. EZ - ellipsoid zone. ERM - epiretinal membrane. ORA - outer retinal atrophy. ORT - outer retinal tubulation. SRHM - subretinal hyper-reflective material. IRHM - intraretinal hyper-reflective material      Fluorescein Angiography Optos (Transit OD)       Right Eye Progression has no prior data. Early phase findings include staining (Hyper fluorescence of disc). Mid/Late phase findings include staining, leakage, microaneurysm (Hyper fluorescent staining and leakage of disc, mild staining perifovea, mild perivascular leakage and staining).   Left Eye Progression has no prior data. Early phase findings include staining. Mid/Late phase findings include staining, microaneurysm (Punctate peripheral staining --  MA).   Notes **Images stored on drive**  Impression: OD: Hyper fluorescent staining and leakage of disc, mild staining perifovea, mild perivascular leakage and staining -- Lupus related papillitis / retinopathy OS: very mild punctate staining; mild MA Mild NPDR OU No vasculitis / vascular leakage             ASSESSMENT/PLAN:    ICD-10-CM   1. 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 Systemic lupus erythematosus with other organ involvement, unspecified SLE type ([place])  M32.19     3. Long-term use of Plaquenil  Z79.899     4. 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)  P50.9326 OCT, Retina - OU - Both Eyes    Fluorescein Angiography Optos (Transit OD)    5. Essential hypertension  I10     6. Hypertensive retinopathy of both eyes  H35.033 Fluorescein Angiography Optos (Transit OD)    7. Combined forms of age-related cataract of both eyes  H25.813      1. Optic disc edema OD  - exam shows +CWS on disc rim, obscuration of disc vessels, and +hemorrhages  - BCVA remains 20/20, no rAPD - first noted by Dr. Lucianne Lei on routine exam, pt asymptomatic - CT brain and orbits w and w/o contrastordered by Dr. Lucianne Lei and imaged 2.16.23 -- no retrobulbar or retro-orbital mass noted - FA 2.21.23 shows hyperfluorescent staining and leakage of disc OD - agree with Dr. 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 Amil Amen, re: adjustment of lupus medications  2,3. SLE  on plaquenil w/ papillitis and retinopathy OD  - exam shows diffuse IRH OD w/ cluster of DBH temporal periphery -- possible lupus retinopathy -- impending CRVO-like appearance  - no CME - will hold off on intravitreal anti-VEGF therapy for now  - pt on Plaquenil (hydroxychloroquine [HCQ]) use for Lupus for ~26 yrs - no retinal toxicity noted on exam or OCT today -  pt taking 400 mg daily for at least 1 year - pt reports wt is ~97.8 kg - 400/97.8 = 4.09 mg/kg/day - the AAO recommends daily dosing of < 5.0 mg/kg for HCQ - will discuss case with Dr. Amil Amen (Rheumatology) re: new ophthalmologic findings suggestive of lupus flare, consideration of additional therapy  4. Mild nonproliferative diabetic retinopathy w/o DME, both eyes - The incidence, risk factors for progression, natural history and treatment options for diabetic retinopathy were discussed with patient.   - The need for close monitoring of blood glucose, blood pressure, and serum lipids, avoiding cigarette or any type of tobacco, and the need for long term follow up was also discussed with patient. - exam shows very mild MA OU  - FA 02.21.23 with mild MA OU - OCT without diabetic macular edema, both eyes  - BCVA 20/20 OU - monitor  5,6. Hypertensive retinopathy OU - discussed importance of tight BP control - monitor  7. Mixed Cataract OU - The symptoms of cataract, surgical options, and treatments and risks were discussed with patient. - discussed diagnosis and progression - not yet visually significant - monitor for now  Ophthalmic Meds Ordered this visit:  No orders of the defined types were placed in this encounter.    Return in about 4 weeks (around 01/09/2022) for f/u disc edema OD, DFE, OCT.  There are no Patient Instructions on file for this visit.   Explained the diagnoses, plan, and follow up with the patient and they expressed understanding.  Patient expressed understanding of the importance of proper follow up care.   This document serves as a record of services personally performed by Gardiner Sleeper, MD, PhD. It was created on their behalf by San Jetty. Owens Shark, OA an ophthalmic technician. The creation of this record is the provider's dictation and/or activities during the visit.    Electronically signed by: San Jetty. Owens Shark, [place] 02.14.2023 12:52 AM   Gardiner Sleeper,  M.D., Ph.D. Diseases & Surgery of the Retina and Vitreous [facility]  I have reviewed the above documentation for accuracy and completeness, and I agree with the above. Gardiner Sleeper, M.D., Ph.D. 12/13/21 12:52 AM   Abbreviations: M myopia (nearsighted); A astigmatism; H hyperopia (farsighted); P presbyopia; Mrx spectacle prescription;  CTL contact lenses; OD right eye; OS left eye; OU both eyes  XT exotropia; ET esotropia; PEK punctate epithelial keratitis; PEE punctate epithelial erosions; DES dry eye syndrome; MGD meibomian gland dysfunction; ATs artificial tears; PFAT's preservative free artificial tears; [place] nuclear sclerotic cataract; PSC posterior subcapsular cataract; ERM epi-retinal membrane; PVD posterior vitreous detachment; RD retinal detachment; DM diabetes mellitus; DR diabetic retinopathy; NPDR non-proliferative diabetic retinopathy; PDR proliferative diabetic retinopathy; CSME clinically significant macular edema; DME diabetic macular edema; dbh dot blot hemorrhages; CWS cotton wool spot; POAG primary open angle glaucoma; C/D cup-to-disc ratio; HVF humphrey visual field; GVF goldmann visual field; OCT optical coherence tomography; IOP intraocular pressure; BRVO Branch retinal vein occlusion; CRVO central retinal vein occlusion; CRAO central retinal artery occlusion; BRAO branch retinal artery occlusion; RT retinal tear; SB scleral buckle;  PPV pars plana vitrectomy; VH Vitreous hemorrhage; PRP panretinal laser photocoagulation; IVK intravitreal kenalog; VMT vitreomacular traction; MH Macular hole;  NVD neovascularization of the disc; NVE neovascularization elsewhere; AREDS age related eye disease study; ARMD age related macular degeneration; POAG primary open angle glaucoma; EBMD epithelial/anterior basement membrane dystrophy; ACIOL anterior chamber intraocular lens; IOL intraocular lens; PCIOL posterior chamber intraocular lens; Phaco/IOL phacoemulsification with  intraocular lens placement; [place] photorefractive keratectomy; LASIK laser assisted in situ keratomileusis; HTN hypertension; DM diabetes mellitus; COPD chronic obstructive pulmonary disease

## 2021-12-07 ENCOUNTER — Ambulatory Visit
Admission: RE | Admit: 2021-12-07 | Discharge: 2021-12-07 | Disposition: A | Discharge status: Home / Self Care | Payer: BC Managed Care – PPO | Source: Ambulatory Visit | Attending: Ophthalmology | Admitting: Ophthalmology

## 2021-12-07 ENCOUNTER — Other Ambulatory Visit: Payer: Self-pay

## 2021-12-07 DIAGNOSIS — H4710 Unspecified papilledema: Secondary | ICD-10-CM

## 2021-12-07 MED ORDER — IOPAMIDOL (ISOVUE-300) INJECTION 61%
75.0000 mL | Freq: Once | INTRAVENOUS | Status: AC | PRN
  Administered 2021-12-07: 75 mL via INTRAVENOUS

## 2021-12-12 ENCOUNTER — Ambulatory Visit (INDEPENDENT_AMBULATORY_CARE_PROVIDER_SITE_OTHER): Payer: BC Managed Care – PPO | Admitting: Ophthalmology

## 2021-12-12 ENCOUNTER — Other Ambulatory Visit: Payer: Self-pay

## 2021-12-12 ENCOUNTER — Encounter (INDEPENDENT_AMBULATORY_CARE_PROVIDER_SITE_OTHER): Payer: Self-pay | Admitting: Ophthalmology

## 2021-12-12 DIAGNOSIS — H4710 Unspecified papilledema: Secondary | ICD-10-CM | POA: Diagnosis not present

## 2021-12-12 DIAGNOSIS — H35033 Hypertensive retinopathy, bilateral: Secondary | ICD-10-CM

## 2021-12-12 DIAGNOSIS — H3581 Retinal edema: Secondary | ICD-10-CM

## 2021-12-12 DIAGNOSIS — I10 Essential (primary) hypertension: Secondary | ICD-10-CM | POA: Diagnosis not present

## 2021-12-12 DIAGNOSIS — E113293 Type 2 diabetes mellitus with mild nonproliferative diabetic retinopathy without macular edema, bilateral: Secondary | ICD-10-CM

## 2021-12-12 DIAGNOSIS — Z79899 Other long term (current) drug therapy: Secondary | ICD-10-CM | POA: Diagnosis not present

## 2021-12-12 DIAGNOSIS — M3219 Other organ or system involvement in systemic lupus erythematosus: Secondary | ICD-10-CM | POA: Diagnosis not present

## 2021-12-12 DIAGNOSIS — H25813 Combined forms of age-related cataract, bilateral: Secondary | ICD-10-CM

## 2021-12-18 ENCOUNTER — Other Ambulatory Visit: Payer: Self-pay | Admitting: Family Medicine

## 2022-01-05 NOTE — Progress Notes (Addendum)
?[facility] Note ? ?01/09/2022 ? ?  ? ?CHIEF COMPLAINT ?Patient presents for Retina Follow Up ? ? ?HISTORY OF PRESENT ILLNESS: ?Jennifer Rose is a 66 y.o. female who presents to the clinic today for:  ? ?HPI   ? ? Retina Follow Up   ?Patient presents with  Other.  In right eye.  This started 4 weeks ago.  I, the attending physician,  performed the HPI with the patient and updated documentation appropriately. ? ?  ?  ? ? Comments   ?Patient here for 4 weeks retina follow up for optic disc edema OD. Patient states vision doing pretty good. No eye pain. Started prednisone on 12-29-21 ('20mg'$  BID). Got dizzy last week. Was on too much prednisone.   Now taking '10mg'$  BID per Dr. Melissa Noon instructions.  ? ?  ?  ?Last edited by Bernarda Caffey, MD on 01/09/2022  9:22 AM.  ?  ?Pt was started on '40mg'$  po prednisone ('20mg'$  BID) by Dr. Amil Amen on March 10th, pt states this dosage was too high and made her dizzy. Pt spoke with Dr. Amil Amen who told her to decrease medication down to '20mg'$  in the morning only. Pt taking '10mg'$  BID to split up the dose. ? ?Referring physician: ?Arnoldo Hooker MD ?[address] ?[place], [place] [address] ? ?HISTORICAL INFORMATION:  ? ?Selected notes from the [place] ?Referred by Dr. Lucianne Lei for papilledema OD, on plaquenil for lupus ?LEE: 11/28/2021 BCVA OD: 20/30 OS: 20/20 ?Ocular Hx- papilledema, glaucoma suspect ?PMH- lupus,  ?  ? ?CURRENT MEDICATIONS: ?Current Outpatient Medications (Ophthalmic Drugs)  ?Medication Sig  ? latanoprost (XALATAN) 0.005 % ophthalmic solution Place 1 drop into both eyes as directed.  ? ?No current facility-administered medications for this visit. (Ophthalmic Drugs)  ? ?Current Outpatient Medications (Other)  ?Medication Sig  ? amLODipine (NORVASC) 5 MG tablet TAKE 1 TABLET BY MOUTH ONCE A DAY  ? cholecalciferol (VITAMIN D) 1000 UNITS tablet Take 2,000 Units by mouth daily.  ? fexofenadine (ALLEGRA) 180 MG tablet Take 180 mg by  mouth daily as needed for allergies or rhinitis.  ? fluticasone (FLONASE) 50 MCG/ACT nasal spray Place 1-2 sprays into both nostrils daily.  ? hydroxychloroquine (PLAQUENIL) 200 MG tablet Take 200 mg by mouth 2 (two) times daily.  ? losartan-hydrochlorothiazide (HYZAAR) 50-12.5 MG tablet TAKE 1 TABLET BY MOUTH ONCE A DAY  ? meclizine (ANTIVERT) 12.5 MG tablet Take 12.5 mg by mouth 3 (three) times daily as needed.  ? potassium chloride (KLOR-CON) 10 MEQ tablet Take 2 tablets (20 mEq total) by mouth daily.  ? predniSONE (DELTASONE) 5 MG tablet Take 5 mg by mouth daily as needed.  ? ?No current facility-administered medications for this visit. (Other)  ? ?REVIEW OF SYSTEMS: ?ROS   ?Positive for: Endocrine, Cardiovascular, Eyes, Allergic/Imm ?Negative for: Constitutional, Gastrointestinal, Neurological, Skin, Genitourinary, Musculoskeletal, HENT, Respiratory, Psychiatric, Heme/Lymph ?Last edited by Theodore Demark, COA on 01/09/2022  8:39 AM.  ?  ? ?ALLERGIES ?No Known Allergies ? ?PAST MEDICAL HISTORY ?Past Medical History:  ?Diagnosis Date  ? Allergy   ? allergic rhinitis  ? Hypertension 03/22/2004  ? Lupus ([place])   ? Uterine fibroid 11/01  ? per GYN  ? ?Past Surgical History:  ?Procedure Laterality Date  ? BREAST CYST EXCISION Right 36 yrs ago  ? x2  ? ccy    ? ENDOMETRIAL ABLATION  04/03  ? polyps?DUB  ? ?FAMILY HISTORY ?Family History  ?Problem Relation Age  of Onset  ? Blindness Mother   ? Stroke Mother   ? Diabetes Mother   ? Glaucoma Mother   ? Glaucoma Father   ? ?SOCIAL HISTORY ?Social History  ? ?Tobacco Use  ? Smoking status: Never  ? Smokeless tobacco: Never  ?Vaping Use  ? Vaping Use: Never used  ?Substance Use Topics  ? Alcohol use: No  ?  Alcohol/week: 0.0 standard drinks  ? Drug use: No  ?  ? ?  ?OPHTHALMIC EXAM: ? ?Base Eye Exam   ? ? Visual Acuity (Snellen - Linear)   ? ?   Right Left  ? Dist cc 20/20 20/20  ? ? Correction: Glasses  ? ?  ?  ? ? Tonometry (Tonopen, 8:36 AM)   ? ?   Right Left  ?  Pressure 18 21  ? ?  ?  ? ? Pupils   ? ?   Dark Light Shape React APD  ? Right 3 2 Round Brisk None  ? Left 3 2 Round Brisk None  ? ?  ?  ? ? Visual Fields (Counting fingers)   ? ?   Left Right  ?  Full Full  ? ?  ?  ? ? Extraocular Movement   ? ?   Right Left  ?  Full, Ortho Full, Ortho  ? ?  ?  ? ? Neuro/Psych   ? ? Oriented x3: Yes  ? Mood/Affect: Normal  ? ?  ?  ? ? Dilation   ? ? Both eyes: 1.0% Mydriacyl, 2.5% Phenylephrine @ 8:36 AM  ? ?  ?  ? ?  ? ?Slit Lamp and Fundus Exam   ? ? Slit Lamp Exam   ? ?   Right Left  ? Lids/Lashes Dermatochalasis - upper lid Dermatochalasis - upper lid  ? Conjunctiva/Sclera mild melanosis mild melanosis  ? Cornea mild arcus, trace, fine endo pigment Clear  ? Anterior Chamber Deep and quiet Deep and quiet  ? Iris Round and dilated, nevus at 0900 Round and dilated, nevus at 0900  ? Lens 2+ Nuclear sclerosis, 2+ Cortical cataract 2+ Nuclear sclerosis, 2+ Cortical cataract  ? Anterior Vitreous Vitreous syneresis, no cell or pigment Vitreous syneresis, no cell or pigment  ? ?  ?  ? ? Fundus Exam   ? ?   Right Left  ? Disc +disc edema with CWS superior rim - CWS and heme improving, disc edema overall decreased, +obscuration of vessels, +heme Pink and Sharp, focal PPP temporal  ? C/D Ratio 0.1 0.1  ? Macula Flat, Good foveal reflex, scattered IRH Flat, Good foveal reflex, scattered, punctate IRH  ? Vessels attenuated, Tortuous, dilation of temporal venules slightly improved attenuated, Tortuous  ? Periphery Attached, 360 IRH, cluster of larger DBH temporal periphery Attached, scattered MA  ? ?  ?  ? ?  ? ?Refraction   ? ? Wearing Rx   ? ?   Sphere Cylinder Axis Add  ? Right -1.50 +0.25 038 +2.75  ? Left -0.75 +0.25 175 +2.75  ? ?  ?  ? ?  ? ?IMAGING AND PROCEDURES  ?Imaging and Procedures for 01/09/2022 ? ?OCT, Retina - OU - Both Eyes   ? ?   ?Right Eye ?Quality was good. Central Foveal Thickness: 287. Progression has improved. Findings include normal foveal contour, no IRF, no SRF  (interval improvement in disc edema).  ? ?Left Eye ?Quality was good. Central Foveal Thickness: 255. Progression has been stable. Findings include normal  foveal contour, no IRF, no SRF (Partial PVD, no ellipsoid loss).  ? ?Notes ?*Images captured and stored on drive ? ?Diagnosis / Impression:  ?OD: interval improvement in disc edema; NFP, no IRF/SRF ?OS: NFP, no IRF/SRF ?No ellipsoid loss OU -- no Plaquenil toxicity OU ?No DME ? ?Clinical management:  ?See below ? ?Abbreviations: NFP - Normal foveal profile. CME - cystoid macular edema. PED - pigment epithelial detachment. IRF - intraretinal fluid. SRF - subretinal fluid. EZ - ellipsoid zone. ERM - epiretinal membrane. ORA - outer retinal atrophy. ORT - outer retinal tubulation. SRHM - subretinal hyper-reflective material. IRHM - intraretinal hyper-reflective material ? ? ?  ? ?  ?  ? ?  ?ASSESSMENT/PLAN: ? ?  ICD-10-CM   ?1. Optic disc edema  H47.10   ?  ?2. Systemic lupus erythematosus with other organ involvement, unspecified SLE type ([place])  M32.19   ?  ?3. Long-term use of Plaquenil  Z79.899   ?  ?4. 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 2 diabetes mellitus ([place])  B93.9030 OCT, Retina - OU - Both Eyes  ?  ?5. Essential hypertension  I10   ?  ?6. Hypertensive retinopathy of both eyes  H35.033   ?  ?7. Combined forms of age-related cataract of both eyes  H25.813   ?  ? ? ?1. Optic disc edema OD ? - exam shows +CWS on disc rim, obscuration of disc vessels, and +hemorrhages ? - BCVA remains 20/20, no rAPD ?- first noted by Dr. Lucianne Lei on routine exam, pt asymptomatic ?- CT brain and orbits w and w/o contrastordered by Dr. Lucianne Lei and imaged 2.16.23 -- no retrobulbar or retro-orbital mass noted ?- FA 2.21.23 shows hyperfluorescent staining and leakage of disc OD ?- agree with Dr. 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 Amil Amen started pt on '40mg'$  po Prednisone ('20mg'$  BID) on March  10th ?- pt developed dizziness and medication was decreased to '20mg'$  in the morning -- pt taking '10mg'$  BID ?- exam and OCT today shows interval improvement in disc edema and vascular congestion ?- will send Dr.

## 2022-01-09 ENCOUNTER — Encounter (INDEPENDENT_AMBULATORY_CARE_PROVIDER_SITE_OTHER): Payer: Self-pay | Admitting: Ophthalmology

## 2022-01-09 ENCOUNTER — Other Ambulatory Visit: Payer: Self-pay

## 2022-01-09 ENCOUNTER — Ambulatory Visit (INDEPENDENT_AMBULATORY_CARE_PROVIDER_SITE_OTHER): Payer: BC Managed Care – PPO | Admitting: Ophthalmology

## 2022-01-09 DIAGNOSIS — H25813 Combined forms of age-related cataract, bilateral: Secondary | ICD-10-CM

## 2022-01-09 DIAGNOSIS — H4710 Unspecified papilledema: Secondary | ICD-10-CM | POA: Diagnosis not present

## 2022-01-09 DIAGNOSIS — E113293 Type 2 diabetes mellitus with mild nonproliferative diabetic retinopathy without macular edema, bilateral: Secondary | ICD-10-CM | POA: Diagnosis not present

## 2022-01-09 DIAGNOSIS — M3219 Other organ or system involvement in systemic lupus erythematosus: Secondary | ICD-10-CM | POA: Diagnosis not present

## 2022-01-09 DIAGNOSIS — I10 Essential (primary) hypertension: Secondary | ICD-10-CM

## 2022-01-09 DIAGNOSIS — H35033 Hypertensive retinopathy, bilateral: Secondary | ICD-10-CM | POA: Diagnosis not present

## 2022-01-09 DIAGNOSIS — Z79899 Other long term (current) drug therapy: Secondary | ICD-10-CM

## 2022-01-11 ENCOUNTER — Ambulatory Visit: Payer: BC Managed Care – PPO | Admitting: Family Medicine

## 2022-01-11 ENCOUNTER — Other Ambulatory Visit: Payer: Self-pay

## 2022-01-11 ENCOUNTER — Encounter: Payer: Self-pay | Admitting: Family Medicine

## 2022-01-11 VITALS — BP 130/84 | HR 80 | Temp 97.5°F | Ht 64.5 in | Wt 214.0 lb

## 2022-01-11 DIAGNOSIS — J029 Acute pharyngitis, unspecified: Secondary | ICD-10-CM | POA: Diagnosis not present

## 2022-01-11 DIAGNOSIS — J069 Acute upper respiratory infection, unspecified: Secondary | ICD-10-CM | POA: Diagnosis not present

## 2022-01-11 LAB — POCT RAPID STREP A (OFFICE): Rapid Strep A Screen: NEGATIVE

## 2022-01-11 MED ORDER — BENZONATATE 200 MG PO CAPS: 200.0000 mg | ORAL_CAPSULE | Freq: Three times a day (TID) | ORAL | 1 refills | Status: DC | PRN

## 2022-01-11 NOTE — Assessment & Plan Note (Signed)
With ST and hoarseness, rapid strep neg and covid neg ?Reassuring exam  ?Discussed symptom control ?She is already on prednisone for lupus currently ?Recommended Tylenol and/or ibuprofen for pain, salt water gargles, Delsym for cough ?Also prescribed Tessalon for cough to use as needed ?ER precautions reviewed ?Update if not starting to improve in a week or if worsening  (esp sinus pain/fever or wheezing) ?

## 2022-01-11 NOTE — Patient Instructions (Addendum)
Watch for sinus pain, fever and colored mucous  ?Watch for worse cough or wheezing  ? ?The prednisone helps  ?Drink fluids  ?Salt water gargle helps throat  ?Chloraseptic products are good  ? ? ?Tylenol and ibuprofen are the best for sore throat  ?Rest your voice  ? ?Update if not starting to improve in a week or if worsening   ? ?Delsym over the counter is great for cough as directed  ?Try tessalon for cough also  ?

## 2022-01-11 NOTE — Progress Notes (Signed)
? ?Subjective:  ? ? Patient ID: Jennifer Rose, female    DOB: June 13, 1956, 66 y.o.   MRN: [id number] ? ?This visit occurred during the SARS-CoV-2 public health emergency.  Safety protocols were in place, including screening questions prior to the visit, additional usage of staff PPE, and extensive cleaning of exam room while observing appropriate contact time as indicated for disinfecting solutions.  ? ?HPI ?Pt presents with sinus symptoms  ? ?Wt Readings from Last 3 Encounters:  ?01/11/22 214 lb (97.1 kg)  ?11/16/21 215 lb 8 oz (97.8 kg)  ?07/13/21 220 lb 2 oz (99.8 kg)  ? ?36.17 kg/m? ? ?A week ago -st and then drainage  ?Ear ache in both ears  ?Nasal congestion  ? ?Cough -little productive  ?Clear mucous  ?No wheezing ?A little tight    (is on prednisone for lupus - 10 mg bid) then dec to 10 mg in am only  ?Prednisone makes her dizzy  ?Hoarse voice  ? ?No fever  ? ?2 neg covid tests  ? ?Flonase ?Store brand allergy pill/? Brand  ? ? ?Lupus has been worse lately -some oph complications  ? ?Results for orders placed or performed in visit on 01/11/22  ?POCT rapid strep A  ?Result Value Ref Range  ? Rapid Strep A Screen Negative Negative  ?  ?Patient Active Problem List  ? Diagnosis Date Noted  ? Sore throat 01/11/2022  ? Colon cancer screening 11/12/2019  ? H/O migraine 08/26/2018  ? Routine general medical examination at a health care facility 08/26/2018  ? Hypokalemia 08/26/2018  ? Thrombocytopenia ([place]) 04/30/2017  ? Prediabetes 12/13/2014  ? 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 GLAUCOMA 04/15/2009  ? LUPUS 11/09/2008  ? Viral URI with cough 09/24/2007  ? Allergic rhinitis 09/24/2007  ? Essential hypertension 03/22/2004  ? ?Past Medical History:  ?Diagnosis Date  ? Allergy   ? allergic rhinitis  ? Hypertension 03/22/2004  ? Lupus ([place])   ? Uterine fibroid 11/01  ? per GYN  ? ?Past Surgical History:  ?Procedure Laterality Date  ?  BREAST CYST EXCISION Right 36 yrs ago  ? x2  ? [facility]    ? ENDOMETRIAL ABLATION  04/03  ? polyps?DUB  ? ?Social History  ? ?Tobacco Use  ? Smoking status: Never  ? Smokeless tobacco: Never  ?Vaping Use  ? Vaping Use: Never used  ?Substance Use Topics  ? Alcohol use: No  ?  Alcohol/week: 0.0 standard drinks  ? Drug use: No  ? ?Family History  ?Problem Relation Age of Onset  ? Blindness Mother   ? Stroke Mother   ? Diabetes Mother   ? Glaucoma Mother   ? Glaucoma Father   ? ?No Known Allergies ?Current Outpatient Medications on File Prior to Visit  ?Medication Sig Dispense Refill  ? amLODipine (NORVASC) 5 MG tablet TAKE 1 TABLET BY MOUTH ONCE A DAY 90 tablet 0  ? cholecalciferol (VITAMIN D) 1000 UNITS tablet Take 2,000 Units by mouth daily.    ? fexofenadine (ALLEGRA) 180 MG tablet Take 180 mg by mouth daily as needed for allergies or rhinitis.    ? fluticasone (FLONASE) 50 MCG/ACT nasal spray Place 1-2 sprays into both nostrils daily. 16 g 5  ? hydroxychloroquine (PLAQUENIL) 200 MG tablet Take 200 mg by mouth 2 (two) times daily.    ? latanoprost (XALATAN) 0.005 % ophthalmic solution Place 1 drop into both  eyes as directed.    ? losartan-hydrochlorothiazide (HYZAAR) 50-12.5 MG tablet TAKE 1 TABLET BY MOUTH ONCE A DAY 90 tablet 0  ? meclizine (ANTIVERT) 12.5 MG tablet Take 12.5 mg by mouth 3 (three) times daily as needed.    ? potassium chloride (KLOR-CON) 10 MEQ tablet Take 2 tablets (20 mEq total) by mouth daily. 180 tablet 3  ? predniSONE (DELTASONE) 10 MG tablet Take 10 mg by mouth daily.    ? ?No current facility-administered medications on file prior to visit.  ?  ?Review of Systems  ?Constitutional:  Positive for appetite change and fatigue. Negative for fever.  ?HENT:  Positive for congestion, postnasal drip, rhinorrhea, sinus pressure, sneezing and sore throat. Negative for ear pain.   ?     Very mild nasal symptoms   ?Eyes:  Negative for pain and discharge.  ?Respiratory:  Positive for cough. Negative for  shortness of breath, wheezing and stridor.   ?Cardiovascular:  Negative for chest pain.  ?Gastrointestinal:  Negative for diarrhea, nausea and vomiting.  ?Genitourinary:  Negative for frequency, hematuria and urgency.  ?Musculoskeletal:  Negative for arthralgias and myalgias.  ?Skin:  Negative for rash.  ?Neurological:  Positive for headaches. Negative for dizziness, weakness and light-headedness.  ?Psychiatric/Behavioral:  Negative for confusion and dysphoric mood.   ? ?   ?Objective:  ? Physical Exam ?Constitutional:   ?   General: She is not in acute distress. ?   Appearance: Normal appearance. She is well-developed. She is obese. She is not ill-appearing, toxic-appearing or diaphoretic.  ?HENT:  ?   Head: Normocephalic and atraumatic.  ?   Comments: Nares are injected and congested   ?   Right Ear: Tympanic membrane, ear canal and external ear normal. Tympanic membrane is not erythematous.  ?   Left Ear: Tympanic membrane, ear canal and external ear normal. Tympanic membrane is not erythematous.  ?   Nose: Congestion and rhinorrhea present.  ?   Comments: Mild congestion  ? ?Very hoarse vice  ?   Mouth/Throat:  ?   Mouth: Mucous membranes are moist.  ?   Pharynx: Oropharynx is clear. No oropharyngeal exudate or posterior oropharyngeal erythema.  ?   Comments: Clear pnd  ?No erythema  ?Eyes:  ?   General:     ?   Right eye: No discharge.     ?   Left eye: No discharge.  ?   Conjunctiva/sclera: Conjunctivae normal.  ?   Pupils: Pupils are equal, round, and reactive to light.  ?Cardiovascular:  ?   Rate and Rhythm: Normal rate and regular rhythm.  ?   Heart sounds: Normal heart sounds.  ?Pulmonary:  ?   Effort: Pulmonary effort is normal. No respiratory distress.  ?   Breath sounds: Normal breath sounds. No stridor. No wheezing, rhonchi or rales.  ?   Comments: Good air exch ?Chest:  ?   Chest wall: No tenderness.  ?Musculoskeletal:  ?   Cervical back: Normal range of motion and neck supple. No tenderness.   ?Lymphadenopathy:  ?   Cervical: No cervical adenopathy.  ?Skin: ?   General: Skin is warm and dry.  ?   Capillary Refill: Capillary refill takes less than 2 seconds.  ?   Findings: No rash.  ?Neurological:  ?   Mental Status: She is alert.  ?   Cranial Nerves: No cranial nerve deficit.  ?Psychiatric:     ?   Mood and Affect: Mood normal.  ? ? ? ? ? ?   ?  Assessment & Plan:  ? ?Problem List Items Addressed This Visit   ? ?  ? Respiratory  ? Viral URI with cough - Primary  ?  With ST and hoarseness, rapid strep neg and covid neg ?Reassuring exam  ?Discussed symptom control ?She is already on prednisone for lupus currently ?Recommended Tylenol and/or ibuprofen for pain, salt water gargles, Delsym for cough ?Also prescribed Tessalon for cough to use as needed ?ER precautions reviewed ?Update if not starting to improve in a week or if worsening  (esp sinus pain/fever or wheezing) ?  ?  ?  ? Other  ? Sore throat  ? Relevant Orders  ? POCT rapid strep A (Completed)  ? ? ? ?

## 2022-01-13 ENCOUNTER — Encounter: Payer: Self-pay | Admitting: Family Medicine

## 2022-01-15 ENCOUNTER — Ambulatory Visit (INDEPENDENT_AMBULATORY_CARE_PROVIDER_SITE_OTHER): Payer: BC Managed Care – PPO | Admitting: Family Medicine

## 2022-01-15 ENCOUNTER — Encounter: Payer: Self-pay | Admitting: Family Medicine

## 2022-01-15 ENCOUNTER — Other Ambulatory Visit: Payer: Self-pay

## 2022-01-15 VITALS — BP 126/70 | HR 100 | Temp 97.9°F | Ht 64.5 in | Wt 209.2 lb

## 2022-01-15 DIAGNOSIS — J0110 Acute frontal sinusitis, unspecified: Secondary | ICD-10-CM

## 2022-01-15 MED ORDER — AMOXICILLIN-POT CLAVULANATE 875-125 MG PO TABS: 1.0000 | ORAL_TABLET | Freq: Two times a day (BID) | ORAL | 0 refills | Status: DC

## 2022-01-15 NOTE — Assessment & Plan Note (Signed)
S/p 1-2 wk of uri and laryngitis  ?With sinus tenderness both maxillary and frontal along with purulent mucus drainage ?Still taking prednisone for her lupus which helps with congestion ?Prescribed Augmentin for bacterial sinusitis today for 1 week ?Discussed symptom care including nasal saline irrigations ?ER precautions discussed ?Update if not starting to improve in a week or if worsening   ?

## 2022-01-15 NOTE — Progress Notes (Signed)
? ?Subjective:  ? ? Patient ID: Jennifer Rose, female    DOB: Apr 02, 1956, 66 y.o.   MRN: [id number] ? ?This visit occurred during the SARS-CoV-2 public health emergency.  Safety protocols were in place, including screening questions prior to the visit, additional usage of staff PPE, and extensive cleaning of exam room while observing appropriate contact time as indicated for disinfecting solutions.  ? ?HPI ?Pt presents with sinus pain/ears and cheeks ? ?Wt Readings from Last 3 Encounters:  ?01/15/22 209 lb 4 oz (94.9 kg)  ?01/11/22 214 lb (97.1 kg)  ?11/16/21 215 lb 8 oz (97.8 kg)  ? ?35.36 kg/m? ? ? ?She was seen on 3/23 ?Dx with uri/cough and laryngitis  (negative strep test) ?Taking prednisone for lupus  ?Px tessalon ? ?Sunday - mucous turned yellow and green  ?Ears hurt -worse on right  ?Headache today  ?Cheeks hurt  ? ?Voice is a little better  ?No wheezing  ? ?Patient Active Problem List  ? Diagnosis Date Noted  ? Sore throat 01/11/2022  ? Colon cancer screening 11/12/2019  ? Acute sinusitis 02/16/2019  ? H/O migraine 08/26/2018  ? Routine general medical examination at a health care facility 08/26/2018  ? Hypokalemia 08/26/2018  ? Thrombocytopenia ([place]) 04/30/2017  ? Prediabetes 12/13/2014  ? 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 GLAUCOMA 04/15/2009  ? LUPUS 11/09/2008  ? Viral URI with cough 09/24/2007  ? Allergic rhinitis 09/24/2007  ? Essential hypertension 03/22/2004  ? ?Past Medical History:  ?Diagnosis Date  ? Allergy   ? allergic rhinitis  ? Hypertension 03/22/2004  ? Lupus ([place])   ? Uterine fibroid 11/01  ? per GYN  ? ?Past Surgical History:  ?Procedure Laterality Date  ? BREAST CYST EXCISION Right 36 yrs ago  ? x2  ? [facility]    ? ENDOMETRIAL ABLATION  04/03  ? polyps?DUB  ? ?Social History  ? ?Tobacco Use  ? Smoking status: Never  ? Smokeless tobacco: Never  ?Vaping Use  ? Vaping Use: Never used  ?Substance Use Topics  ?  Alcohol use: No  ?  Alcohol/week: 0.0 standard drinks  ? Drug use: No  ? ?Family History  ?Problem Relation Age of Onset  ? Blindness Mother   ? Stroke Mother   ? Diabetes Mother   ? Glaucoma Mother   ? Glaucoma Father   ? ?No Known Allergies ?Current Outpatient Medications on File Prior to Visit  ?Medication Sig Dispense Refill  ? amLODipine (NORVASC) 5 MG tablet TAKE 1 TABLET BY MOUTH ONCE A DAY 90 tablet 0  ? benzonatate (TESSALON) 200 MG capsule Take 1 capsule (200 mg total) by mouth 3 (three) times daily as needed for cough. Swallow whole 30 capsule 1  ? cholecalciferol (VITAMIN D) 1000 UNITS tablet Take 2,000 Units by mouth daily.    ? fexofenadine (ALLEGRA) 180 MG tablet Take 180 mg by mouth daily as needed for allergies or rhinitis.    ? fluticasone (FLONASE) 50 MCG/ACT nasal spray Place 1-2 sprays into both nostrils daily. 16 g 5  ? hydroxychloroquine (PLAQUENIL) 200 MG tablet Take 200 mg by mouth 2 (two) times daily.    ? latanoprost (XALATAN) 0.005 % ophthalmic solution Place 1 drop into both eyes as directed.    ? losartan-hydrochlorothiazide (HYZAAR) 50-12.5 MG tablet TAKE 1 TABLET BY MOUTH ONCE A DAY 90 tablet 0  ? meclizine (ANTIVERT) 12.5 MG tablet Take 12.5  mg by mouth 3 (three) times daily as needed.    ? potassium chloride (KLOR-CON) 10 MEQ tablet Take 2 tablets (20 mEq total) by mouth daily. 180 tablet 3  ? predniSONE (DELTASONE) 10 MG tablet Take 10 mg by mouth daily.    ? ?No current facility-administered medications on file prior to visit.  ?  ? ?Review of Systems  ?Constitutional:  Positive for appetite change. Negative for fatigue and fever.  ?HENT:  Positive for congestion, ear pain, postnasal drip, rhinorrhea, sinus pressure, sore throat and voice change. Negative for nosebleeds.   ?Eyes:  Negative for pain, redness and itching.  ?Respiratory:  Positive for cough. Negative for shortness of breath and wheezing.   ?Cardiovascular:  Negative for chest pain.  ?Gastrointestinal:  Negative for  abdominal pain, diarrhea, nausea and vomiting.  ?Endocrine: Negative for polyuria.  ?Genitourinary:  Negative for dysuria, frequency and urgency.  ?Musculoskeletal:  Negative for arthralgias and myalgias.  ?Allergic/Immunologic: Negative for immunocompromised state.  ?Neurological:  Positive for headaches. Negative for dizziness, tremors, syncope, weakness and numbness.  ?Hematological:  Negative for adenopathy. Does not bruise/bleed easily.  ?Psychiatric/Behavioral:  Negative for dysphoric mood. The patient is not nervous/anxious.   ? ?   ?Objective:  ? Physical Exam ?Constitutional:   ?   General: She is not in acute distress. ?   Appearance: Normal appearance. She is well-developed. She is obese. She is not ill-appearing.  ?HENT:  ?   Head: Normocephalic and atraumatic.  ?   Comments: Bilateral maxillary and frontal sinus tenderness ?   Right Ear: Tympanic membrane and external ear normal.  ?   Left Ear: Tympanic membrane and external ear normal.  ?   Ears:  ?   Comments: Dull TMs ?   Nose: Congestion and rhinorrhea present.  ?   Mouth/Throat:  ?   Pharynx: Oropharynx is clear. No oropharyngeal exudate or posterior oropharyngeal erythema.  ?   Comments: Clear pnd ?Eyes:  ?   General:     ?   Right eye: No discharge.     ?   Left eye: No discharge.  ?   Conjunctiva/sclera: Conjunctivae normal.  ?   Pupils: Pupils are equal, round, and reactive to light.  ?Cardiovascular:  ?   Rate and Rhythm: Regular rhythm. Tachycardia present.  ?Pulmonary:  ?   Effort: Pulmonary effort is normal. No respiratory distress.  ?   Breath sounds: Normal breath sounds. No wheezing or rales.  ?   Comments: Good air exch ?No rales or rhonchi ?Musculoskeletal:  ?   Cervical back: Normal range of motion and neck supple.  ?Lymphadenopathy:  ?   Cervical: No cervical adenopathy.  ?Skin: ?   General: Skin is warm and dry.  ?   Findings: No rash.  ?Neurological:  ?   Mental Status: She is alert.  ?   Cranial Nerves: No cranial nerve deficit.   ?   Coordination: Coordination normal.  ?Psychiatric:     ?   Mood and Affect: Mood normal.  ? ? ? ? ? ?   ?Assessment & Plan:  ? ?Problem List Items Addressed This Visit   ? ?  ? Respiratory  ? Acute sinusitis - Primary  ?  S/p 1-2 wk of uri and laryngitis  ?With sinus tenderness both maxillary and frontal along with purulent mucus drainage ?Still taking prednisone for her lupus which helps with congestion ?Prescribed Augmentin for bacterial sinusitis today for 1 week ?Discussed symptom care including nasal  saline irrigations ?ER precautions discussed ?Update if not starting to improve in a week or if worsening   ?  ?  ? Relevant Medications  ? amoxicillin-clavulanate (AUGMENTIN) 875-125 MG tablet  ? ? ?

## 2022-01-15 NOTE — Patient Instructions (Signed)
Continue flonase  ? ?Also get some saline - use as needed to irrigate sinuses  ? ?Breathe steam  ?Drink lots of fluids ? ?Take augmentin as directed  ?Update if not starting to improve in a week or if worsening   ? ? ?

## 2022-01-22 ENCOUNTER — Encounter: Payer: Self-pay | Admitting: Family Medicine

## 2022-01-22 ENCOUNTER — Ambulatory Visit (INDEPENDENT_AMBULATORY_CARE_PROVIDER_SITE_OTHER): Payer: BC Managed Care – PPO | Admitting: Family Medicine

## 2022-01-22 VITALS — BP 112/68 | HR 93 | Temp 98.8°F | Ht 64.5 in | Wt 204.6 lb

## 2022-01-22 DIAGNOSIS — R066 Hiccough: Secondary | ICD-10-CM

## 2022-01-22 MED ORDER — BACLOFEN 10 MG PO TABS: 5.0000 mg | ORAL_TABLET | Freq: Three times a day (TID) | ORAL | 0 refills | Status: DC

## 2022-01-22 NOTE — Progress Notes (Signed)
? ? ?Rosell Khouri T. Avabella Wailes, MD, [facility] ?Therapist, music at [facility] ?[place] ?[place], [address] ? ?Phone: [phone]  FAX: [phone] ? ?Jennifer Rose - 66 y.o. female  MRN [id number]  Date of Birth: Feb 28, 1956 ? ?Date: 01/22/2022  PCP: Abner Greenspan, MD  Referral: Abner Greenspan, MD ? ?Chief Complaint  ?Patient presents with  ? Follow-up  ?  Viral Infection-Seen Dr. Glori Bickers on 3/23 and 3/27  ? Hiccups  ?  Since Friday after taking Mucinex-unable to keep any medications down because the hiccups make her throw it  back up  ? ? ?This visit occurred during the SARS-CoV-2 public health emergency.  Safety protocols were in place, including screening questions prior to the visit, additional usage of staff PPE, and extensive cleaning of exam room while observing appropriate contact time as indicated for disinfecting solutions.  ? ?Subjective:  ? ?Jennifer Rose is a 66 y.o. very pleasant female patient with Body mass index is 34.57 kg/m?. who presents with the following: ? ?Friday afternoon, took some Mucinex, got hiccups since Friday.  She has had a viral type syndrome over the last few weeks, she recently was seen by Dr. Glori Bickers on 27 March.  At that point it was felt that she had some from sinusitis, she was placed on some Augmentin. ? ?Since Friday she has had continued hiccups.  She has tried virtually everything forward traditional hiccup resolution such as holding breath, positional changes, drinking liquids, swallowing sugar, and other things. ? ?Tried all normal remedies ? ?Review of Systems is noted in the HPI, as appropriate ? ?Objective:  ? ?BP 112/68   Pulse 93   Temp 98.8 ?F (37.1 ?C) (Oral)   Ht 5' 4.5" (1.638 m)   Wt 204 lb 9 oz (92.8 kg)   SpO2 100%   BMI 34.57 kg/m?  ? ?Gen: WDWN, NAD. Globally Non-toxic ?HEENT: Throat clear, w/o exudate, R TM clear, L TM - good landmarks, No fluid present. rhinnorhea.  MMM ?Frontal sinuses: NT ?Max sinuses: NT ?NECK:  Anterior cervical  LAD is absent ?CV: RRR, No M/G/R, cap refill <2 sec ?PULM: Breathing comfortably in no respiratory distress. no wheezing, crackles, rhonchi  ? ?Laboratory and Imaging Data: ? ?Assessment and Plan:  ? ?  ICD-10-CM   ?1. Intractable hiccups  R06.6   ?  ? ?Up-to-date reviewed.  We will try 3 times daily baclofen, titrate up dose if needed.  Additional medication might be needed, but recommended she continue through Friday or Saturday. ? ?Patient Instructions  ?Baclofen dosing:  1/2 a tablet three times a day. ? ?- If this does not help, after the 3rd day, increase to a full tablet. (10 mg) three times a day.  ? ?Meds ordered this encounter  ?Medications  ? baclofen (LIORESAL) 10 MG tablet  ?  Sig: Take 0.5-1 tablets (5-10 mg total) by mouth 3 (three) times daily.  ?  Dispense:  60 tablet  ?  Refill:  0  ? ?There are no discontinued medications. ?No orders of the defined types were placed in this encounter. ? ? ?Follow-up: No follow-ups on file. ? ?Dragon [employer] One speech-to-text software was used for transcription in this dictation.  Possible transcriptional errors can occur using Editor, commissioning.  ? ?Signed, ? ?Kegan Shepardson T. Shaquasia Caponigro, MD ? ? ?Outpatient Encounter Medications as of 01/22/2022  ?Medication Sig  ? baclofen (LIORESAL) 10 MG tablet Take 0.5-1 tablets (5-10 mg total) by mouth 3 (three)  times daily.  ? amLODipine (NORVASC) 5 MG tablet TAKE 1 TABLET BY MOUTH ONCE A DAY  ? amoxicillin-clavulanate (AUGMENTIN) 875-125 MG tablet Take 1 tablet by mouth 2 (two) times daily.  ? benzonatate (TESSALON) 200 MG capsule Take 1 capsule (200 mg total) by mouth 3 (three) times daily as needed for cough. Swallow whole  ? cholecalciferol (VITAMIN D) 1000 UNITS tablet Take 2,000 Units by mouth daily.  ? fexofenadine (ALLEGRA) 180 MG tablet Take 180 mg by mouth daily as needed for allergies or rhinitis.  ? fluticasone (FLONASE) 50 MCG/ACT nasal spray Place 1-2 sprays into both nostrils daily.  ?  hydroxychloroquine (PLAQUENIL) 200 MG tablet Take 200 mg by mouth 2 (two) times daily.  ? latanoprost (XALATAN) 0.005 % ophthalmic solution Place 1 drop into both eyes as directed.  ? losartan-hydrochlorothiazide (HYZAAR) 50-12.5 MG tablet TAKE 1 TABLET BY MOUTH ONCE A DAY  ? meclizine (ANTIVERT) 12.5 MG tablet Take 12.5 mg by mouth 3 (three) times daily as needed.  ? potassium chloride (KLOR-CON) 10 MEQ tablet Take 2 tablets (20 mEq total) by mouth daily.  ? predniSONE (DELTASONE) 10 MG tablet Take 10 mg by mouth daily.  ? ?No facility-administered encounter medications on file as of 01/22/2022.  ?  ?

## 2022-01-22 NOTE — Patient Instructions (Signed)
Baclofen dosing:  1/2 a tablet three times a day. ? ?- If this does not help, after the 3rd day, increase to a full tablet. (10 mg) three times a day. ?

## 2022-01-24 ENCOUNTER — Ambulatory Visit: Payer: BC Managed Care – PPO | Admitting: Family Medicine

## 2022-01-30 ENCOUNTER — Ambulatory Visit: Admission: RE | Admit: 2022-01-30 | Payer: BC Managed Care – PPO | Source: Ambulatory Visit

## 2022-01-30 ENCOUNTER — Encounter: Payer: Self-pay | Admitting: Family Medicine

## 2022-01-30 ENCOUNTER — Ambulatory Visit: Payer: BC Managed Care – PPO | Admitting: Family Medicine

## 2022-01-30 VITALS — BP 130/80 | HR 86 | Temp 97.2°F | Ht 64.5 in | Wt 206.1 lb

## 2022-01-30 DIAGNOSIS — J069 Acute upper respiratory infection, unspecified: Secondary | ICD-10-CM | POA: Diagnosis not present

## 2022-01-30 DIAGNOSIS — R066 Hiccough: Secondary | ICD-10-CM | POA: Diagnosis not present

## 2022-01-30 NOTE — Assessment & Plan Note (Signed)
Severe hiccups-now resolved after sucking on lemons  ?? If side eff of mucinex ?Asked pt to check label of that drug and let us know what other ingredients it had  ? ? ?

## 2022-01-30 NOTE — Progress Notes (Signed)
? ?Subjective:  ? ? Patient ID: Jennifer Rose, female    DOB: 1956-03-28, 66 y.o.   MRN: [id number] ? ?HPI ?Pt presents for f/u of hiccups (finally resolved) ? ?Wt Readings from Last 3 Encounters:  ?01/30/22 206 lb 2 oz (93.5 kg)  ?01/22/22 204 lb 9 oz (92.8 kg)  ?01/15/22 209 lb 4 oz (94.9 kg)  ? ?34.83 kg/m? ? ?Doing better ?Hiccups finally resolved ?Saw Dr Lorelei Pont on 4/3-was px muscle relaxer (baclofen) for persistent hiccups ? ?Her hiccups started after using mucinex  ?Made her throw up her medicine  ?She read about a study re: mucinex causing hiccups  ? ? ?Finally hiccups left after eating a lemon wedge  ? ?Has tessalon for cough  ?Resumed her prednisone now that she can keep it down  ? ?Had questions re: viral tests  ? ? ?Still coughing - some production/ some phlegm / no color to it  ?Sinus infection is better  ? ?No wheezing  ?No fever  ? ?Patient Active Problem List  ? Diagnosis Date Noted  ? Hiccups 01/30/2022  ? Sore throat 01/11/2022  ? Colon cancer screening 11/12/2019  ? Acute sinusitis 02/16/2019  ? H/O migraine 08/26/2018  ? Routine general medical examination at a health care facility 08/26/2018  ? Hypokalemia 08/26/2018  ? Thrombocytopenia ([place]) 04/30/2017  ? Prediabetes 12/13/2014  ? 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 GLAUCOMA 04/15/2009  ? LUPUS 11/09/2008  ? Viral URI with cough 09/24/2007  ? Allergic rhinitis 09/24/2007  ? Essential hypertension 03/22/2004  ? ?Past Medical History:  ?Diagnosis Date  ? Allergy   ? allergic rhinitis  ? Hypertension 03/22/2004  ? Lupus ([place])   ? Uterine fibroid 11/01  ? per GYN  ? ?Past Surgical History:  ?Procedure Laterality Date  ? BREAST CYST EXCISION Right 36 yrs ago  ? x2  ? [facility]    ? ENDOMETRIAL ABLATION  04/03  ? polyps?DUB  ? ?Social History  ? ?Tobacco Use  ? Smoking status: Never  ? Smokeless tobacco: Never  ?Vaping Use  ? Vaping Use: Never used  ?Substance Use Topics   ? Alcohol use: No  ?  Alcohol/week: 0.0 standard drinks  ? Drug use: No  ? ?Family History  ?Problem Relation Age of Onset  ? Blindness Mother   ? Stroke Mother   ? Diabetes Mother   ? Glaucoma Mother   ? Glaucoma Father   ? ?Allergies  ?Allergen Reactions  ? Mucinex Clear & Cool Day-Night   ?  Hiccups   ? ?Current Outpatient Medications on File Prior to Visit  ?Medication Sig Dispense Refill  ? amLODipine (NORVASC) 5 MG tablet TAKE 1 TABLET BY MOUTH ONCE A DAY 90 tablet 0  ? baclofen (LIORESAL) 10 MG tablet Take 0.5-1 tablets (5-10 mg total) by mouth 3 (three) times daily. 60 tablet 0  ? benzonatate (TESSALON) 200 MG capsule Take 1 capsule (200 mg total) by mouth 3 (three) times daily as needed for cough. Swallow whole 30 capsule 1  ? cholecalciferol (VITAMIN D) 1000 UNITS tablet Take 2,000 Units by mouth daily.    ? fexofenadine (ALLEGRA) 180 MG tablet Take 180 mg by mouth daily as needed for allergies or rhinitis.    ? fluticasone (FLONASE) 50 MCG/ACT nasal spray Place 1-2 sprays into both nostrils daily. 16 g 5  ? hydroxychloroquine (PLAQUENIL) 200 MG tablet Take 200 mg by mouth  2 (two) times daily.    ? latanoprost (XALATAN) 0.005 % ophthalmic solution Place 1 drop into both eyes as directed.    ? losartan-hydrochlorothiazide (HYZAAR) 50-12.5 MG tablet TAKE 1 TABLET BY MOUTH ONCE A DAY 90 tablet 0  ? meclizine (ANTIVERT) 12.5 MG tablet Take 12.5 mg by mouth 3 (three) times daily as needed.    ? potassium chloride (KLOR-CON) 10 MEQ tablet Take 2 tablets (20 mEq total) by mouth daily. 180 tablet 3  ? predniSONE (DELTASONE) 10 MG tablet Take 10 mg by mouth daily.    ? ?No current facility-administered medications on file prior to visit.  ?  ?Review of Systems  ?Constitutional:  Positive for fatigue. Negative for activity change, appetite change, fever and unexpected weight change.  ?HENT:  Negative for congestion, ear pain, rhinorrhea, sinus pressure and sore throat.   ?Eyes:  Negative for pain, redness and  visual disturbance.  ?Respiratory:  Negative for cough, choking, chest tightness, shortness of breath, wheezing and stridor.   ?Cardiovascular:  Negative for chest pain and palpitations.  ?Gastrointestinal:  Negative for abdominal pain, blood in stool, constipation and diarrhea.  ?Endocrine: Negative for polydipsia and polyuria.  ?Genitourinary:  Negative for dysuria, frequency and urgency.  ?Musculoskeletal:  Negative for arthralgias, back pain and myalgias.  ?Skin:  Negative for pallor and rash.  ?Allergic/Immunologic: Negative for environmental allergies.  ?Neurological:  Negative for dizziness, syncope and headaches.  ?Hematological:  Negative for adenopathy. Does not bruise/bleed easily.  ?Psychiatric/Behavioral:  Negative for decreased concentration and dysphoric mood. The patient is not nervous/anxious.   ? ?   ?Objective:  ? Physical Exam ?Constitutional:   ?   General: She is not in acute distress. ?   Appearance: Normal appearance. She is well-developed. She is obese. She is not ill-appearing or diaphoretic.  ?HENT:  ?   Head: Normocephalic and atraumatic.  ?   Right Ear: Tympanic membrane, ear canal and external ear normal.  ?   Left Ear: Tympanic membrane, ear canal and external ear normal.  ?   Nose: Rhinorrhea present.  ?   Mouth/Throat:  ?   Mouth: Mucous membranes are moist.  ?   Pharynx: Oropharynx is clear. No oropharyngeal exudate or posterior oropharyngeal erythema.  ?Eyes:  ?   General:     ?   Right eye: No discharge.     ?   Left eye: No discharge.  ?   Conjunctiva/sclera: Conjunctivae normal.  ?   Pupils: Pupils are equal, round, and reactive to light.  ?Neck:  ?   Thyroid: No thyromegaly.  ?   Vascular: No carotid bruit or JVD.  ?Cardiovascular:  ?   Rate and Rhythm: Normal rate and regular rhythm.  ?   Heart sounds: Normal heart sounds.  ?  No gallop.  ?Pulmonary:  ?   Effort: Pulmonary effort is normal. No respiratory distress.  ?   Breath sounds: Normal breath sounds. No stridor. No  wheezing, rhonchi or rales.  ?   Comments: Good air exch ?No wheeze even on forced expiration  ?Chest:  ?   Chest wall: No tenderness.  ?Abdominal:  ?   General: There is no abdominal bruit.  ?Musculoskeletal:  ?   Cervical back: Normal range of motion and neck supple.  ?   Right lower leg: No edema.  ?   Left lower leg: No edema.  ?Lymphadenopathy:  ?   Cervical: No cervical adenopathy.  ?Skin: ?   General: Skin is warm  and dry.  ?   Coloration: Skin is not pale.  ?   Findings: No rash.  ?Neurological:  ?   Mental Status: She is alert.  ?   Coordination: Coordination normal.  ?   Deep Tendon Reflexes: Reflexes are normal and symmetric. Reflexes normal.  ?Psychiatric:     ?   Mood and Affect: Mood normal.  ? ? ? ? ? ?   ?Assessment & Plan:  ? ?Problem List Items Addressed This Visit   ? ?  ? Respiratory  ? Viral URI with cough - Primary  ?  This progressed to bacterial sinusitis (treated) ?Had gap in medication due to hiccups causing vomiting ?That is resolved now  ?Reassuring exam  ?Suspect post viral cough syndrome ?Will get back on prednisone and try tessalon ?Avoid mucinex due to poss side eff from hiccups  ?Fluids/rest  ?cxr ordered  ?  ?  ? Relevant Orders  ? DG Chest 2 View  ?  ? Other  ? Hiccups  ?  Severe hiccups-now resolved after sucking on lemons  ?? If side eff of mucinex ?Asked pt to check label of that drug and let us know what other ingredients it had  ? ? ?  ?  ? ? ?

## 2022-01-30 NOTE — Assessment & Plan Note (Signed)
This progressed to bacterial sinusitis (treated) ?Had gap in medication due to hiccups causing vomiting ?That is resolved now  ?Reassuring exam  ?Suspect post viral cough syndrome ?Will get back on prednisone and try tessalon ?Avoid mucinex due to poss side eff from hiccups  ?Fluids/rest  ?cxr ordered  ?

## 2022-01-30 NOTE — Patient Instructions (Addendum)
Check your mucinex ingredient list before you take anything over the counter for cough  ? ?Try tessalon for cough  ?Honey is good  ?Finish your prednisone  (this may help cough as well as eye)  ?Sip fluids ?Sleep propped up  ?Rest your voice  ? ?Let's get a check xray today  ?Your lungs sound clear  ? ?Update [place] if the cough does not gradually improve as well ?Or if worse/ fever or wheezing  ? ? ? ? ? ? ?

## 2022-01-31 ENCOUNTER — Ambulatory Visit (INDEPENDENT_AMBULATORY_CARE_PROVIDER_SITE_OTHER)
Admission: RE | Admit: 2022-01-31 | Discharge: 2022-01-31 | Disposition: A | Discharge status: Home / Self Care | Payer: BC Managed Care – PPO | Source: Ambulatory Visit | Attending: Family Medicine | Admitting: Family Medicine

## 2022-01-31 DIAGNOSIS — J069 Acute upper respiratory infection, unspecified: Secondary | ICD-10-CM

## 2022-02-02 NOTE — Progress Notes (Signed)
?[facility] Note ? ?02/06/2022 ? ?  ? ?CHIEF COMPLAINT ?Patient presents for Retina Follow Up ? ? ?HISTORY OF PRESENT ILLNESS: ?Jennifer Rose is a 66 y.o. female who presents to the clinic today for:  ? ?HPI   ? ? Retina Follow Up   ?Diagnosis: Optic nerve edema.  In right eye.  Severity is moderate.  Duration of 4 weeks.  Since onset it is stable.  I, the attending physician,  performed the HPI with the patient and updated documentation appropriately. ? ?  ?  ? ? Comments   ?Patient states vision the same OU. On prednisone 10 mg bid (20 mg total). Plaquenil 200 mg bid (400 mg total). On latanoprost qhs OU for IOP control.  ? ?  ?  ?Last edited by Bernarda Caffey, MD on 02/06/2022 11:19 PM.  ?  ?Pt states no change in vision, she is still taking '10mg'$  po Prednisone BID, she does not see Dr. Amil Amen again until June ? ?Referring physician: ?Arnoldo Hooker MD ?[address], [address] ?[place],  [address] ? ?HISTORICAL INFORMATION:  ? ?Selected notes from the 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  ? ?CURRENT MEDICATIONS: ?Current Outpatient Medications (Ophthalmic Drugs)  ?Medication Sig  ? latanoprost (XALATAN) 0.005 % ophthalmic solution Place 1 drop into both eyes as directed.  ? ?No current facility-administered medications for this visit. (Ophthalmic Drugs)  ? ?Current Outpatient Medications (Other)  ?Medication Sig  ? amLODipine (NORVASC) 5 MG tablet TAKE 1 TABLET BY MOUTH ONCE A DAY  ? baclofen (LIORESAL) 10 MG tablet Take 0.5-1 tablets (5-10 mg total) by mouth 3 (three) times daily.  ? benzonatate (TESSALON) 200 MG capsule Take 1 capsule (200 mg total) by mouth 3 (three) times daily as needed for cough. Swallow whole  ? cholecalciferol (VITAMIN D) 1000 UNITS tablet Take 2,000 Units by mouth daily.  ? fexofenadine (ALLEGRA) 180 MG  tablet Take 180 mg by mouth daily as needed for allergies or rhinitis.  ? fluticasone (FLONASE) 50 MCG/ACT nasal spray Place 1-2 sprays into both nostrils daily.  ? hydroxychloroquine (PLAQUENIL) 200 MG tablet Take 200 mg by mouth 2 (two) times daily.  ? losartan-hydrochlorothiazide (HYZAAR) 50-12.5 MG tablet TAKE 1 TABLET BY MOUTH ONCE A DAY  ? meclizine (ANTIVERT) 12.5 MG tablet Take 12.5 mg by mouth 3 (three) times daily as needed.  ? potassium chloride (KLOR-CON) 10 MEQ tablet Take 2 tablets (20 mEq total) by mouth daily.  ? predniSONE (DELTASONE) 10 MG tablet Take 10 mg by mouth daily.  ? ?No current facility-administered medications for this visit. (Other)  ? ?REVIEW OF SYSTEMS: ?ROS   ?Positive for: Endocrine, Cardiovascular, Eyes, Allergic/Imm ?Negative for: Constitutional, Gastrointestinal, Neurological, Skin, Genitourinary, Musculoskeletal, HENT, Respiratory, Psychiatric, Heme/Lymph ?Last edited by Roselee Nova D, COT on 02/06/2022  8:00 AM.  ?  ? ?ALLERGIES ?Allergies  ?Allergen Reactions  ? Mucinex Clear & Cool Day-Night   ?  Hiccups   ? ?PAST MEDICAL HISTORY ?Past Medical History:  ?Diagnosis Date  ? Allergy   ? allergic rhinitis  ? Hypertension 03/22/2004  ? Lupus ([place])   ? Uterine fibroid 11/01  ? per GYN  ? ?Past Surgical History:  ?Procedure Laterality Date  ? BREAST CYST EXCISION Right 36 yrs ago  ? x2  ? [facility]    ? ENDOMETRIAL ABLATION  04/03  ? polyps?DUB  ? ?FAMILY HISTORY ?Family History  ?Problem Relation Age of Onset  ? Blindness Mother   ? Stroke Mother   ? Diabetes Mother   ? Glaucoma Mother   ? Glaucoma Father   ? ?SOCIAL HISTORY ?Social History  ? ?Tobacco Use  ? Smoking status: Never  ? Smokeless tobacco: Never  ?Vaping Use  ? Vaping Use: Never used  ?Substance Use Topics  ? Alcohol use: No  ?  Alcohol/week: 0.0 standard drinks  ? Drug use: No  ?  ? ?  ?OPHTHALMIC EXAM: ? ?Base Eye Exam   ? ? Visual Acuity (Snellen - Linear)   ? ?   Right Left  ? Dist cc 20/20 -1 20/20 -1  ? ? Correction:  Glasses  ? ?  ?  ? ? Tonometry (Tonopen, 8:07 AM)   ? ?   Right Left  ? Pressure 18 17  ? ?  ?  ? ? Pupils   ? ?   Dark Light Shape React APD  ? Right 3 2 Round Brisk None  ? Left 3 2 Round Brisk None  ? ?  ?  ? ? Visual Fields (Counting fingers)   ? ?   Left Right  ?  Full Full  ? ?  ?  ? ? Extraocular Movement   ? ?   Right Left  ?  Full, Ortho Full, Ortho  ? ?  ?  ? ? Neuro/Psych   ? ? Oriented x3: Yes  ? Mood/Affect: Normal  ? ?  ?  ? ? Dilation   ? ? Both eyes: 1.0% Mydriacyl, 2.5% Phenylephrine @ 8:07 AM  ? ?  ?  ? ?  ? ?Slit Lamp and Fundus Exam   ? ? Slit Lamp Exam   ? ?   Right Left  ? Lids/Lashes Dermatochalasis - upper lid Dermatochalasis - upper lid  ? Conjunctiva/Sclera mild melanosis mild melanosis  ? Cornea mild arcus, trace, fine endo pigment Clear  ? Anterior Chamber Deep and quiet Deep and quiet  ? Iris Round and dilated, nevus at 0900 Round and dilated, nevus at 0900  ? Lens 2+ Nuclear sclerosis, 2+ Cortical cataract 2+ Nuclear sclerosis, 2+ Cortical cataract  ? Anterior Vitreous Vitreous syneresis, no cell or pigment Vitreous syneresis, no cell or pigment  ? ?  ?  ? ? Fundus Exam   ? ?   Right Left  ? Disc +disc edema with CWS superior rim - CWS and heme improving, disc edema overall decreased, +obscuration of vessels, +heme - improving, +hyperemia nasally Pink and Sharp, focal PPP temporal, ?fine, vascular loops  ? C/D Ratio 0.1 0.1  ? Macula Flat, Good foveal reflex, scattered IRH Flat, Good foveal reflex, rare punctate IRH  ? Vessels attenuated, Tortuous, dilation of temporal venules slightly improved attenuated, Tortuous, copper wiring  ? Periphery Attached, 360 IRH, cluster of larger DBH temporal periphery Attached, scattered MA  ? ?  ?  ? ?  ? ?Refraction   ? ? Wearing Rx   ? ?   Sphere Cylinder Axis Add  ? Right -1.50 +0.25 038 +2.75  ? Left -0.75 +0.25 175 +2.75  ? ?  ?  ? ?  ? ?IMAGING AND PROCEDURES  ?Imaging and Procedures for 02/06/2022 ? ?OCT, Retina - OU - Both Eyes   ? ?   ?Right  Eye ?Quality was good. Central Foveal Thickness: 281. Progression has improved. Findings include normal foveal contour, no IRF,  no SRF (Mild interval improvement in disc edema).  ? ?Left Eye ?Quality was good. Central Foveal Thickness: 256. Progression has been stable. Findings include normal foveal contour, no IRF, no SRF (Partial PVD, no ellipsoid loss).  ? ?Notes ?*Images captured and stored on drive ? ?Diagnosis / Impression:  ?OD: interval improvement in disc edema; NFP, no IRF/SRF ?OS: NFP, no IRF/SRF ?No ellipsoid loss OU -- no Plaquenil toxicity OU ?No DME ? ?Clinical management:  ?See below ? ?Abbreviations: NFP - Normal foveal profile. CME - cystoid macular edema. PED - pigment epithelial detachment. IRF - intraretinal fluid. SRF - subretinal fluid. EZ - ellipsoid zone. ERM - epiretinal membrane. ORA - outer retinal atrophy. ORT - outer retinal tubulation. SRHM - subretinal hyper-reflective material. IRHM - intraretinal hyper-reflective material ? ? ?  ?  ?  ? ?  ?ASSESSMENT/PLAN: ? ?  ICD-10-CM   ?1. 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  ?2. Systemic lupus erythematosus with other organ involvement, unspecified SLE type ([place])  M32.19   ?  ?3. Long-term use of Plaquenil  Z79.899 OCT, Retina - OU - Both Eyes  ?  ?4. 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 2 diabetes mellitus ([place])  B28.4132 OCT, Retina - OU - Both Eyes  ?  ?5. Essential hypertension  I10   ?  ?6. Hypertensive retinopathy of both eyes  H35.033   ?  ?7. Combined forms of age-related cataract of both eyes  H25.813   ?  ? ?1. Optic disc edema OD ? - initial exam showed +CWS on disc rim, obscuration of disc vessels, and +hemorrhages -- today, all improved ? - BCVA remains 20/20, no rAPD ?- first noted by Dr. Lucianne Lei on routine exam, pt asymptomatic ?- CT brain and orbits w and w/o contrast ordered by Dr. Lucianne Lei and imaged 2.16.23 -- no retrobulbar or retro-orbital mass  noted ?- FA 2.21.23 shows hyperfluorescent staining and leakage of disc OD ?- agree with Dr. 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 Amil Amen started pt on '40mg'$  po Prednison

## 2022-02-06 ENCOUNTER — Ambulatory Visit (INDEPENDENT_AMBULATORY_CARE_PROVIDER_SITE_OTHER): Payer: BC Managed Care – PPO | Admitting: Ophthalmology

## 2022-02-06 ENCOUNTER — Encounter (INDEPENDENT_AMBULATORY_CARE_PROVIDER_SITE_OTHER): Payer: Self-pay | Admitting: Ophthalmology

## 2022-02-06 DIAGNOSIS — M3219 Other organ or system involvement in systemic lupus erythematosus: Secondary | ICD-10-CM

## 2022-02-06 DIAGNOSIS — I10 Essential (primary) hypertension: Secondary | ICD-10-CM | POA: Diagnosis not present

## 2022-02-06 DIAGNOSIS — Z79899 Other long term (current) drug therapy: Secondary | ICD-10-CM

## 2022-02-06 DIAGNOSIS — E113293 Type 2 diabetes mellitus with mild nonproliferative diabetic retinopathy without macular edema, bilateral: Secondary | ICD-10-CM | POA: Diagnosis not present

## 2022-02-06 DIAGNOSIS — H35033 Hypertensive retinopathy, bilateral: Secondary | ICD-10-CM | POA: Diagnosis not present

## 2022-02-06 DIAGNOSIS — H4710 Unspecified papilledema: Secondary | ICD-10-CM | POA: Diagnosis not present

## 2022-02-06 DIAGNOSIS — H25813 Combined forms of age-related cataract, bilateral: Secondary | ICD-10-CM

## 2022-02-14 ENCOUNTER — Encounter: Payer: Self-pay | Admitting: Family Medicine

## 2022-02-14 ENCOUNTER — Ambulatory Visit: Payer: BC Managed Care – PPO | Admitting: Family Medicine

## 2022-02-14 VITALS — BP 118/72 | HR 76 | Temp 97.3°F | Ht 64.5 in | Wt 205.1 lb

## 2022-02-14 DIAGNOSIS — I10 Essential (primary) hypertension: Secondary | ICD-10-CM

## 2022-02-14 DIAGNOSIS — M3219 Other organ or system involvement in systemic lupus erythematosus: Secondary | ICD-10-CM

## 2022-02-14 DIAGNOSIS — R058 Other specified cough: Secondary | ICD-10-CM | POA: Diagnosis not present

## 2022-02-14 DIAGNOSIS — R7303 Prediabetes: Secondary | ICD-10-CM | POA: Diagnosis not present

## 2022-02-14 LAB — BASIC METABOLIC PANEL
BUN: 18 mg/dL (ref 6–23)
CO2: 25 mEq/L (ref 19–32)
Calcium: 9.1 mg/dL (ref 8.4–10.5)
Chloride: 101 mEq/L (ref 96–112)
Creatinine, Ser: 1.24 mg/dL — ABNORMAL HIGH (ref 0.40–1.20)
GFR: 46.19 mL/min — ABNORMAL LOW (ref >60.00)
Glucose, Bld: 201 mg/dL — ABNORMAL HIGH (ref 70–99)
Potassium: 4.1 mEq/L (ref 3.5–5.1)
Sodium: 136 mEq/L (ref 135–145)

## 2022-02-14 LAB — HEMOGLOBIN A1C: Hgb A1c MFr Bld: 7.2 % — ABNORMAL HIGH (ref 4.6–6.5)

## 2022-02-14 MED ORDER — FAMOTIDINE 20 MG PO TABS: 20.0000 mg | ORAL_TABLET | Freq: Every day | ORAL | 2 refills | Status: DC

## 2022-02-14 NOTE — Assessment & Plan Note (Addendum)
a1c ordered  ?Suspect this will be up with recent prednisone for SLE ?If in dm range would avoid metformin for renal reasons  ?May consider GLP (info given) ?Has to take prednisone through June for SLE ?Disc low glycemic diet  ?

## 2022-02-14 NOTE — Progress Notes (Signed)
? ?Subjective:  ? ? Patient ID: Jennifer Rose, female    DOB: Mar 18, 1956, 66 y.o.   MRN: [id number] ? ?HPI ?Here for f/u of post viral cough syndrome  ? ?Wt Readings from Last 3 Encounters:  ?02/14/22 205 lb 2 oz (93 kg)  ?01/30/22 206 lb 2 oz (93.5 kg)  ?01/22/22 204 lb 9 oz (92.8 kg)  ? ?34.67 kg/m? ? ?Last visit we urged to get back on prednisone and try tessalon  ?Last visit we did cxr  ? ?DG Chest 2 View (Accession [id number]) (Order [id number]) ?Imaging ?Date: 01/30/2022 Department: [place] [facility] Ordering/Authorizing: Tyion Boylen, Wynelle Fanny, MD  ? ?Exam Status ? ?Status  ?Final [99]  ? ?PACS Intelerad Image Link ? ? Show images for DG Chest 2 View ? ?Study Result ? ?Narrative & Impression  ?CLINICAL DATA:  Provided history: Viral URI with cough. Cough with ?respiratory virus since late March. ?  ?EXAM: ?CHEST - 2 VIEW ?  ?COMPARISON:  Prior chest radiographs 05/02/2016. ?  ?FINDINGS: ?Heart size within normal limits. No appreciable airspace ?consolidation. No evidence of pleural effusion or pneumothorax. No ?acute bony abnormality identified. Surgical clips within the right ?upper quadrant of the abdomen. ?  ?IMPRESSION: ?No evidence of active cardiopulmonary disease. ?  ? ?She ended up going to urgent care on 4/19 for the lingering cough  ?Flu and covid tests still negative  ? ?Takes 20 mg prednisone daily  ? ?Was on 40 mg daily for a while/ too much for her  ?Lupus is doing good ? ? ?Allergies- try to avoid the pollen  ?Does not feel bad with them  ? ?May have a little acid reflux -watches diet  ? ?Now cough is improved but still feels like something is stuck in throat  ?Mucous is clear ?No sob  ?No ST  ? ?No fevers  ?Feels ok  ? ?BP Readings from Last 3 Encounters:  ?02/14/22 118/72  ?01/30/22 130/80  ?01/22/22 112/68  ? ? ?Last a1c 6.7  ?On prednisone  ?Was not eating great when sick  ?Avoids sodas - does drink 4 oz of juice in am  ?Cannot take metformin due to renal effects  ? ? ?Patient  Active Problem List  ? Diagnosis Date Noted  ? Hiccups 01/30/2022  ? Sore throat 01/11/2022  ? Colon cancer screening 11/12/2019  ? H/O migraine 08/26/2018  ? Routine general medical examination at a health care facility 08/26/2018  ? Hypokalemia 08/26/2018  ? Thrombocytopenia ([place]) 04/30/2017  ? Prediabetes 12/13/2014  ? 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 GLAUCOMA 04/15/2009  ? LUPUS 11/09/2008  ? Post-viral cough syndrome 09/24/2007  ? Allergic rhinitis 09/24/2007  ? Essential hypertension 03/22/2004  ? ?Past Medical History:  ?Diagnosis Date  ? Allergy   ? allergic rhinitis  ? Hypertension 03/22/2004  ? Lupus ([place])   ? Uterine fibroid 11/01  ? per GYN  ? ?Past Surgical History:  ?Procedure Laterality Date  ? BREAST CYST EXCISION Right 36 yrs ago  ? x2  ? ccy    ? ENDOMETRIAL ABLATION  04/03  ? polyps?DUB  ? ?Social History  ? ?Tobacco Use  ? Smoking status: Never  ? Smokeless tobacco: Never  ?Vaping Use  ? Vaping Use: Never used  ?Substance Use Topics  ? Alcohol use: No  ?  Alcohol/week: 0.0 standard drinks  ? Drug use: No  ? ?Family History  ?  Problem Relation Age of Onset  ? Blindness Mother   ? Stroke Mother   ? Diabetes Mother   ? Glaucoma Mother   ? Glaucoma Father   ? ?Allergies  ?Allergen Reactions  ? Mucinex Clear & Cool Day-Night   ?  Hiccups   ? ?Current Outpatient Medications on File Prior to Visit  ?Medication Sig Dispense Refill  ? amLODipine (NORVASC) 5 MG tablet TAKE 1 TABLET BY MOUTH ONCE A DAY 90 tablet 0  ? baclofen (LIORESAL) 10 MG tablet Take 0.5-1 tablets (5-10 mg total) by mouth 3 (three) times daily. 60 tablet 0  ? benzonatate (TESSALON) 200 MG capsule Take 1 capsule (200 mg total) by mouth 3 (three) times daily as needed for cough. Swallow whole 30 capsule 1  ? cholecalciferol (VITAMIN D) 1000 UNITS tablet Take 2,000 Units by mouth daily.    ? fexofenadine (ALLEGRA) 180 MG tablet Take 180 mg by mouth daily  as needed for allergies or rhinitis.    ? fluticasone (FLONASE) 50 MCG/ACT nasal spray Place 1-2 sprays into both nostrils daily. 16 g 5  ? hydroxychloroquine (PLAQUENIL) 200 MG tablet Take 200 mg by mouth 2 (two) times daily.    ? latanoprost (XALATAN) 0.005 % ophthalmic solution Place 1 drop into both eyes as directed.    ? losartan-hydrochlorothiazide (HYZAAR) 50-12.5 MG tablet TAKE 1 TABLET BY MOUTH ONCE A DAY 90 tablet 0  ? meclizine (ANTIVERT) 12.5 MG tablet Take 12.5 mg by mouth 3 (three) times daily as needed.    ? potassium chloride (KLOR-CON) 10 MEQ tablet Take 2 tablets (20 mEq total) by mouth daily. 180 tablet 3  ? predniSONE (DELTASONE) 10 MG tablet Take 10 mg by mouth daily.    ? ?No current facility-administered medications on file prior to visit.  ?  ?Review of Systems  ?Constitutional:  Negative for activity change, appetite change, fatigue, fever and unexpected weight change.  ?HENT:  Negative for congestion, ear pain, rhinorrhea, sinus pressure and sore throat.   ?Eyes:  Negative for pain, redness and visual disturbance.  ?Respiratory:  Positive for cough. Negative for shortness of breath and wheezing.   ?Cardiovascular:  Negative for chest pain and palpitations.  ?Gastrointestinal:  Negative for abdominal pain, blood in stool, constipation and diarrhea.  ?Endocrine: Negative for polydipsia and polyuria.  ?Genitourinary:  Negative for dysuria, frequency and urgency.  ?Musculoskeletal:  Positive for arthralgias. Negative for back pain and myalgias.  ?Skin:  Negative for pallor and rash.  ?Allergic/Immunologic: Negative for environmental allergies.  ?Neurological:  Negative for dizziness, syncope and headaches.  ?Hematological:  Negative for adenopathy. Does not bruise/bleed easily.  ?Psychiatric/Behavioral:  Negative for decreased concentration and dysphoric mood. The patient is not nervous/anxious.   ? ?   ?Objective:  ? Physical Exam ?Constitutional:   ?   General: She is not in acute distress. ?    Appearance: Normal appearance. She is well-developed. She is obese. She is not ill-appearing or diaphoretic.  ?HENT:  ?   Head: Normocephalic and atraumatic.  ?   Nose: Rhinorrhea present.  ?   Mouth/Throat:  ?   Mouth: Mucous membranes are moist.  ?   Pharynx: No posterior oropharyngeal erythema.  ?Eyes:  ?   General: No scleral icterus. ?   Conjunctiva/sclera: Conjunctivae normal.  ?   Pupils: Pupils are equal, round, and reactive to light.  ?Neck:  ?   Thyroid: No thyromegaly.  ?   Vascular: No carotid bruit or JVD.  ?Cardiovascular:  ?  Rate and Rhythm: Normal rate and regular rhythm.  ?   Heart sounds: Normal heart sounds.  ?  No gallop.  ?Pulmonary:  ?   Effort: Pulmonary effort is normal. No respiratory distress.  ?   Breath sounds: Normal breath sounds. No stridor. No wheezing, rhonchi or rales.  ?Abdominal:  ?   General: There is no distension or abdominal bruit.  ?   Palpations: Abdomen is soft.  ?Musculoskeletal:  ?   Cervical back: Normal range of motion and neck supple.  ?   Right lower leg: No edema.  ?   Left lower leg: No edema.  ?Lymphadenopathy:  ?   Cervical: No cervical adenopathy.  ?Skin: ?   General: Skin is warm and dry.  ?   Coloration: Skin is not pale.  ?   Findings: No rash.  ?Neurological:  ?   Mental Status: She is alert.  ?   Coordination: Coordination normal.  ?   Deep Tendon Reflexes: Reflexes are normal and symmetric. Reflexes normal.  ?Psychiatric:     ?   Mood and Affect: Mood normal.  ? ? ? ? ? ?   ?Assessment & Plan:  ? ?Problem List Items Addressed This Visit   ? ?  ? Cardiovascular and Mediastinum  ? Essential hypertension  ?  bp in fair control at this time  ?BP Readings from Last 1 Encounters:  ?02/14/22 118/72  ?No changes needed ?Most recent labs reviewed  ?Disc lifstyle change with low sodium diet and exercise  ?Plan to continue amlodipine 5 mg daily and losartan hct 50-12.5 mg daily  ?Labs ordered  ?  ?  ? Relevant Orders  ? Basic metabolic panel (Completed)  ?  ?  Respiratory  ? Post-viral cough syndrome - Primary  ?  Gradually improving  ?Reviewed recent xray ?reviewed recent UC notes with neg covid and flu tests ? ?Px pepcid 20 mg daily to use for 2 wk to see if it h

## 2022-02-14 NOTE — Assessment & Plan Note (Signed)
Taking prednisone 20 mg daily until June (40 mg caused a lot of side eff) ?Continue rheum care  ?

## 2022-02-14 NOTE — Patient Instructions (Signed)
Try to eat low glycemic ?Try to get most of your carbohydrates from produce (with the exception of white potatoes)  ?Eat less bread/pasta/rice/snack foods/cereals/sweets and other items from the middle of the grocery store (processed carbs) ? ?Add pepcid 20 mg daily in the morning for extra cough control  ?Take it for a month  ? ?If cough does not continue to improve let us know  ? ?Labs today  ? ?Here is info on GLP medication  ? ?

## 2022-02-14 NOTE — Assessment & Plan Note (Signed)
bp in fair control at this time  ?BP Readings from Last 1 Encounters:  ?02/14/22 118/72  ? ?No changes needed ?Most recent labs reviewed  ?Disc lifstyle change with low sodium diet and exercise  ?Plan to continue amlodipine 5 mg daily and losartan hct 50-12.5 mg daily  ?Labs ordered  ?

## 2022-02-14 NOTE — Assessment & Plan Note (Signed)
Gradually improving  ?Reviewed recent xray ?reviewed recent UC notes with neg covid and flu tests ? ?Px pepcid 20 mg daily to use for 2 wk to see if it helps with cough  ?Update if not starting to improve in a week or if worsening   ? ?

## 2022-02-15 ENCOUNTER — Encounter: Payer: Self-pay | Admitting: Family Medicine

## 2022-03-16 ENCOUNTER — Other Ambulatory Visit: Payer: Self-pay | Admitting: Family Medicine

## 2022-03-29 NOTE — Progress Notes (Signed)
[facility] Note  04/02/2022     CHIEF COMPLAINT Patient presents for Retina Follow Up   HISTORY OF PRESENT ILLNESS: Jennifer Rose is a 66 y.o. female who presents to the clinic today for:   HPI     Retina Follow Up   Patient presents with  Other.  In both eyes.  This started 8 weeks ago.  I, the attending physician,  performed the HPI with the patient and updated documentation appropriately.        Comments   Patient here for 8 weeks retina follow up for optic disc edema. Patient states vision doing pretty good. No eye pain.       Last edited by Bernarda Caffey, MD on 04/02/2022  8:40 AM.    Patient was seen by Dr. Amil Amen 06.09.23.  Patient is currently on Prednisone po '10mg'$ .  Looking to reduce further to '5mg'$  per Dr. Melissa Noon note.  Patient reduced '20mg'$  in April due to dizziness. Patient states the Prednisone is elevating her BS.   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   No current facility-administered medications for this visit. (Ophthalmic Drugs)   Current Outpatient Medications (Other)  Medication Sig   amLODipine (NORVASC) 5 MG tablet TAKE 1 TABLET BY MOUTH ONCE A DAY   baclofen (LIORESAL) 10 MG tablet Take 0.5-1 tablets (5-10 mg total) by mouth 3 (three) times daily.   benzonatate (TESSALON) 200 MG capsule Take 1 capsule (200 mg total) by mouth 3 (three) times daily as needed for cough. Swallow whole   cholecalciferol (VITAMIN D) 1000 UNITS tablet Take 2,000 Units by  mouth daily.   famotidine (PEPCID) 20 MG tablet Take 1 tablet (20 mg total) by mouth daily.   fexofenadine (ALLEGRA) 180 MG tablet Take 180 mg by mouth daily as needed for allergies or rhinitis.   fluticasone (FLONASE) 50 MCG/ACT nasal spray Place 1-2 sprays into both nostrils daily.   hydroxychloroquine (PLAQUENIL) 200 MG tablet Take 200 mg by mouth 2 (two) times daily.   losartan-hydrochlorothiazide (HYZAAR) 50-12.5 MG tablet TAKE 1 TABLET BY MOUTH ONCE A DAY   meclizine (ANTIVERT) 12.5 MG tablet Take 12.5 mg by mouth 3 (three) times daily as needed.   potassium chloride (KLOR-CON) 10 MEQ tablet Take 2 tablets (20 mEq total) by mouth daily.   predniSONE (DELTASONE) 10 MG tablet Take 10 mg by mouth daily.   No current facility-administered medications for this visit. (Other)   REVIEW OF SYSTEMS: ROS   Positive for: Endocrine, Cardiovascular, Eyes, Allergic/Imm Negative for: Constitutional, Gastrointestinal, Neurological, Skin, Genitourinary, Musculoskeletal, HENT, Respiratory, Psychiatric, Heme/Lymph Last edited by Theodore Demark, COA on 04/02/2022  7:53 AM.     ALLERGIES Allergies  Allergen Reactions   Mucinex Clear & Cool Day-Night     Hiccups    PAST MEDICAL HISTORY Past Medical History:  Diagnosis Date   Allergy    allergic rhinitis   Hypertension 03/22/2004   Lupus ([place])    Uterine fibroid 11/01   per GYN   Past Surgical History:  Procedure  Laterality Date   BREAST CYST EXCISION Right 36 yrs ago   x2   ccy     ENDOMETRIAL ABLATION  04/03   polyps?DUB   FAMILY HISTORY Family History  Problem Relation Age of Onset   Blindness Mother    Stroke Mother    Diabetes Mother    Glaucoma Mother    Glaucoma Father    SOCIAL HISTORY Social History   Tobacco Use   Smoking status: Never   Smokeless tobacco: Never  Vaping Use   Vaping Use: Never used  Substance Use Topics   Alcohol use: No    Alcohol/week: 0.0 standard drinks of alcohol   Drug use: No        OPHTHALMIC EXAM:  Base Eye Exam     Visual Acuity (Snellen - Linear)       Right Left   Dist cc 20/20 20/20    Correction: Glasses         Tonometry (Tonopen, 7:50 AM)       Right Left   Pressure 16 16         Pupils       Dark Light Shape React APD   Right 3 2 Round Brisk None   Left 3 2 Round Brisk None         Visual Fields (Counting fingers)       Left Right    Full Full         Extraocular Movement       Right Left    Full, Ortho Full, Ortho         Neuro/Psych     Oriented x3: Yes   Mood/Affect: Normal         Dilation     Both eyes: 1.0% Mydriacyl, 2.5% Phenylephrine @ 7:50 AM           Slit Lamp and Fundus Exam     Slit Lamp Exam       Right Left   Lids/Lashes Dermatochalasis - upper lid Dermatochalasis - upper lid   Conjunctiva/Sclera mild melanosis mild melanosis   Cornea mild arcus, trace, fine endo pigment Clear   Anterior Chamber Deep and clear Deep and clear   Iris Round and dilated, nevus at 0900 Round and dilated   Lens 2+ Nuclear sclerosis, 2+ Cortical cataract 2+ Nuclear sclerosis, 2+ Cortical cataract   Anterior Vitreous Vitreous syneresis, no cell or pigment Vitreous syneresis, no cell or pigment         Fundus Exam       Right Left   Disc 1+ disc edema with mild CWS superior and nasal rim -- persistent -- minimal improvement from prior, +obscuration of vessels, +heme - improving, +hyperemia nasally Pink and Sharp, focal PPP temporal, ?fine, vascular loops   C/D Ratio 0.0 0.1   Macula Flat, Good foveal reflex, scattered IRH greatest perifovea Flat, Good foveal reflex, rare punctate IRH   Vessels attenuated, Tortuous, Copper wiring, AV crossing changes attenuated, Tortuous, copper wiring   Periphery Attached, 360 IRH, cluster of larger DBH temporal periphery -- persistent Attached, scattered MA           Refraction     Wearing Rx       Sphere Cylinder Axis Add   Right -1.50 +0.25 038 +2.75   Left  -0.75 +0.25 175 +2.75           IMAGING AND PROCEDURES  Imaging and Procedures for 04/02/2022  OCT, Retina - OU - Both  Eyes       Right Eye Quality was good. Central Foveal Thickness: 280. Progression has been stable. Findings include normal foveal contour, no IRF, no SRF (Persistent disc edema, no significant decrease from prior. Partial PVD. ).   Left Eye Quality was good. Central Foveal Thickness: 253. Progression has been stable. Findings include normal foveal contour, no IRF, no SRF (Partial PVD).   Notes *Images captured and stored on drive  Diagnosis / Impression:  OD: Persistent disc edema, no significant decrease from prior. Partial PVD OS: NFP, no IRF/SRF No Plaquenil toxicity OU No DME  Clinical management:  See below  Abbreviations: NFP - Normal foveal profile. CME - cystoid macular edema. PED - pigment epithelial detachment. IRF - intraretinal fluid. SRF - subretinal fluid. EZ - ellipsoid zone. ERM - epiretinal membrane. ORA - outer retinal atrophy. ORT - outer retinal tubulation. SRHM - subretinal hyper-reflective material. IRHM - intraretinal hyper-reflective material            ASSESSMENT/PLAN:    ICD-10-CM   1. 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 Systemic lupus erythematosus with other organ involvement, unspecified SLE type ([place])  M32.19     3. Long-term use of Plaquenil  Z79.899     4. 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 2 diabetes mellitus ([place])  B14.7829     5. Essential hypertension  I10     6. Hypertensive retinopathy of both eyes  H35.033     7. Combined forms of age-related cataract of both eyes  H25.813      1. Optic disc edema OD  - initial exam showed +CWS on disc rim, obscuration of disc vessels, and +hemorrhages -- today, all improved  - BCVA remains 20/20, no rAPD - first noted by Dr. Lucianne Lei on routine exam, pt asymptomatic - CT brain and  orbits w and w/o contrast ordered by Dr. Lucianne Lei and imaged 2.16.23 -- no retrobulbar or retro-orbital mass noted - FA 2.21.23 shows hyperfluorescent staining and leakage of disc OD - agree with Dr. 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 Amil Amen started pt on '40mg'$  po Prednisone ('20mg'$  BID) on March 10th - pt developed dizziness and medication was decreased to '10mg'$  in April  - exam and OCT today shows persistent disc edema and vascular congestion -- no significant improvement in edema, maybe even mild increase in disc edema - may benefit from additional immunomodulatory therapy -- ?Methotrexate or Cellcept? - will send Dr. Amil Amen notes - continue po prednisone per Dr. Melissa Noon recommendations -- pt with concerns about blood glucose on po prednisone - f/u in 6-8 wks -- DFE/OCT  2,3. 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 Mild nonproliferative diabetic retinopathy w/o DME, both  eyes - The incidence, risk factors for progression, natural history and treatment options for diabetic retinopathy were discussed with patient.   - The need for close monitoring of blood glucose, blood pressure, and serum lipids, avoiding cigarette or any type of tobacco, and the need for long term follow up was also discussed with patient. - exam shows very mild MA OU  - FA  02.21.23 with mild MA OU - OCT without diabetic macular edema, both eyes  - BCVA 20/20 OU - monitor  5,6. Hypertensive retinopathy OU - discussed importance of tight BP control - monitor  7. Mixed Cataract OU - The symptoms of cataract, surgical options, and treatments and risks were discussed with patient. - discussed diagnosis and progression - not yet visually significant - monitor for now  Ophthalmic Meds Ordered this visit:  No orders of the defined types were placed in this encounter.    Return for Return in 6-8 weeks for DFE, OCT.  There are no Patient Instructions on file for this visit.   Explained the diagnoses, plan, and follow up with the patient and they expressed understanding.  Patient expressed understanding of the importance of proper follow up care.   This document serves as a record of services personally performed by Gardiner Sleeper, MD, PhD. It was created on their behalf by Roselee Nova, COMT. The creation of this record is the provider's dictation and/or activities during the visit.  Electronically signed by: Roselee Nova, COMT 04/02/22 8:41 AM  This document serves as a record of services personally performed by Gardiner Sleeper, MD, PhD. It was created on their behalf by Leonie Douglas, an ophthalmic technician. The creation of this record is the provider's dictation and/or activities during the visit.    Electronically signed by: Leonie Douglas COA, 04/02/22  8:41 AM  Gardiner Sleeper, M.D., Ph.D. Diseases & Surgery of the Retina and Vitreous [facility]  I have reviewed the above documentation for accuracy and completeness, and I agree with the above. Gardiner Sleeper, M.D., Ph.D. 04/02/22 8:46 AM    Abbreviations: M myopia (nearsighted); A astigmatism; H hyperopia (farsighted); P presbyopia; Mrx spectacle prescription;  CTL contact lenses; OD right eye; OS left eye; OU both eyes  XT exotropia; ET esotropia; PEK punctate epithelial  keratitis; PEE punctate epithelial erosions; DES dry eye syndrome; MGD meibomian gland dysfunction; ATs artificial tears; PFAT's preservative free artificial tears; [place] nuclear sclerotic cataract; PSC posterior subcapsular cataract; ERM epi-retinal membrane; PVD posterior vitreous detachment; RD retinal detachment; DM diabetes mellitus; DR diabetic retinopathy; NPDR non-proliferative diabetic retinopathy; PDR proliferative diabetic retinopathy; CSME clinically significant macular edema; DME diabetic macular edema; dbh dot blot hemorrhages; CWS cotton wool spot; POAG primary open angle glaucoma; C/D cup-to-disc ratio; HVF humphrey visual field; GVF goldmann visual field; OCT optical coherence tomography; IOP intraocular pressure; BRVO Branch retinal vein occlusion; CRVO central retinal vein occlusion; CRAO central retinal artery occlusion; BRAO branch retinal artery occlusion; RT retinal tear; SB scleral buckle; PPV pars plana vitrectomy; VH Vitreous hemorrhage; PRP panretinal laser photocoagulation; IVK intravitreal kenalog; VMT vitreomacular traction; MH Macular hole;  NVD neovascularization of the disc; NVE neovascularization elsewhere; AREDS age related eye disease study; ARMD age related macular degeneration; POAG primary open angle glaucoma; EBMD epithelial/anterior basement membrane dystrophy; ACIOL anterior chamber intraocular lens; IOL intraocular lens; PCIOL posterior chamber intraocular lens; Phaco/IOL phacoemulsification with intraocular lens placement; [place] photorefractive keratectomy; LASIK laser assisted in situ keratomileusis; HTN hypertension; DM diabetes mellitus; COPD chronic  obstructive pulmonary disease

## 2022-04-02 ENCOUNTER — Encounter (INDEPENDENT_AMBULATORY_CARE_PROVIDER_SITE_OTHER): Payer: Self-pay | Admitting: Ophthalmology

## 2022-04-02 ENCOUNTER — Ambulatory Visit (INDEPENDENT_AMBULATORY_CARE_PROVIDER_SITE_OTHER): Payer: BC Managed Care – PPO | Admitting: Ophthalmology

## 2022-04-02 DIAGNOSIS — H35033 Hypertensive retinopathy, bilateral: Secondary | ICD-10-CM | POA: Diagnosis not present

## 2022-04-02 DIAGNOSIS — I10 Essential (primary) hypertension: Secondary | ICD-10-CM

## 2022-04-02 DIAGNOSIS — E113293 Type 2 diabetes mellitus with mild nonproliferative diabetic retinopathy without macular edema, bilateral: Secondary | ICD-10-CM

## 2022-04-02 DIAGNOSIS — H4710 Unspecified papilledema: Secondary | ICD-10-CM | POA: Diagnosis not present

## 2022-04-02 DIAGNOSIS — M3219 Other organ or system involvement in systemic lupus erythematosus: Secondary | ICD-10-CM

## 2022-04-02 DIAGNOSIS — Z79899 Other long term (current) drug therapy: Secondary | ICD-10-CM

## 2022-04-02 DIAGNOSIS — H25813 Combined forms of age-related cataract, bilateral: Secondary | ICD-10-CM

## 2022-04-19 ENCOUNTER — Encounter: Payer: Self-pay | Admitting: Family Medicine

## 2022-04-19 ENCOUNTER — Ambulatory Visit: Payer: BC Managed Care – PPO | Admitting: Family Medicine

## 2022-04-19 VITALS — BP 126/78 | HR 72 | Temp 98.0°F | Ht 64.5 in | Wt 205.2 lb

## 2022-04-19 DIAGNOSIS — R09A2 Foreign body sensation, throat: Secondary | ICD-10-CM | POA: Insufficient documentation

## 2022-04-19 DIAGNOSIS — R0989 Other specified symptoms and signs involving the circulatory and respiratory systems: Secondary | ICD-10-CM | POA: Diagnosis not present

## 2022-04-19 DIAGNOSIS — R7303 Prediabetes: Secondary | ICD-10-CM

## 2022-04-19 DIAGNOSIS — R058 Other specified cough: Secondary | ICD-10-CM | POA: Diagnosis not present

## 2022-04-19 DIAGNOSIS — R944 Abnormal results of kidney function studies: Secondary | ICD-10-CM | POA: Insufficient documentation

## 2022-04-19 NOTE — Progress Notes (Signed)
Subjective:    Patient ID: Jennifer Rose, female    DOB: 08-03-1956, 66 y.o.   MRN: [id number]  HPI Pt presents with chronic cough  Wt Readings from Last 3 Encounters:  04/19/22 205 lb 3.2 oz (93.1 kg)  02/14/22 205 lb 2 oz (93 kg)  01/30/22 206 lb 2 oz (93.5 kg)   34.68 kg/m    Last visit discussed post viral cough syndrome  Had been on prednisone  Cxr nl  Px pepcid 20 mg daily for 2 weeks   Cough is productive  Clear mucous Uses flonase  Feels like something is in the back of her throat  No wheeze No tightness   May snore a little bit  Not much cough during the day  If she takes a drink it does make her cough / or initially when eating   HTN bp is stable today  No cp or palpitations or headaches or edema  No side effects to medicines  BP Readings from Last 3 Encounters:  04/19/22 126/78  02/14/22 118/72  01/30/22 130/80     Losartan hct 50-12.5 mg daily  Amlodipine 5 mg    Has been on prednisone for eye issues with lupus Was able to stop it  Saw sub specialist and was given new medicine for 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   Lab Results  Component Value Date   CREATININE 1.24 (H) 02/14/2022   BUN 18 02/14/2022   NA 136 02/14/2022   K 4.1 02/14/2022   CL 101 02/14/2022   CO2 25 02/14/2022   GFR 46.1 Lab Results  Component Value Date   HGBA1C 7.2 (H) 02/14/2022   Discussed   No carbs/sugars, is doing very well  Likes fruits and veggies   Sees rheumatology in august  Patient Active Problem List   Diagnosis Date Noted   Decreased GFR 04/19/2022   Globus sensation 04/19/2022   Hiccups 01/30/2022   Sore throat 01/11/2022   Colon cancer screening 11/12/2019   H/O migraine 08/26/2018   Routine general medical examination at a health care facility 08/26/2018   Hypokalemia 08/26/2018   Thrombocytopenia ([place]) 04/30/2017   Prediabetes 12/13/2014   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 36.9  in adult 12/13/2014   GLAUCOMA 04/15/2009   LUPUS 11/09/2008   Post-viral cough syndrome 09/24/2007   Allergic rhinitis 09/24/2007   Essential hypertension 03/22/2004   Past Medical History:  Diagnosis Date   Allergy    allergic rhinitis   Hypertension 03/22/2004   Lupus ([place])    Uterine fibroid 11/01   per GYN   Past Surgical History:  Procedure Laterality Date   BREAST CYST EXCISION Right 36 yrs ago   x2   ccy     ENDOMETRIAL ABLATION  04/03   polyps?DUB   Social History   Tobacco Use   Smoking status: Never   Smokeless tobacco: Never  Vaping Use   Vaping Use: Never used  Substance Use Topics   Alcohol use: No    Alcohol/week: 0.0 standard drinks of alcohol   Drug use: No   Family History  Problem Relation Age of Onset   Blindness Mother    Stroke Mother    Diabetes Mother    Glaucoma Mother    Glaucoma Father    Allergies  Allergen Reactions   Mucinex Clear & Cool Day-Night     Hiccups    Current Outpatient Medications on File Prior to  Visit  Medication Sig Dispense Refill   amLODipine (NORVASC) 5 MG tablet TAKE 1 TABLET BY MOUTH ONCE A DAY 90 tablet 0   baclofen (LIORESAL) 10 MG tablet Take 0.5-1 tablets (5-10 mg total) by mouth 3 (three) times daily. 60 tablet 0   cholecalciferol (VITAMIN D) 1000 UNITS tablet Take 2,000 Units by mouth daily.     famotidine (PEPCID) 20 MG tablet Take 1 tablet (20 mg total) by mouth daily. 30 tablet 2   fexofenadine (ALLEGRA) 180 MG tablet Take 180 mg by mouth daily as needed for allergies or rhinitis.     fluticasone (FLONASE) 50 MCG/ACT nasal spray Place 1-2 sprays into both nostrils daily. 16 g 5   hydroxychloroquine (PLAQUENIL) 200 MG tablet Take 200 mg by mouth 2 (two) times daily.     latanoprost (XALATAN) 0.005 % ophthalmic solution Place 1 drop into both eyes as directed.     losartan-hydrochlorothiazide (HYZAAR) 50-12.5 MG tablet TAKE 1 TABLET BY MOUTH ONCE A DAY 90 tablet 2   meclizine (ANTIVERT) 12.5 MG tablet  Take 12.5 mg by mouth 3 (three) times daily as needed.     mycophenolate (CELLCEPT) 500 MG tablet Take 1,000 mg by mouth 2 (two) times daily.     potassium chloride (KLOR-CON) 10 MEQ tablet Take 2 tablets (20 mEq total) by mouth daily. 180 tablet 3   No current facility-administered medications on file prior to visit.    Review of Systems  Constitutional:  Positive for fatigue. Negative for activity change, appetite change, fever and unexpected weight change.  HENT:  Negative for congestion, ear pain, rhinorrhea, sinus pressure and sore throat.   Eyes:  Negative for pain, redness and visual disturbance.  Respiratory:  Positive for cough. Negative for shortness of breath and wheezing.   Cardiovascular:  Negative for chest pain and palpitations.  Gastrointestinal:  Negative for abdominal pain, blood in stool, constipation and diarrhea.  Endocrine: Negative for polydipsia and polyuria.  Genitourinary:  Negative for dysuria, frequency and urgency.  Musculoskeletal:  Positive for arthralgias. Negative for back pain and myalgias.  Skin:  Negative for pallor and rash.  Allergic/Immunologic: Negative for environmental allergies.  Neurological:  Negative for dizziness, syncope and headaches.  Hematological:  Negative for adenopathy. Does not bruise/bleed easily.  Psychiatric/Behavioral:  Negative for decreased concentration and dysphoric mood. The patient is not nervous/anxious.        Objective:   Physical Exam Constitutional:      General: She is not in acute distress.    Appearance: Normal appearance. She is well-developed. She is obese. She is not ill-appearing or diaphoretic.  HENT:     Head: Normocephalic and atraumatic.     Right Ear: Tympanic membrane and ear canal normal.     Left Ear: Tympanic membrane and ear canal normal.     Nose: Nose normal. No congestion or rhinorrhea.     Mouth/Throat:     Mouth: Mucous membranes are moist.     Pharynx: Oropharynx is clear. No  oropharyngeal exudate or posterior oropharyngeal erythema.  Eyes:     Conjunctiva/sclera: Conjunctivae normal.     Pupils: Pupils are equal, round, and reactive to light.  Neck:     Thyroid: No thyromegaly.     Vascular: No carotid bruit or JVD.  Cardiovascular:     Rate and Rhythm: Normal rate and regular rhythm.     Heart sounds: Normal heart sounds.     No gallop.  Pulmonary:     Effort: Pulmonary  effort is normal. No respiratory distress.     Breath sounds: Normal breath sounds. No stridor. No wheezing, rhonchi or rales.  Chest:     Chest wall: Tenderness present.  Abdominal:     General: There is no distension or abdominal bruit.     Palpations: Abdomen is soft.  Musculoskeletal:     Cervical back: Normal range of motion and neck supple.     Right lower leg: No edema.     Left lower leg: No edema.  Lymphadenopathy:     Cervical: No cervical adenopathy.  Skin:    General: Skin is warm and dry.     Coloration: Skin is not pale.     Findings: No rash.  Neurological:     Mental Status: She is alert.     Coordination: Coordination normal.     Deep Tendon Reflexes: Reflexes are normal and symmetric. Reflexes normal.  Psychiatric:        Mood and Affect: Mood normal.           Assessment & Plan:   Problem List Items Addressed This Visit       Respiratory   Post-viral cough syndrome - Primary    Ongoing with scant clear mucous prod/worse at night and globus sensation in throat No improvement with prednisone/allergy tx/ H2 blocker  Reassuring exam  Will refer to ENT for further eval and tx        Relevant Orders   Ambulatory referral to ENT     Other   Decreased GFR    Reviewed labs  Last GFR 46.1 in pt with SLE and HTN and early diabetes  Taking losartan hct 50-12.5 mg daily  Rev fluid intake /importance for renal health  Continue urine testing with rheumatology Re check this in august  If not imp may need to change hctz inst to avoid nsaids       Globus sensation    No imp with allergy treatment or H2 blocker   Ref made to ENT      Relevant Orders   Ambulatory referral to ENT   Prediabetes

## 2022-04-19 NOTE — Patient Instructions (Addendum)
Let's check an a1c and kidney numbers in august   Don't get dehydrated ! Keep up good water intake   I placed a referral for ENT for your cough  If you don't get a call to set this up in 1-2 weeks please let us know   Try to get most of your carbohydrates from produce (with the exception of white potatoes)  Eat less bread/pasta/rice/snack foods/cereals/sweets and other items from the middle of the grocery store (processed carbs)    We may consider generic ozempic for blood sugar and obesity

## 2022-04-20 NOTE — Assessment & Plan Note (Addendum)
Reviewed labs  Last GFR 46.1 in pt with SLE and HTN and early diabetes  Taking losartan hct 50-12.5 mg daily  Rev fluid intake /importance for renal health  Continue urine testing with rheumatology Re check this in august  If not imp may need to change hctz inst to avoid nsaids

## 2022-04-20 NOTE — Assessment & Plan Note (Signed)
Ongoing with scant clear mucous prod/worse at night and globus sensation in throat No improvement with prednisone/allergy tx/ H2 blocker  Reassuring exam  Will refer to ENT for further eval and tx

## 2022-04-20 NOTE — Assessment & Plan Note (Signed)
No imp with allergy treatment or H2 blocker   Ref made to ENT

## 2022-05-02 ENCOUNTER — Emergency Department
Admission: EM | Admit: 2022-05-02 | Discharge: 2022-05-02 | Disposition: A | Discharge status: Home / Self Care | Payer: No Typology Code available for payment source | Attending: Emergency Medicine | Admitting: Emergency Medicine

## 2022-05-02 ENCOUNTER — Other Ambulatory Visit: Payer: Self-pay

## 2022-05-02 DIAGNOSIS — S61511A Laceration without foreign body of right wrist, initial encounter: Secondary | ICD-10-CM | POA: Insufficient documentation

## 2022-05-02 DIAGNOSIS — S6991XA Unspecified injury of right wrist, hand and finger(s), initial encounter: Secondary | ICD-10-CM | POA: Diagnosis present

## 2022-05-02 DIAGNOSIS — W25XXXA Contact with sharp glass, initial encounter: Secondary | ICD-10-CM | POA: Insufficient documentation

## 2022-05-02 DIAGNOSIS — Y990 Civilian activity done for income or pay: Secondary | ICD-10-CM | POA: Insufficient documentation

## 2022-05-02 MED ORDER — CEPHALEXIN 500 MG PO CAPS: 1000.0000 mg | ORAL_CAPSULE | Freq: Two times a day (BID) | ORAL | 0 refills | Status: DC

## 2022-05-02 MED ORDER — LIDOCAINE-EPINEPHRINE (PF) 1 %-1:200000 IJ SOLN
10.0000 mL | Freq: Once | INTRAMUSCULAR | Status: AC
  Administered 2022-05-02: 10 mL
  Filled 2022-05-02: qty 10

## 2022-05-02 NOTE — ED Notes (Signed)
Pt is filing worker's comp.   Jennifer Rose  Call attempted, no answer.

## 2022-05-02 NOTE — ED Triage Notes (Signed)
Pt states she was at work and put a glass globe down and it broke cutting her right wrist, bleeding controlled. Saline gauze applied.

## 2022-05-02 NOTE — ED Notes (Signed)
Dc ppw provided. Followup and RX information reviewed as applicable. pt declines VS at this time and provides verbal consent for dc at this time. Pt alert and oriented to lobby.

## 2022-05-02 NOTE — ED Provider Notes (Signed)
[facility] Provider Note  Patient Contact: 4:02 PM (approximate)   History   Laceration   HPI  Jennifer Rose is a 66 y.o. female who presents the emergency department complaining of a laceration to the right wrist.  Patient states that she was at work, was handling a glass globe when it shattered.  Patient states that she sustained 2 lacerations to the anterior wrist.  Bleeding is controlled at this time.  Patient is up-to-date on tetanus immunization.  Good range of motion to the wrist at this time.     Physical Exam   Triage Vital Signs: ED Triage Vitals [05/02/22 1458]  Enc Vitals Group     BP 136/67     Pulse Rate 77     Resp 18     Temp 98.4 F (36.9 C)     Temp Source Oral     SpO2 99 %     Weight      Height      Head Circumference      Peak Flow      Pain Score      Pain Loc      Pain Edu?      Excl. in [place]?     Most recent vital signs: Vitals:   05/02/22 1458  BP: 136/67  Pulse: 77  Resp: 18  Temp: 98.4 F (36.9 C)  SpO2: 99%     General: Alert and in no acute distress.   Cardiovascular:  Good peripheral perfusion Respiratory: Normal respiratory effort without tachypnea or retractions. Lungs CTAB. Musculoskeletal: Full range of motion to all extremities.  Visualization of the right wrist reveals 2 lacerations.  Patient has a relatively shallow laceration involving the upper portion of the subcutaneous tissue.  This measures approximately 4 cm in length.  Patient has a 5 cm laceration that is deeper into the subcutaneous tissue more medially..  Full range of motion to the wrist.  The entire depth of both lacerations is appreciated with no evidence of retained foreign body. Neurologic:  No gross focal neurologic deficits are appreciated.  Skin:   No rash noted Other:   ED Results / Procedures / Treatments   Labs (all labs ordered are listed, but only abnormal results are displayed) Labs Reviewed - No data to  display   EKG     RADIOLOGY    No results found.  PROCEDURES:  Critical Care performed: No  ..Laceration Repair  Date/Time: 05/02/2022 5:34 PM  Performed by: Darletta Moll, PA-C Authorized by: Darletta Moll, PA-C   Consent:    Consent obtained:  Verbal   Consent given by:  Patient   Risks discussed:  Infection and pain Universal protocol:    Procedure explained and questions answered to patient or proxy's satisfaction: yes     Immediately prior to procedure, a time out was called: yes     Patient identity confirmed:  Verbally with patient Anesthesia:    Anesthesia method:  Local infiltration   Local anesthetic:  Lidocaine 1% WITH epi Laceration details:    Location:  Shoulder/arm   Shoulder/arm location:  R lower arm   Length (cm):  4 Pre-procedure details:    Preparation:  Patient was prepped and draped in usual sterile fashion Exploration:    Limited defect created (wound extended): no     Hemostasis achieved with:  Direct pressure   Imaging outcome: foreign body not noted     Wound exploration: wound explored through  full range of motion and entire depth of wound visualized     Wound extent: no foreign bodies/material noted, no nerve damage noted and no tendon damage noted     Contaminated: no   Treatment:    Area cleansed with:  Povidone-iodine and saline   Amount of cleaning:  Standard   Irrigation solution:  Sterile saline   Irrigation volume:  500 ml   Irrigation method:  Syringe   Debridement:  None Skin repair:    Repair method:  Sutures   Suture size:  4-0   Suture material:  Nylon   Suture technique:  Running locked   Number of sutures:  1 (1 running interlock suture with 7 throws) Approximation:    Approximation:  Close Repair type:    Repair type:  Simple Post-procedure details:    Dressing:  Open (no dressing)   Procedure completion:  Tolerated well, no immediate complications    MEDICATIONS ORDERED IN  ED: Medications  lidocaine-EPINEPHrine (PF) (XYLOCAINE-EPINEPHrine) 1 %-1:200000 (PF) injection 10 mL (10 mLs Infiltration Given by Other 05/02/22 1659)     IMPRESSION / MDM / [place] / ED COURSE  I reviewed the triage vital signs and the nursing notes.                              Differential diagnosis includes, but is not limited to, wrist laceration, tendon injury, retained foreign body  Patient's presentation is most consistent with acute illness / injury with system symptoms.   Patient's diagnosis is consistent with wrist lacerations.  Patient presents to the emergency department 2 lacerations to the right wrist.  This occurred after a glass globe shattered.  Patient was up-to-date on tetanus immunization.  Lacerations were closed as described above.  Patient will be placed on antibiotics prophylactically.  Wound care instructions discussed with the patient.  Follow-up primary care in 7 days for suture removal..  Patient is given ED precautions to return to the ED for any worsening or new symptoms.        FINAL CLINICAL IMPRESSION(S) / ED DIAGNOSES   Final diagnoses:  Laceration of right wrist, initial encounter     Rx / DC Orders   ED Discharge Orders          Ordered    cephALEXin (KEFLEX) 500 MG capsule  2 times daily        05/02/22 1736             Note:  This document was prepared using Dragon voice recognition software and may include unintentional dictation errors.   Brynda Peon 05/02/22 1737    Rada Hay, MD 05/02/22 1759

## 2022-05-10 ENCOUNTER — Encounter: Payer: Self-pay | Admitting: Family Medicine

## 2022-05-10 ENCOUNTER — Ambulatory Visit (INDEPENDENT_AMBULATORY_CARE_PROVIDER_SITE_OTHER): Payer: BC Managed Care – PPO | Admitting: Family Medicine

## 2022-05-10 VITALS — BP 122/78 | HR 87 | Temp 97.3°F | Ht 64.5 in | Wt 205.0 lb

## 2022-05-10 DIAGNOSIS — Z4802 Encounter for removal of sutures: Secondary | ICD-10-CM | POA: Diagnosis not present

## 2022-05-10 MED ORDER — MYCOPHENOLATE MOFETIL 500 MG PO TABS: 500.0000 mg | ORAL_TABLET | Freq: Two times a day (BID) | ORAL | Status: DC

## 2022-05-10 NOTE — Patient Instructions (Signed)
Keep it covered if needed.  The steristips should gradually come off.  Trim the ends if needed.  Update me as needed.  Take care.  Glad to see you.

## 2022-05-10 NOTE — Progress Notes (Signed)
R wrist suture removal.  ER follow-up.  She had some glass break and that caused the laceration.  No fevers chills drainage or erythema.  Here for suture removal.  Feeling well otherwise.  Meds, vitals, and allergies reviewed.   ROS: Per HPI unless specifically indicated in ROS section   2 running sutures removed from the right wrist on the flexor side without complication.  No spreading erythema.  She still has good tissue apposition and Steri-Strips were applied bilaterally and the area was covered with roll gauze just to protect it in the meantime.  Tolerated well.  Distally neurovascular intact.

## 2022-05-13 DIAGNOSIS — Z4802 Encounter for removal of sutures: Secondary | ICD-10-CM | POA: Insufficient documentation

## 2022-05-13 NOTE — Assessment & Plan Note (Signed)
See above.  Tolerated well.  No residual suture retained.  Follow-up as needed.  Routine cautions given to patient.

## 2022-05-16 NOTE — Progress Notes (Signed)
[facility] Note  05/21/2022     CHIEF COMPLAINT Patient presents for Retina Follow Up   HISTORY OF PRESENT ILLNESS: Jennifer Rose is a 66 y.o. female who presents to the clinic today for:   HPI     Retina Follow Up   Patient presents with  Other.  In both eyes.  This started 7 weeks ago.  I, the attending physician,  performed the HPI with the patient and updated documentation appropriately.        Comments   Patient here for 7 weeks retina follow up for optic disc edema. Patient states vision doing pretty good. No eye pain.       Last edited by Bernarda Caffey, MD on 05/21/2022 12:19 PM.     Pt had an appt with Dr. Manuella Ghazi on the referral of Dr. Amil Amen, she states she has not gotten any results yet on bloodwork. she has a follow up appt with him in a couple weeks, pt is no longer on prednisone, she is still taking Plaquenil and Cellcept  Referring physician: Arnoldo Hooker MD [address], [place], [place] [address]  HISTORICAL INFORMATION:   Selected notes from the MEDICAL RECORD NUMBER Referred by Dr. Lucianne Lei for papilledema OD, on plaquenil for lupus LEE: 11/28/2021 BCVA OD: 20/30 OS: 20/20 Ocular Hx- papilledema, glaucoma suspect PMH- lupus,     CURRENT MEDICATIONS: Current Outpatient Medications (Ophthalmic Drugs)  Medication Sig   latanoprost (XALATAN) 0.005 % ophthalmic solution Place 1 drop into both eyes as directed.   No current facility-administered medications for this visit. (Ophthalmic Drugs)   Current Outpatient Medications (Other)  Medication Sig   amLODipine (NORVASC) 5 MG tablet TAKE 1 TABLET BY MOUTH ONCE A DAY   baclofen (LIORESAL) 10 MG tablet Take 0.5-1 tablets (5-10 mg total) by mouth 3 (three) times daily.   cholecalciferol (VITAMIN D) 1000 UNITS tablet Take 2,000 Units by mouth daily.   famotidine (PEPCID) 20 MG tablet Take 1 tablet (20 mg total) by mouth daily.   fexofenadine (ALLEGRA) 180 MG tablet  Take 180 mg by mouth daily as needed for allergies or rhinitis.   fluticasone (FLONASE) 50 MCG/ACT nasal spray Place 1-2 sprays into both nostrils daily.   hydroxychloroquine (PLAQUENIL) 200 MG tablet Take 200 mg by mouth 2 (two) times daily.   losartan-hydrochlorothiazide (HYZAAR) 50-12.5 MG tablet TAKE 1 TABLET BY MOUTH ONCE A DAY   meclizine (ANTIVERT) 12.5 MG tablet Take 12.5 mg by mouth 3 (three) times daily as needed.   mycophenolate (CELLCEPT) 500 MG tablet Take 1 tablet (500 mg total) by mouth 2 (two) times daily.   potassium chloride (KLOR-CON) 10 MEQ tablet Take 2 tablets (20 mEq total) by mouth daily.   No current facility-administered medications for this visit. (Other)   REVIEW OF SYSTEMS: ROS   Positive for: Endocrine, Cardiovascular, Eyes, Allergic/Imm Negative for: Constitutional, Gastrointestinal, Neurological, Skin, Genitourinary, Musculoskeletal, HENT, Respiratory, Psychiatric, Heme/Lymph Last edited by Theodore Demark, COA on 05/21/2022  7:38 AM.     ALLERGIES Allergies  Allergen Reactions   Mucinex Clear & Cool Day-Night     Hiccups    PAST MEDICAL HISTORY Past Medical History:  Diagnosis Date   Allergy    allergic rhinitis   Hypertension 03/22/2004   Lupus ([place])    Uterine fibroid 11/01   per GYN   Past Surgical History:  Procedure Laterality Date   BREAST CYST EXCISION Right 36 yrs ago   x2  ccy     ENDOMETRIAL ABLATION  04/03   polyps?DUB   FAMILY HISTORY Family History  Problem Relation Age of Onset   Blindness Mother    Stroke Mother    Diabetes Mother    Glaucoma Mother    Glaucoma Father    SOCIAL HISTORY Social History   Tobacco Use   Smoking status: Never   Smokeless tobacco: Never  Vaping Use   Vaping Use: Never used  Substance Use Topics   Alcohol use: No    Alcohol/week: 0.0 standard drinks of alcohol   Drug use: No       OPHTHALMIC EXAM:  Base Eye Exam     Visual Acuity (Snellen - Linear)       Right Left    Dist cc 20/20 20/20    Correction: Glasses         Tonometry (Tonopen, 7:37 AM)       Right Left   Pressure 16 12         Pupils       Dark Light Shape React APD   Right 3 2 Round Brisk None   Left 3 2 Round Brisk None         Visual Fields (Counting fingers)       Left Right    Full Full         Extraocular Movement       Right Left    Full, Ortho Full, Ortho         Neuro/Psych     Oriented x3: Yes   Mood/Affect: Normal         Dilation     Both eyes: 1.0% Mydriacyl, 2.5% Phenylephrine @ 7:36 AM           Slit Lamp and Fundus Exam     Slit Lamp Exam       Right Left   Lids/Lashes Dermatochalasis - upper lid Dermatochalasis - upper lid   Conjunctiva/Sclera mild melanosis mild melanosis   Cornea mild arcus, trace, fine endo pigment, 1+ inferior Punctate epithelial erosions Clear   Anterior Chamber Deep and clear Deep and clear   Iris Round and dilated, nevus at 0900 Round and dilated   Lens 2+ Nuclear sclerosis, 2+ Cortical cataract 2+ Nuclear sclerosis, 2+ Cortical cataract   Anterior Vitreous Vitreous syneresis, no cell or pigment Vitreous syneresis, no cell or pigment         Fundus Exam       Right Left   Disc 1+ disc edema with mild CWS superior and nasal rim -- persistent -- minimal improvement from prior, +obscuration of vessels, +heme - improving, +hyperemia nasally Pink and Sharp, focal PPP temporal, ?fine, vascular loops   C/D Ratio 0.0 0.1   Macula Flat, Good foveal reflex, scattered IRH greatest perifovea Flat, Good foveal reflex, rare punctate IRH   Vessels attenuated, Tortuous, Copper wiring, AV crossing changes attenuated, Tortuous, copper wiring   Periphery Attached, 360 IRH, cluster of larger DBH temporal periphery -- persistent Attached, scattered MA           Refraction     Wearing Rx       Sphere Cylinder Axis Add   Right -1.50 +0.25 038 +2.75   Left -0.75 +0.25 175 +2.75           IMAGING AND  PROCEDURES  Imaging and Procedures for 05/21/2022  OCT, Retina - OU - Both Eyes       Right Eye Quality was  good. Central Foveal Thickness: 280. Progression has been stable. Findings include normal foveal contour, no IRF, no SRF (Persistent disc edema, ?slight improvement, partial PVD).   Left Eye Quality was good. Central Foveal Thickness: 256. Progression has been stable. Findings include normal foveal contour, no IRF, no SRF (Partial PVD).   Notes *Images captured and stored on drive  Diagnosis / Impression:  OD: Persistent disc edema, ?slight improvement, partial PVD OS: NFP, no IRF/SRF No Plaquenil toxicity OU No DME  Clinical management:  See below  Abbreviations: NFP - Normal foveal profile. CME - cystoid macular edema. PED - pigment epithelial detachment. IRF - intraretinal fluid. SRF - subretinal fluid. EZ - ellipsoid zone. ERM - epiretinal membrane. ORA - outer retinal atrophy. ORT - outer retinal tubulation. SRHM - subretinal hyper-reflective material. IRHM - intraretinal hyper-reflective material            ASSESSMENT/PLAN:    ICD-10-CM   1. 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 Systemic lupus erythematosus with other organ involvement, unspecified SLE type ([place])  M32.19     3. Long-term use of Plaquenil  Z79.899     4. 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 2 diabetes mellitus ([place])  R42.7062     5. Essential hypertension  I10     6. Hypertensive retinopathy of both eyes  H35.033     7. Combined forms of age-related cataract of both eyes  H25.813       1. Optic disc edema OD  - initial exam showed +CWS on disc rim, obscuration of disc vessels, and +hemorrhages -- today, slightly improved on exam  - BCVA remains 20/20, no rAPD - first noted by Dr. Lucianne Lei on routine exam, pt asymptomatic - CT brain and orbits w and w/o contrast ordered by Dr. Lucianne Lei and imaged 2.16.23 -- no  retrobulbar or retro-orbital mass noted - FA 2.21.23 shows hyperfluorescent staining and leakage of disc OD - agree with Dr. 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 Amil Amen started pt on '40mg'$  po Prednisone ('20mg'$  BID) on March 10th - pt developed dizziness and medication was decreased to '10mg'$  in April  - exam and OCT today shows persistent disc edema and vascular congestion -- ?mild improvement - discussed possible benefit from additional immunomodulatory therapy -- pt was referred to Dr. Manuella Ghazi and started on Cellcept and additional lab investigations initiated - pt can f/u here PRN  2,3. 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 Mild nonproliferative diabetic retinopathy w/o DME, both eyes - The incidence, risk factors for progression, natural history and treatment options for diabetic retinopathy were discussed with patient.   - The need for close monitoring of blood glucose, blood pressure, and serum lipids, avoiding  cigarette or any type of tobacco, and the need for long term follow up was also discussed with patient. - exam shows very mild MA OU  - FA 02.21.23 with mild MA OU - OCT without diabetic macular edema, both eyes  - BCVA 20/20 OU - monitor  5,6. Hypertensive retinopathy OU - discussed importance of tight BP control - monitor  7.  Mixed Cataract OU - The symptoms of cataract, surgical options, and treatments and risks were discussed with patient. - discussed diagnosis and progression - not yet visually significant - monitor for now  Ophthalmic Meds Ordered this visit:  No orders of the defined types were placed in this encounter.    Return if symptoms worsen or fail to improve.  There are no Patient Instructions on file for this visit.   Explained the diagnoses, plan, and follow up with the patient and they expressed understanding.  Patient expressed understanding of the importance of proper follow up care.   This document serves as a record of services personally performed by Gardiner Sleeper, MD, PhD. It was created on their behalf by Roselee Nova, COMT. The creation of this record is the provider's dictation and/or activities during the visit.  Electronically signed by: Roselee Nova, COMT 05/21/22 12:20 PM  This document serves as a record of services personally performed by Gardiner Sleeper, MD, PhD. It was created on their behalf by San Jetty. Owens Shark, OA an ophthalmic technician. The creation of this record is the provider's dictation and/or activities during the visit.    Electronically signed by: San Jetty. Owens Shark, [place] 07.31.2023 12:20 PM  Gardiner Sleeper, M.D., Ph.D. Diseases & Surgery of the Retina and Vitreous [facility]  I have reviewed the above documentation for accuracy and completeness, and I agree with the above. Gardiner Sleeper, M.D., Ph.D. 05/21/22 12:27 PM  Abbreviations: M myopia (nearsighted); A astigmatism; H hyperopia (farsighted); P presbyopia; Mrx spectacle prescription;  CTL contact lenses; OD right eye; OS left eye; OU both eyes  XT exotropia; ET esotropia; PEK punctate epithelial keratitis; PEE punctate epithelial erosions; DES dry eye syndrome; MGD meibomian gland dysfunction; ATs artificial tears; PFAT's preservative free artificial tears; [place] nuclear sclerotic  cataract; PSC posterior subcapsular cataract; ERM epi-retinal membrane; PVD posterior vitreous detachment; RD retinal detachment; DM diabetes mellitus; DR diabetic retinopathy; NPDR non-proliferative diabetic retinopathy; PDR proliferative diabetic retinopathy; CSME clinically significant macular edema; DME diabetic macular edema; dbh dot blot hemorrhages; CWS cotton wool spot; POAG primary open angle glaucoma; C/D cup-to-disc ratio; HVF humphrey visual field; GVF goldmann visual field; OCT optical coherence tomography; IOP intraocular pressure; BRVO Branch retinal vein occlusion; CRVO central retinal vein occlusion; CRAO central retinal artery occlusion; BRAO branch retinal artery occlusion; RT retinal tear; SB scleral buckle; PPV pars plana vitrectomy; VH Vitreous hemorrhage; PRP panretinal laser photocoagulation; IVK intravitreal kenalog; VMT vitreomacular traction; MH Macular hole;  NVD neovascularization of the disc; NVE neovascularization elsewhere; AREDS age related eye disease study; ARMD age related macular degeneration; POAG primary open angle glaucoma; EBMD epithelial/anterior basement membrane dystrophy; ACIOL anterior chamber intraocular lens; IOL intraocular lens; PCIOL posterior chamber intraocular lens; Phaco/IOL phacoemulsification with intraocular lens placement; [place] photorefractive keratectomy; LASIK laser assisted in situ keratomileusis; HTN hypertension; DM diabetes mellitus; COPD chronic obstructive pulmonary disease

## 2022-05-21 ENCOUNTER — Encounter (INDEPENDENT_AMBULATORY_CARE_PROVIDER_SITE_OTHER): Payer: Self-pay | Admitting: Ophthalmology

## 2022-05-21 ENCOUNTER — Ambulatory Visit (INDEPENDENT_AMBULATORY_CARE_PROVIDER_SITE_OTHER): Payer: BC Managed Care – PPO | Admitting: Ophthalmology

## 2022-05-21 DIAGNOSIS — H35033 Hypertensive retinopathy, bilateral: Secondary | ICD-10-CM

## 2022-05-21 DIAGNOSIS — I10 Essential (primary) hypertension: Secondary | ICD-10-CM

## 2022-05-21 DIAGNOSIS — E113293 Type 2 diabetes mellitus with mild nonproliferative diabetic retinopathy without macular edema, bilateral: Secondary | ICD-10-CM | POA: Diagnosis not present

## 2022-05-21 DIAGNOSIS — M3219 Other organ or system involvement in systemic lupus erythematosus: Secondary | ICD-10-CM | POA: Diagnosis not present

## 2022-05-21 DIAGNOSIS — Z79899 Other long term (current) drug therapy: Secondary | ICD-10-CM

## 2022-05-21 DIAGNOSIS — H25813 Combined forms of age-related cataract, bilateral: Secondary | ICD-10-CM

## 2022-05-21 DIAGNOSIS — H4710 Unspecified papilledema: Secondary | ICD-10-CM | POA: Diagnosis not present

## 2022-05-29 ENCOUNTER — Other Ambulatory Visit: Payer: Self-pay | Admitting: Family Medicine

## 2022-06-05 ENCOUNTER — Telehealth: Payer: Self-pay | Admitting: Family Medicine

## 2022-06-05 DIAGNOSIS — D696 Thrombocytopenia, unspecified: Secondary | ICD-10-CM

## 2022-06-05 DIAGNOSIS — I10 Essential (primary) hypertension: Secondary | ICD-10-CM

## 2022-06-05 DIAGNOSIS — R7303 Prediabetes: Secondary | ICD-10-CM

## 2022-06-05 NOTE — Telephone Encounter (Signed)
-----   Message from Ellamae Sia sent at 05/21/2022  3:48 PM EDT ----- Regarding: Lab orders for Wednesday, 8.16.23 Lab orders, thanks

## 2022-06-06 ENCOUNTER — Other Ambulatory Visit (INDEPENDENT_AMBULATORY_CARE_PROVIDER_SITE_OTHER): Payer: BC Managed Care – PPO

## 2022-06-06 DIAGNOSIS — R7303 Prediabetes: Secondary | ICD-10-CM | POA: Diagnosis not present

## 2022-06-06 DIAGNOSIS — I10 Essential (primary) hypertension: Secondary | ICD-10-CM

## 2022-06-06 DIAGNOSIS — D696 Thrombocytopenia, unspecified: Secondary | ICD-10-CM | POA: Diagnosis not present

## 2022-06-06 LAB — CBC WITH DIFFERENTIAL/PLATELET
Basophils Absolute: 0 10*3/uL (ref 0.0–0.1)
Basophils Relative: 0.4 % (ref 0.0–3.0)
Eosinophils Absolute: 0.1 10*3/uL (ref 0.0–0.7)
Eosinophils Relative: 1.9 % (ref 0.0–5.0)
HCT: 37.3 % (ref 36.0–46.0)
Hemoglobin: 12.3 g/dL (ref 12.0–15.0)
Lymphocytes Relative: 14.5 % (ref 12.0–46.0)
Lymphs Abs: 0.5 10*3/uL — ABNORMAL LOW (ref 0.7–4.0)
MCHC: 33 g/dL (ref 30.0–36.0)
MCV: 79.9 fl (ref 78.0–100.0)
Monocytes Absolute: 0.4 10*3/uL (ref 0.1–1.0)
Monocytes Relative: 9.6 % (ref 3.0–12.0)
Neutro Abs: 2.8 10*3/uL (ref 1.4–7.7)
Neutrophils Relative %: 73.6 % (ref 43.0–77.0)
Platelets: 106 10*3/uL — ABNORMAL LOW (ref 150.0–400.0)
RBC: 4.66 Mil/uL (ref 3.87–5.11)
RDW: 12.5 % (ref 11.5–15.5)
WBC: 3.8 10*3/uL — ABNORMAL LOW (ref 4.0–10.5)

## 2022-06-06 LAB — COMPREHENSIVE METABOLIC PANEL
ALT: 16 U/L (ref 0–35)
AST: 20 U/L (ref 0–37)
Albumin: 3.8 g/dL (ref 3.5–5.2)
Alkaline Phosphatase: 77 U/L (ref 39–117)
BUN: 18 mg/dL (ref 6–23)
CO2: 25 mEq/L (ref 19–32)
Calcium: 9.3 mg/dL (ref 8.4–10.5)
Chloride: 104 mEq/L (ref 96–112)
Creatinine, Ser: 1.35 mg/dL — ABNORMAL HIGH (ref 0.40–1.20)
GFR: 41.62 mL/min — ABNORMAL LOW (ref >60.00)
Glucose, Bld: 93 mg/dL (ref 70–99)
Potassium: 3.5 mEq/L (ref 3.5–5.1)
Sodium: 138 mEq/L (ref 135–145)
Total Bilirubin: 0.4 mg/dL (ref 0.2–1.2)
Total Protein: 7.2 g/dL (ref 6.0–8.3)

## 2022-06-06 LAB — HEMOGLOBIN A1C: Hgb A1c MFr Bld: 6.3 % (ref 4.6–6.5)

## 2022-06-06 LAB — LIPID PANEL
Cholesterol: 145 mg/dL (ref 0–200)
HDL: 38.8 mg/dL — ABNORMAL LOW (ref >39.00)
LDL Cholesterol: 84 mg/dL (ref 0–99)
NonHDL: 106.05
Total CHOL/HDL Ratio: 4
Triglycerides: 109 mg/dL (ref 0.0–149.0)
VLDL: 21.8 mg/dL (ref 0.0–40.0)

## 2022-06-07 ENCOUNTER — Other Ambulatory Visit: Payer: Self-pay

## 2022-06-07 MED ORDER — LOSARTAN POTASSIUM 50 MG PO TABS: 50.0000 mg | ORAL_TABLET | Freq: Every day | ORAL | 1 refills | Status: DC

## 2022-06-19 ENCOUNTER — Other Ambulatory Visit: Payer: Self-pay | Admitting: Family Medicine

## 2022-06-19 NOTE — Telephone Encounter (Signed)
Last office visit 05/10/22 with Dr. Damita Dunnings for suture removal.  Last refilled 01/22/22 for #60 with no refills by Dr. Lorelei Pont.   Next appt:  07/17/22 with Dr. Glori Bickers for follow up and labs.

## 2022-07-03 DIAGNOSIS — K219 Gastro-esophageal reflux disease without esophagitis: Secondary | ICD-10-CM | POA: Insufficient documentation

## 2022-07-03 DIAGNOSIS — J392 Other diseases of pharynx: Secondary | ICD-10-CM | POA: Insufficient documentation

## 2022-07-11 ENCOUNTER — Encounter: Payer: Self-pay | Admitting: Family Medicine

## 2022-07-11 DIAGNOSIS — R058 Other specified cough: Secondary | ICD-10-CM

## 2022-07-11 DIAGNOSIS — R053 Chronic cough: Secondary | ICD-10-CM

## 2022-07-16 ENCOUNTER — Encounter: Payer: Self-pay | Admitting: Gastroenterology

## 2022-07-17 ENCOUNTER — Ambulatory Visit: Payer: BC Managed Care – PPO | Admitting: Family Medicine

## 2022-07-17 ENCOUNTER — Encounter: Payer: Self-pay | Admitting: Family Medicine

## 2022-07-17 VITALS — BP 122/80 | HR 71 | Temp 98.1°F | Ht 64.5 in | Wt 192.2 lb

## 2022-07-17 DIAGNOSIS — R944 Abnormal results of kidney function studies: Secondary | ICD-10-CM | POA: Diagnosis not present

## 2022-07-17 DIAGNOSIS — R058 Other specified cough: Secondary | ICD-10-CM

## 2022-07-17 DIAGNOSIS — I10 Essential (primary) hypertension: Secondary | ICD-10-CM | POA: Diagnosis not present

## 2022-07-17 LAB — BASIC METABOLIC PANEL
BUN: 19 mg/dL (ref 6–23)
CO2: 22 mEq/L (ref 19–32)
Calcium: 9.3 mg/dL (ref 8.4–10.5)
Chloride: 107 mEq/L (ref 96–112)
Creatinine, Ser: 1.23 mg/dL — ABNORMAL HIGH (ref 0.40–1.20)
GFR: 46.5 mL/min — ABNORMAL LOW (ref >60.00)
Glucose, Bld: 112 mg/dL — ABNORMAL HIGH (ref 70–99)
Potassium: 3.4 mEq/L — ABNORMAL LOW (ref 3.5–5.1)
Sodium: 139 mEq/L (ref 135–145)

## 2022-07-17 NOTE — Progress Notes (Signed)
Subjective:    Patient ID: Jennifer Rose, female    DOB: February 10, 1956, 66 y.o.   MRN: [id number]  HPI Pt presents for f/u of cough and chronic health problems  Wt Readings from Last 3 Encounters:  07/17/22 192 lb 3.2 oz (87.2 kg)  05/10/22 205 lb (93 kg)  04/19/22 205 lb 3.2 oz (93.1 kg)   32.48 kg/m  Lost weight  Not eating as much   Gets full fast- food does not taste the same  Eating causes more tickle in throat    Saw ENT for chronic cough  Had laryngoscopy which was reassuring  Had an incidental nasoph cyst   Dx with laryngopharyngeal reflux  A/p: Patient presents with chronic cough most likely secondary to postviral syndrome approximate 6 months ago. I recommended referral to laryngology for superior laryngeal nerve block on the left side as this is the patient's primary's laryngeal side of irritation. In addition I discussed that in the meantime she may benefit from a trial of proton pump inhibitor twice daily to address underlying occult LPRD.  Was ref to speech tx  Given px for prilosec to try-not changing symptoms   Has appt with GI - in early November   Cough is off and on - a little better than the past  Worse when she lay down at night    HTN bp is stable today  No cp or palpitations or headaches or edema  No side effects to medicines  BP Readings from Last 3 Encounters:  07/17/22 122/80  05/10/22 122/78  05/02/22 132/71     Changed from losartan hct to plain losartan due to elevated cr  No ankle swelling   Lab Results  Component Value Date   CREATININE 1.35 (H) 06/06/2022   BUN 18 06/06/2022   NA 138 06/06/2022   K 3.5 06/06/2022   CL 104 06/06/2022   CO2 25 06/06/2022   Has Lupus also  GFR 41.6   Drinks 6-7 big glasses of water daily  Some other fluids also   Lab Results  Component Value Date   HGBA1C 6.3 06/06/2022   Patient Active Problem List   Diagnosis Date Noted   Visit for suture removal 05/13/2022   Decreased GFR  04/19/2022   Globus sensation 04/19/2022   Hiccups 01/30/2022   Sore throat 01/11/2022   Colon cancer screening 11/12/2019   H/O migraine 08/26/2018   Routine general medical examination at a health care facility 08/26/2018   Hypokalemia 08/26/2018   Thrombocytopenia ([place]) 04/30/2017   Prediabetes 12/13/2014   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 36.9 in adult 12/13/2014   Cough 09/21/2010   GLAUCOMA 04/15/2009   LUPUS 11/09/2008   Post-viral cough syndrome 09/24/2007   Allergic rhinitis 09/24/2007   Essential hypertension 03/22/2004   Past Medical History:  Diagnosis Date   Allergy    allergic rhinitis   Hypertension 03/22/2004   Lupus ([place])    Uterine fibroid 11/01   per GYN   Past Surgical History:  Procedure Laterality Date   BREAST CYST EXCISION Right 36 yrs ago   x2   ccy     ENDOMETRIAL ABLATION  04/03   polyps?DUB   Social History   Tobacco Use   Smoking status: Never   Smokeless tobacco: Never  Vaping Use   Vaping Use: Never used  Substance Use Topics   Alcohol use: No    Alcohol/week: 0.0 standard drinks of  alcohol   Drug use: No   Family History  Problem Relation Age of Onset   Blindness Mother    Stroke Mother    Diabetes Mother    Glaucoma Mother    Glaucoma Father    Allergies  Allergen Reactions   Mucinex Clear & Cool Day-Night     Hiccups    Current Outpatient Medications on File Prior to Visit  Medication Sig Dispense Refill   amLODipine (NORVASC) 5 MG tablet TAKE 1 TABLET BY MOUTH ONCE A DAY 90 tablet 0   cholecalciferol (VITAMIN D) 1000 UNITS tablet Take 2,000 Units by mouth daily.     famotidine (PEPCID) 20 MG tablet Take 1 tablet (20 mg total) by mouth daily. 30 tablet 2   fexofenadine (ALLEGRA) 180 MG tablet Take 180 mg by mouth daily as needed for allergies or rhinitis.     fluticasone (FLONASE) 50 MCG/ACT nasal spray USE 1 TO 2 SPRAYS IN EACH NOSTRIL DAILY 16 g 5    hydroxychloroquine (PLAQUENIL) 200 MG tablet Take 200 mg by mouth 2 (two) times daily.     latanoprost (XALATAN) 0.005 % ophthalmic solution Place 1 drop into both eyes as directed.     losartan (COZAAR) 50 MG tablet Take 1 tablet (50 mg total) by mouth daily. 90 tablet 1   meclizine (ANTIVERT) 12.5 MG tablet Take 12.5 mg by mouth 3 (three) times daily as needed.     mycophenolate (CELLCEPT) 500 MG tablet Take 1 tablet (500 mg total) by mouth 2 (two) times daily.     omeprazole (PRILOSEC) 20 MG capsule Take 20 mg by mouth daily.     potassium chloride (KLOR-CON) 10 MEQ tablet Take 2 tablets (20 mEq total) by mouth daily. 180 tablet 3   baclofen (LIORESAL) 10 MG tablet TAKE 1/2 TO 1 TABLET BY MOUTH 3 TIMES DIALY 60 each 0   No current facility-administered medications on file prior to visit.    Review of Systems  Constitutional:  Negative for activity change, appetite change, fatigue, fever and unexpected weight change.  HENT:  Negative for congestion, ear pain, rhinorrhea, sinus pressure and sore throat.   Eyes:  Negative for pain, redness and visual disturbance.  Respiratory:  Positive for cough. Negative for choking, chest tightness, shortness of breath and wheezing.   Cardiovascular:  Negative for chest pain and palpitations.  Gastrointestinal:  Negative for abdominal pain, blood in stool, constipation and diarrhea.  Endocrine: Negative for polydipsia and polyuria.  Genitourinary:  Negative for dysuria, frequency and urgency.  Musculoskeletal:  Positive for arthralgias. Negative for back pain and myalgias.  Skin:  Negative for pallor and rash.  Allergic/Immunologic: Negative for environmental allergies.  Neurological:  Negative for dizziness, syncope and headaches.  Hematological:  Negative for adenopathy. Does not bruise/bleed easily.  Psychiatric/Behavioral:  Negative for decreased concentration and dysphoric mood. The patient is not nervous/anxious.        Objective:   Physical  Exam Constitutional:      General: She is not in acute distress.    Appearance: Normal appearance. She is well-developed. She is obese. She is not ill-appearing or diaphoretic.  HENT:     Head: Normocephalic and atraumatic.     Nose: Nose normal.     Mouth/Throat:     Mouth: Mucous membranes are moist.  Eyes:     General: No scleral icterus.    Conjunctiva/sclera: Conjunctivae normal.     Pupils: Pupils are equal, round, and reactive to light.  Neck:  Thyroid: No thyromegaly.     Vascular: No carotid bruit or JVD.  Cardiovascular:     Rate and Rhythm: Normal rate and regular rhythm.     Heart sounds: Normal heart sounds.     No gallop.  Pulmonary:     Effort: Pulmonary effort is normal. No respiratory distress.     Breath sounds: Normal breath sounds. No stridor. No wheezing, rhonchi or rales.  Abdominal:     General: There is no distension or abdominal bruit.     Palpations: Abdomen is soft.     Tenderness: There is no abdominal tenderness.  Musculoskeletal:     Cervical back: Normal range of motion and neck supple.     Right lower leg: No edema.     Left lower leg: No edema.  Lymphadenopathy:     Cervical: No cervical adenopathy.  Skin:    General: Skin is warm and dry.     Coloration: Skin is not pale.     Findings: No rash.  Neurological:     Mental Status: She is alert.     Cranial Nerves: No cranial nerve deficit.     Coordination: Coordination normal.     Deep Tendon Reflexes: Reflexes are normal and symmetric. Reflexes normal.  Psychiatric:        Mood and Affect: Mood normal.           Assessment & Plan:   Problem List Items Addressed This Visit       Cardiovascular and Mediastinum   Essential hypertension    bp in fair control at this time  BP Readings from Last 1 Encounters:  07/17/22 122/80  No changes needed Most recent labs reviewed  Disc lifstyle change with low sodium diet and exercise  Last visit d/c the hctz  Now taking losartan 50  mg daily by itself and bp control is good (also lost some wt) Lab today -hope for improved GFR      Relevant Orders   Basic metabolic panel     Respiratory   Post-viral cough syndrome    Reviewed ENT notes and laryngoscopy  Reassuring Having a trial of ppi /omeprazole 20 mg for poss GERD Unsure if helping Was ref for superior laryngeal nerve block on the L  Also speech tx   Will continue to follow         Other   Decreased GFR - Primary    Last check 41.6  Pt has HTN and lupus We d/c her hctz last visit  Takes losartan 50 mg and bp is well controlled Bmp today  Hope for improvement Very good water intake Avoids nsaids       Relevant Orders   Basic metabolic panel

## 2022-07-17 NOTE — Assessment & Plan Note (Signed)
bp in fair control at this time  BP Readings from Last 1 Encounters:  07/17/22 122/80   No changes needed Most recent labs reviewed  Disc lifstyle change with low sodium diet and exercise  Last visit d/c the hctz  Now taking losartan 50 mg daily by itself and bp control is good (also lost some wt) Lab today -hope for improved GFR

## 2022-07-17 NOTE — Assessment & Plan Note (Signed)
Reviewed ENT notes and laryngoscopy  Reassuring Having a trial of ppi /omeprazole 20 mg for poss GERD Unsure if helping Was ref for superior laryngeal nerve block on the L  Also speech tx   Will continue to follow

## 2022-07-17 NOTE — Patient Instructions (Signed)
Labs today   Take care of yourself   BP looks good

## 2022-07-17 NOTE — Assessment & Plan Note (Signed)
Last check 41.6  Pt has HTN and lupus We d/c her hctz last visit  Takes losartan 50 mg and bp is well controlled Bmp today  Hope for improvement Very good water intake Avoids nsaids

## 2022-07-25 ENCOUNTER — Other Ambulatory Visit: Payer: Self-pay | Admitting: Obstetrics and Gynecology

## 2022-07-25 DIAGNOSIS — Z1231 Encounter for screening mammogram for malignant neoplasm of breast: Secondary | ICD-10-CM

## 2022-08-23 ENCOUNTER — Ambulatory Visit (INDEPENDENT_AMBULATORY_CARE_PROVIDER_SITE_OTHER): Payer: BC Managed Care – PPO | Admitting: Gastroenterology

## 2022-08-23 ENCOUNTER — Encounter: Payer: Self-pay | Admitting: Gastroenterology

## 2022-08-23 VITALS — BP 136/78 | HR 84 | Ht 64.0 in | Wt 189.4 lb

## 2022-08-23 DIAGNOSIS — R09A2 Foreign body sensation, throat: Secondary | ICD-10-CM

## 2022-08-23 DIAGNOSIS — R634 Abnormal weight loss: Secondary | ICD-10-CM | POA: Diagnosis not present

## 2022-08-23 DIAGNOSIS — R053 Chronic cough: Secondary | ICD-10-CM

## 2022-08-23 DIAGNOSIS — R6881 Early satiety: Secondary | ICD-10-CM | POA: Diagnosis not present

## 2022-08-23 NOTE — Progress Notes (Signed)
HPI : Jennifer Rose is a very pleasant 66 year old female with a history of lupus and HTN who is referred to [place] by Dr. Loura Pardon for further evaluation of chronic cough.  The patient states that in March of this year she was diagnosed with a viral infection which was protracted and progressed to a bacterial sinus infection.  She developed a persistent cough sometime around April or May and the cough has not gone away.  The cough is described as a throat irritation and is associated with bringing up lots of clear phlegm.  It bothers her everyday.  It used to be very bothersome at night but this has improved.  It is noticed more after meals, but it did not seem to matter what she ate.  She denies having heartburn or acid regurgitation.  No dysphagia.  No abdominal pain. She does report getting full early and not eating as much.  She doesn't have pain or feel uncomfortable, she just gets full early.  She has lost about 30 lbs over the course of this year without meaning to.  Her weight is typically quite stable. She was referred to ENT for evaluation of persistent cough, who felt this most likely postviral in etiology and recommended a left laryngeal nerve block, as well as trial of twice daily omeprazole. She reports improvement in the cough and phlegm in terms of frequency and severity but her throat irritation is still present Since then she has also stopped drinking coffee and avoids peppermint and chocolate which she used to consume regularly.  She notes that food seems to taste different now.  She denies any prior history of GERD symptoms prior to this year.  She has never had an EGD. She had a colonoscopy in 2021 by Dr. Collene Mares in which 2 polyps were removed and she was recommended to repeat colonoscopy in 5 years.   Past Medical History:  Diagnosis Date   Allergy    allergic rhinitis   Eye pressure    GERD (gastroesophageal reflux disease)    Hypertension 03/22/2004   Lupus ([place])    Uterine  fibroid 08/22/2000   per GYN   Colonoscopy June 2021 (Dr. Collene Mares): One 7 mm polyp ascending colon, 1 small sigmoid polyp removed with forceps (histology results not available)  Past Surgical History:  Procedure Laterality Date   BREAST CYST EXCISION Right 36 yrs ago   x2   CESAREAN SECTION     x 3   CHOLECYSTECTOMY     ENDOMETRIAL ABLATION  01/2002   polyps?DUB   Family History  Problem Relation Age of Onset   Blindness Mother    Stroke Mother    Diabetes Mother    Glaucoma Mother    Hypertension Mother    Glaucoma Father    Diabetes Father    Hypertension Father    Prostate cancer Father    Stroke Sister    Diabetes Sister    Glaucoma Maternal Grandmother    Diabetes Maternal Grandmother    Hypertension Maternal Grandmother    Colon cancer Maternal Grandmother    Heart attack Maternal Grandfather    Glaucoma Paternal Grandmother    Heart disease Paternal Grandmother    Colitis Daughter    Hypertension Daughter    Diabetes Daughter    Social History   Tobacco Use   Smoking status: Never   Smokeless tobacco: Never  Vaping Use   Vaping Use: Never used  Substance Use Topics   Alcohol use:  No    Alcohol/week: 0.0 standard drinks of alcohol   Drug use: No   Current Outpatient Medications  Medication Sig Dispense Refill   amLODipine (NORVASC) 5 MG tablet TAKE 1 TABLET BY MOUTH ONCE A DAY 90 tablet 0   cholecalciferol (VITAMIN D) 1000 UNITS tablet Take 2,000 Units by mouth daily.     fexofenadine (ALLEGRA) 180 MG tablet Take 180 mg by mouth daily as needed for allergies or rhinitis.     fluticasone (FLONASE) 50 MCG/ACT nasal spray USE 1 TO 2 SPRAYS IN EACH NOSTRIL DAILY 16 g 5   hydroxychloroquine (PLAQUENIL) 200 MG tablet Take 200 mg by mouth 2 (two) times daily.     latanoprost (XALATAN) 0.005 % ophthalmic solution Place 1 drop into both eyes as directed.     losartan (COZAAR) 50 MG tablet Take 1 tablet (50 mg total) by mouth daily. 90 tablet 1   meclizine  (ANTIVERT) 12.5 MG tablet Take 12.5 mg by mouth 3 (three) times daily as needed.     mycophenolate (CELLCEPT) 500 MG tablet Take 1 tablet (500 mg total) by mouth 2 (two) times daily.     omeprazole (PRILOSEC) 20 MG capsule Take 20 mg by mouth daily.     potassium chloride (KLOR-CON) 10 MEQ tablet Take 2 tablets (20 mEq total) by mouth daily. 180 tablet 3   No current facility-administered medications for this visit.   Allergies  Allergen Reactions   Shrimp Extract Allergy Skin Test Shortness Of Breath    Throat swelling,   Guaifenesin Other (See Comments)    hiccups   Mucinex Clear & Cool Day-Night     Hiccups      Review of Systems: All systems reviewed and negative except where noted in HPI.    No results found.  Physical Exam: BP 136/78 (BP Location: Left Arm, Patient Position: Sitting, Cuff Size: Normal)   Pulse 84   Ht '5\' 4"'$  (1.626 m) Comment: height measured without shoes  Wt 189 lb 6 oz (85.9 kg)   BMI 32.51 kg/m  Constitutional: Pleasant,well-developed, African American female in no acute distress. HEENT: Normocephalic and atraumatic. Conjunctivae are normal. No scleral icterus. Neck supple.  Cardiovascular: Normal rate, regular rhythm.  Pulmonary/chest: Effort normal and breath sounds normal. No wheezing, rales or rhonchi. Abdominal: Soft, nondistended, nontender. Bowel sounds active throughout. There are no masses palpable. No hepatomegaly. Extremities: no edema Lymphadenopathy: No cervical adenopathy noted. Neurological: Alert and oriented to person place and time. Skin: Skin is warm and dry. No rashes noted. Psychiatric: Normal mood and affect. Behavior is normal.  CBC    Component Value Date/Time   WBC 3.8 (L) 06/06/2022 0740   RBC 4.66 06/06/2022 0740   HGB 12.3 06/06/2022 0740   HCT 37.3 06/06/2022 0740   PLT 106.0 (L) 06/06/2022 0740   MCV 79.9 06/06/2022 0740   MCH 26.6 09/24/2019 0838   MCHC 33.0 06/06/2022 0740   RDW 12.5 06/06/2022 0740    LYMPHSABS 0.5 (L) 06/06/2022 0740   MONOABS 0.4 06/06/2022 0740   EOSABS 0.1 06/06/2022 0740   BASOSABS 0.0 06/06/2022 0740    CMP     Component Value Date/Time   NA 139 07/17/2022 0854   K 3.4 (L) 07/17/2022 0854   CL 107 07/17/2022 0854   CO2 22 07/17/2022 0854   GLUCOSE 112 (H) 07/17/2022 0854   BUN 19 07/17/2022 0854   CREATININE 1.23 (H) 07/17/2022 0854   CREATININE 1.14 (H) 09/24/2019 0838   CALCIUM 9.3 07/17/2022  0854   PROT 7.2 06/06/2022 0740   ALBUMIN 3.8 06/06/2022 0740   AST 20 06/06/2022 0740   AST 36 09/24/2019 0838   ALT 16 06/06/2022 0740   ALT 37 09/24/2019 0838   ALKPHOS 77 06/06/2022 0740   BILITOT 0.4 06/06/2022 0740   BILITOT 0.4 09/24/2019 0838   GFRNONAA 52 (L) 09/24/2019 0838   GFRAA >60 09/24/2019 0838     ASSESSMENT AND PLAN: 66 year old female with several months of globus sensation and cough with clear phlegm, worse at night, partially improved with PPI.  No typical GERD symptoms and symptoms started following a viral syndrome.  Response to PPI/behavioral modifications and nocturnal predominance suggestive of GERD.  Other factors such as the dysgeusia and onset following a protracted viral illness more suggestive of a post viral cough. Given new onset of symptoms at her age and the associated concerning symptoms of early satiety and unintentional weight loss, an upper endoscopy is warranted to exclude malignancy. Patient is already engaging in recommended behavioral modifications to reduce GERD (dietary exclusions, avoiding late meals, elevating HOB). Continue BID omeprazole for now, consider decreasing dose on EGD findings.  Cough - Possible GERD - Continue omeprazole BID - Continue behavioral modifications - GERD to assess for evidence of GERD - If EGD normal and cough persists, can consider pH/impedance testing for further evaluation  Early satiety/unintentional weight loss - EGD  The details, risks (including bleeding, perforation,  infection, missed lesions, medication reactions and possible hospitalization or surgery if complications occur), benefits, and alternatives to EGD with possible biopsy and possible dilation were discussed with the patient and she consents to proceed.   Suhana Wilner E. Candis Schatz, MD [facility]   CC;  Tower, Wynelle Fanny, MD

## 2022-08-23 NOTE — Patient Instructions (Addendum)
_______________________________________________________  If you are age 64 or older, your body mass index should be between 23-30. Your Body mass index is 32.51 kg/m. If this is out of the aforementioned range listed, please consider follow up with your Primary Care Provider.  If you are age 35 or younger, your body mass index should be between 19-25. Your Body mass index is 32.51 kg/m. If this is out of the aformentioned range listed, please consider follow up with your Primary Care Provider.   Continue Omeprazole twice daily    You have been scheduled for an endoscopy. Please follow written instructions given to you at your visit today. If you use inhalers (even only as needed), please bring them with you on the day of your procedure.   The  GI providers would like to encourage you to use [facility] to communicate with providers for non-urgent requests or questions.  Due to long hold times on the telephone, sending your provider a message by [facility] may be a faster and more efficient way to get a response.  Please allow 48 business hours for a response.  Please remember that this is for non-urgent requests.   It was a pleasure to see you today!  Thank you for trusting me with your gastrointestinal care!    Scott E.Candis Schatz, MD

## 2022-08-29 ENCOUNTER — Ambulatory Visit
Admission: RE | Admit: 2022-08-29 | Discharge: 2022-08-29 | Disposition: A | Discharge status: Home / Self Care | Payer: BC Managed Care – PPO | Source: Ambulatory Visit | Attending: Obstetrics and Gynecology | Admitting: Obstetrics and Gynecology

## 2022-08-29 DIAGNOSIS — Z1231 Encounter for screening mammogram for malignant neoplasm of breast: Secondary | ICD-10-CM

## 2022-09-05 ENCOUNTER — Encounter: Payer: Self-pay | Admitting: Gastroenterology

## 2022-09-05 ENCOUNTER — Ambulatory Visit (AMBULATORY_SURGERY_CENTER): Payer: BC Managed Care – PPO | Admitting: Gastroenterology

## 2022-09-05 VITALS — BP 120/66 | HR 62 | Temp 96.6°F | Resp 12 | Ht 64.0 in | Wt 189.0 lb

## 2022-09-05 DIAGNOSIS — K2980 Duodenitis without bleeding: Secondary | ICD-10-CM

## 2022-09-05 DIAGNOSIS — K449 Diaphragmatic hernia without obstruction or gangrene: Secondary | ICD-10-CM | POA: Diagnosis not present

## 2022-09-05 DIAGNOSIS — K31A0 Gastric intestinal metaplasia, unspecified: Secondary | ICD-10-CM | POA: Diagnosis not present

## 2022-09-05 DIAGNOSIS — K2950 Unspecified chronic gastritis without bleeding: Secondary | ICD-10-CM

## 2022-09-05 DIAGNOSIS — R634 Abnormal weight loss: Secondary | ICD-10-CM

## 2022-09-05 MED ORDER — SODIUM CHLORIDE 0.9 % IV SOLN: 500.0000 mL | Freq: Once | INTRAVENOUS | Status: DC

## 2022-09-05 NOTE — Progress Notes (Signed)
Pt's states no medical or surgical changes since previsit or office visit. 

## 2022-09-05 NOTE — Progress Notes (Signed)
History and Physical Interval Note:  09/05/2022 9:52 AM  Jennifer Rose  has presented today for endoscopic procedure(s), with the diagnosis of  Encounter Diagnosis  Name Primary?   Loss of weight Yes  .  The various methods of evaluation and treatment have been discussed with the patient and/or family. After consideration of risks, benefits and other options for treatment, the patient has consented to  the endoscopic procedure(s).   The patient's history has been reviewed, patient examined, no change in status, stable for endoscopic procedure(s).  I have reviewed the patient's chart and labs.  Questions were answered to the patient's satisfaction.     Caleigha Zale E. Candis Schatz, MD [facility]

## 2022-09-05 NOTE — Progress Notes (Signed)
Vss nad trans to pacu °

## 2022-09-05 NOTE — Progress Notes (Signed)
Called to room to assist during endoscopic procedure.  Patient ID and intended procedure confirmed with present staff. Received instructions for my participation in the procedure from the performing physician.  

## 2022-09-05 NOTE — Op Note (Signed)
[place] Patient Name: Jennifer Rose Procedure Date: 09/05/2022 9:54 AM MRN: [id number] Endoscopist: Nicki Reaper E. Candis Schatz , MD, [id number] Age: 66 Referring MD:  Date of Birth: 12-18-1955 Gender: Female Account #: [id number] Procedure:                Upper GI endoscopy Indications:              Chronic cough, Early satiety, Weight loss Medicines:                Monitored Anesthesia Care Procedure:                Pre-Anesthesia Assessment:                           - Prior to the procedure, a History and Physical                            was performed, and patient medications and                            allergies were reviewed. The patient's tolerance of                            previous anesthesia was also reviewed. The risks                            and benefits of the procedure and the sedation                            options and risks were discussed with the patient.                            All questions were answered, and informed consent                            was obtained. Prior Anticoagulants: The patient has                            taken no anticoagulant or antiplatelet agents. ASA                            Grade Assessment: III - A patient with severe                            systemic disease. After reviewing the risks and                            benefits, the patient was deemed in satisfactory                            condition to undergo the procedure.                           After obtaining informed consent, the endoscope was  passed under direct vision. Throughout the                            procedure, the patient's blood pressure, pulse, and                            oxygen saturations were monitored continuously. The                            Endoscope was introduced through the mouth, and                            advanced to the second part of duodenum. The upper                            GI  endoscopy was accomplished without difficulty.                            The patient tolerated the procedure well. Scope In: Scope Out: Findings:                 The examined portions of the nasopharynx,                            oropharynx and larynx were normal.                           There were esophageal mucosal changes classified as                            Barrett's stage C5-M5 per Prague criteria present                            in the lower third of the esophagus (from 36 to 31                            cm). The maximum longitudinal extent of these                            mucosal changes was 5 cm in length. Mucosa was                            biopsied with a cold forceps for histology in 4                            quadrants at intervals of 2 cm at 31, 33 and 35 cm                            from the incisors. A total of 3 specimen bottles                            were sent to pathology. Estimated blood loss was  minimal.                           Copious frothy white sputum was found in the                            esophagus. The exam of the esophagus was otherwise                            normal.                           A 4 cm hiatal hernia was present.                           The exam of the stomach was otherwise normal.                           Patchy atrophic/inflamed mucosa was found in the                            duodenal bulb. Biopsies were taken with a cold                            forceps for histology. Estimated blood loss was                            minimal.                           The exam of the duodenum was otherwise normal. Complications:            No immediate complications. Estimated Blood Loss:     Estimated blood loss was minimal. Impression:               - The examined portions of the nasopharynx,                            oropharynx and larynx were normal.                           -  Esophageal mucosal changes classified as                            Barrett's stage C5-M5 per Prague criteria. Biopsied.                           - Copious frothy white sputum in the esophagus.                           - 4 cm hiatal hernia.                           - Duodenal mucosal atrophy/inflammation. Biopsied.                           -  Given the patient's anatomy and presumed                            Barrett's, it is likely the patient's cough is                            secondary to acid reflux. Recommendation:           - Patient has a contact number available for                            emergencies. The signs and symptoms of potential                            delayed complications were discussed with the                            patient. Return to normal activities tomorrow.                            Written discharge instructions were provided to the                            patient.                           - Resume previous diet.                           - Continue present medications.                           - Await pathology results.                           - Repeat upper endoscopy (date not yet determined)                            for surveillance based on pathology results.                           - Continue anti-reflux behavioral modifications                            (avoiding trigger foods, avoiding meals within 3                            hours of bedtime, elevating head of bed, etc).                           - Increase omeprazole to twice daily for 4 weeks to                            see if this improves symptoms. Elainah Rhyne E. Candis Schatz, MD 09/05/2022 10:34:35 AM This report has been signed electronically.

## 2022-09-05 NOTE — Patient Instructions (Addendum)
Read all of the handouts given to you by your recovery room nurse.  Resume all of your medicines today. Increase your omeprazole to twice daily for 1 month.  Take it on an Empty stomach.  See report for further details.  Change your diet as recommended.  YOU HAD AN ENDOSCOPIC PROCEDURE TODAY AT [facility]:   Refer to the procedure report that was given to you for any specific questions about what was found during the examination.  If the procedure report does not answer your questions, please call your gastroenterologist to clarify.  If you requested that your care partner not be given the details of your procedure findings, then the procedure report has been included in a sealed envelope for you to review at your convenience later.  YOU SHOULD EXPECT: Some feelings of bloating in the abdomen. Passage of more gas than usual.  Walking can help get rid of the air that was put into your GI tract during the procedure and reduce the bloating.   Please Note:  You might notice some irritation and congestion in your nose or some drainage.  This is from the oxygen used during your procedure.  There is no need for concern and it should clear up in a day or so.  SYMPTOMS TO REPORT IMMEDIATELY:   Following upper endoscopy (EGD)  Vomiting of blood or coffee ground material  New chest pain or pain under the shoulder blades  Painful or persistently difficult swallowing  New shortness of breath  Fever of 100F or higher  Black, tarry-looking stools  For urgent or emergent issues, a gastroenterologist can be reached at any hour by calling [phone]. Do not use MyChart messaging for urgent concerns.    DIET:  We do recommend a small meal at first, but then you may proceed to your regular diet.  Drink plenty of fluids but you should avoid alcoholic beverages for 24 hours.  ACTIVITY:  You should plan to take it easy for the rest of today and you should NOT DRIVE or use heavy machinery  until tomorrow (because of the sedation medicines used during the test).    FOLLOW UP: Our staff will call the number listed on your records the next business day following your procedure.  We will call around 7:15- 8:00 am to check on you and address any questions or concerns that you may have regarding the information given to you following your procedure. If we do not reach you, we will leave a message.     If any biopsies were taken you will be contacted by phone or by letter within the next 1-3 weeks.  Please call us at [phone] if you have not heard about the biopsies in 3 weeks.    SIGNATURES/CONFIDENTIALITY: You and/or your care partner have signed paperwork which will be entered into your electronic medical record.  These signatures attest to the fact that that the information above on your After Visit Summary has been reviewed and is understood.  Full responsibility of the confidentiality of this discharge information lies with you and/or your care-partner.

## 2022-09-06 ENCOUNTER — Telehealth: Payer: Self-pay

## 2022-09-06 NOTE — Telephone Encounter (Signed)
Attempted to reach patient for post-procedure f/u call. No answer and unable to leave message as voicemail box is full.

## 2022-09-11 NOTE — Progress Notes (Signed)
Ms. Lieber,  The biopsies of your duodenum showed inflammatory changes consistent with peptic duodenitis.  This is typically thought to be irritation of the duodenum related to stomach acid exposure or sometimes medications.  No precancerous changes were seen. The biopsies of your esophagus confirmed my suspicion for Barrett's esophagus.  No dysplasia was seen.  As discussed, this is a change in the lining of the esophagus that has been associated with an increased risk of esophageal cancer.  It is important to continue to take acid suppressing medications indefinitely.  It is recommended that patients with nondysplastic Barrett's esophagus undergo surveillance endoscopy every 3 to 5 years. I recommend we repeat an upper endoscopy for you in 3 years.  Please increase the dose of your omeprazole to twice daily for 4 weeks to see if this improves your cough and GERD symptoms.  If no improvement after 4 weeks, reduce dose back to once daily.  Follow-up with me in 6 to 8 weeks if still having bothersome symptoms or to discuss these biopsies further.

## 2022-09-17 ENCOUNTER — Encounter: Payer: Self-pay | Admitting: Family Medicine

## 2022-09-17 ENCOUNTER — Ambulatory Visit (INDEPENDENT_AMBULATORY_CARE_PROVIDER_SITE_OTHER): Payer: BC Managed Care – PPO | Admitting: Family Medicine

## 2022-09-17 VITALS — BP 128/66 | HR 79 | Temp 97.0°F | Ht 64.0 in | Wt 187.0 lb

## 2022-09-17 DIAGNOSIS — R053 Chronic cough: Secondary | ICD-10-CM | POA: Diagnosis not present

## 2022-09-17 DIAGNOSIS — K2270 Barrett's esophagus without dysplasia: Secondary | ICD-10-CM | POA: Diagnosis not present

## 2022-09-17 DIAGNOSIS — K2980 Duodenitis without bleeding: Secondary | ICD-10-CM

## 2022-09-17 DIAGNOSIS — J0100 Acute maxillary sinusitis, unspecified: Secondary | ICD-10-CM | POA: Diagnosis not present

## 2022-09-17 MED ORDER — AMOXICILLIN-POT CLAVULANATE 875-125 MG PO TABS: 1.0000 | ORAL_TABLET | Freq: Two times a day (BID) | ORAL | 0 refills | Status: DC

## 2022-09-17 NOTE — Assessment & Plan Note (Signed)
S/p uri last week  Tx with augmentin  Disc sympt care and ER precautions  Trial of saline ns for congestion  Update if not starting to improve in a week or if worsening   Handouts given

## 2022-09-17 NOTE — Progress Notes (Signed)
Subjective:    Patient ID: Jennifer Rose, female    DOB: 1956-07-21, 66 y.o.   MRN: [id number]  HPI Pt presents for sinus symptoms   Wt Readings from Last 3 Encounters:  09/17/22 187 lb (84.8 kg)  09/05/22 189 lb (85.7 kg)  08/23/22 189 lb 6 oz (85.9 kg)   32.10 kg/m  Started mid last week More congestion and blowing nose a lot  Dark color nasal drainage  Face hurts- in cheeks   Throat is cloggy feeling (also had EGD)   Ears are ok   No wheezing or sob  No fever    Did 2 covid tests last week-both negative    Her chronic cough is getting better with GERD treatment  Better not to talk a lot    Otc:  Is allergic to mucinex Uses flonase  Takes otc allegra    Had recent EGD for reflux and chronic cough Found peptic duodenitis and barrett's esophagus  Omep 20 mg bid   Patient Active Problem List   Diagnosis Date Noted   Peptic duodenitis 09/17/2022   Barrett's esophagus 09/17/2022   Visit for suture removal 05/13/2022   Decreased GFR 04/19/2022   Globus sensation 04/19/2022   Hiccups 01/30/2022   Sore throat 01/11/2022   Colon cancer screening 11/12/2019   H/O migraine 08/26/2018   Routine general medical examination at a health care facility 08/26/2018   Hypokalemia 08/26/2018   Thrombocytopenia ([place]) 04/30/2017   Prediabetes 12/13/2014   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 36.9 in adult 12/13/2014   Acute sinusitis 03/23/2013   Cough 09/21/2010   GLAUCOMA 04/15/2009   LUPUS 11/09/2008   Post-viral cough syndrome 09/24/2007   Allergic rhinitis 09/24/2007   Essential hypertension 03/22/2004   Past Medical History:  Diagnosis Date   Allergy    allergic rhinitis   Eye pressure    GERD (gastroesophageal reflux disease)    Hypertension 03/22/2004   Lupus ([place])    Uterine fibroid 08/22/2000   per GYN   Past Surgical History:  Procedure Laterality Date   BREAST CYST EXCISION Right 36 yrs  ago   x2   CESAREAN SECTION     x 3   CHOLECYSTECTOMY     ENDOMETRIAL ABLATION  01/2002   polyps?DUB   Social History   Tobacco Use   Smoking status: Never   Smokeless tobacco: Never  Vaping Use   Vaping Use: Never used  Substance Use Topics   Alcohol use: No    Alcohol/week: 0.0 standard drinks of alcohol   Drug use: No   Family History  Problem Relation Age of Onset   Blindness Mother    Stroke Mother    Diabetes Mother    Glaucoma Mother    Hypertension Mother    Glaucoma Father    Diabetes Father    Hypertension Father    Prostate cancer Father    Stroke Sister    Diabetes Sister    Glaucoma Maternal Grandmother    Diabetes Maternal Grandmother    Hypertension Maternal Grandmother    Colon cancer Maternal Grandmother    Heart attack Maternal Grandfather    Glaucoma Paternal Grandmother    Heart disease Paternal Grandmother    Colitis Daughter    Hypertension Daughter    Diabetes Daughter    Allergies  Allergen Reactions   Shrimp Extract Allergy Skin Test Shortness Of Breath    Throat swelling,  Guaifenesin Other (See Comments)    hiccups   Mucinex Clear & Cool Day-Night     Hiccups    Current Outpatient Medications on File Prior to Visit  Medication Sig Dispense Refill   amLODipine (NORVASC) 5 MG tablet TAKE 1 TABLET BY MOUTH ONCE A DAY 90 tablet 0   cholecalciferol (VITAMIN D) 1000 UNITS tablet Take 2,000 Units by mouth daily.     fexofenadine (ALLEGRA) 180 MG tablet Take 180 mg by mouth daily as needed for allergies or rhinitis.     fluticasone (FLONASE) 50 MCG/ACT nasal spray USE 1 TO 2 SPRAYS IN EACH NOSTRIL DAILY 16 g 5   hydroxychloroquine (PLAQUENIL) 200 MG tablet Take 200 mg by mouth 2 (two) times daily.     latanoprost (XALATAN) 0.005 % ophthalmic solution Place 1 drop into both eyes as directed.     losartan (COZAAR) 50 MG tablet Take 1 tablet (50 mg total) by mouth daily. 90 tablet 1   meclizine (ANTIVERT) 12.5 MG tablet Take 12.5 mg by  mouth 3 (three) times daily as needed.     mycophenolate (CELLCEPT) 500 MG tablet Take 1 tablet (500 mg total) by mouth 2 (two) times daily.     omeprazole (PRILOSEC) 20 MG capsule Take 20 mg by mouth daily.     potassium chloride (KLOR-CON) 10 MEQ tablet Take 2 tablets (20 mEq total) by mouth daily. 180 tablet 3   No current facility-administered medications on file prior to visit.      Review of Systems  Constitutional:  Positive for appetite change. Negative for fatigue and fever.  HENT:  Positive for congestion, postnasal drip, rhinorrhea, sinus pressure and sore throat. Negative for ear pain, nosebleeds and trouble swallowing.   Eyes:  Negative for pain, redness and itching.  Respiratory:  Positive for cough. Negative for shortness of breath and wheezing.   Cardiovascular:  Negative for chest pain.  Gastrointestinal:  Negative for abdominal pain, diarrhea, nausea and vomiting.  Endocrine: Negative for polyuria.  Genitourinary:  Negative for dysuria, frequency and urgency.  Musculoskeletal:  Negative for arthralgias and myalgias.  Allergic/Immunologic: Negative for immunocompromised state.  Neurological:  Positive for headaches. Negative for dizziness, tremors, syncope, weakness and numbness.  Hematological:  Negative for adenopathy. Does not bruise/bleed easily.  Psychiatric/Behavioral:  Negative for dysphoric mood. The patient is not nervous/anxious.        Objective:   Physical Exam Constitutional:      General: She is not in acute distress.    Appearance: Normal appearance. She is well-developed. She is obese. She is not ill-appearing or diaphoretic.  HENT:     Head: Normocephalic and atraumatic.     Comments: Bilateral maxillary and frontal sinus tenderness    Right Ear: Tympanic membrane, ear canal and external ear normal. There is no impacted cerumen.     Left Ear: Tympanic membrane, ear canal and external ear normal.     Ears:     Comments: Mild cerumen L canal     Nose: Congestion and rhinorrhea present.     Mouth/Throat:     Pharynx: Oropharynx is clear. No oropharyngeal exudate or posterior oropharyngeal erythema.     Comments: Clear pnd Eyes:     General:        Right eye: No discharge.        Left eye: No discharge.     Conjunctiva/sclera: Conjunctivae normal.     Pupils: Pupils are equal, round, and reactive to light.  Cardiovascular:  Rate and Rhythm: Normal rate and regular rhythm.  Pulmonary:     Effort: Pulmonary effort is normal. No respiratory distress.     Breath sounds: Normal breath sounds. No wheezing or rales.     Comments: Good air exch No rales or rhonchi Musculoskeletal:     Cervical back: Normal range of motion and neck supple.  Lymphadenopathy:     Cervical: No cervical adenopathy.  Skin:    General: Skin is warm and dry.     Findings: No rash.  Neurological:     Mental Status: She is alert.     Cranial Nerves: No cranial nerve deficit.     Coordination: Coordination normal.  Psychiatric:        Mood and Affect: Mood normal.           Assessment & Plan:   Problem List Items Addressed This Visit       Respiratory   Acute sinusitis    S/p uri last week  Tx with augmentin  Disc sympt care and ER precautions  Trial of saline ns for congestion  Update if not starting to improve in a week or if worsening   Handouts given       Relevant Medications   amoxicillin-clavulanate (AUGMENTIN) 875-125 MG tablet     Digestive   Barrett's esophagus    Under care of GI  Recent EGD      Peptic duodenitis - Primary    Per EGD Under care of GI

## 2022-09-17 NOTE — Assessment & Plan Note (Signed)
Under care of GI  Recent EGD

## 2022-09-17 NOTE — Assessment & Plan Note (Signed)
Thought to be due to GERD Had EGD and noted barretts   On omep 20 mg bid Under GI care

## 2022-09-17 NOTE — Assessment & Plan Note (Signed)
Per EGD Under care of GI

## 2022-09-17 NOTE — Patient Instructions (Addendum)
Continue current medicines  Add some nasal saline spray or netti pot to help flush your nose/sinuses out   Take augmentin as directed for the sinus infection   Get some rest  Drink fluids    Update if not starting to improve in a week or if worsening

## 2022-09-19 ENCOUNTER — Other Ambulatory Visit: Payer: Self-pay | Admitting: Family Medicine

## 2022-10-09 ENCOUNTER — Other Ambulatory Visit: Payer: Self-pay

## 2022-10-09 ENCOUNTER — Telehealth: Payer: Self-pay | Admitting: Gastroenterology

## 2022-10-09 MED ORDER — OMEPRAZOLE 20 MG PO CPDR: 20.0000 mg | DELAYED_RELEASE_CAPSULE | Freq: Two times a day (BID) | ORAL | 3 refills | Status: DC

## 2022-10-09 NOTE — Telephone Encounter (Signed)
Refill sent to patients pharmacy. 

## 2022-10-09 NOTE — Telephone Encounter (Signed)
Patient Is calling requesting refill on Prilosec Rx. Please advise

## 2022-11-30 ENCOUNTER — Telehealth: Payer: Self-pay

## 2022-11-30 NOTE — Telephone Encounter (Signed)
[facility] Night - Client Nonclinical Telephone Record  [facility] Client [facility] Night - Client Client Site [place] Provider Loura Pardon - MD Contact Type Call Who Is Calling Patient / Member / Family / Caregiver Caller Name Yolunda Cheves Caller Phone Number [phone] Patient Name Jennifer Rose Patient DOB 04/08/1956 Call Type Message Only Information Provided Reason for Call Request to Schedule Office Appointment Initial Comment Caller states she is needing scheduling information, no triage. General information hours provided Patient request to speak to RN No Disp. Time Disposition Final User 11/30/2022 8:07:31 AM General Information Provided Yes Domingo Dimes, [place] Call Closed By: Memory Argue Transaction Date/Time: 11/30/2022 8:05:41 AM (ET  Sending to [facility] support

## 2022-11-30 NOTE — Telephone Encounter (Signed)
Patient called in this morning and scheduled an appointment.

## 2022-12-03 ENCOUNTER — Encounter: Payer: Self-pay | Admitting: Family Medicine

## 2022-12-03 ENCOUNTER — Ambulatory Visit: Payer: BC Managed Care – PPO | Admitting: Family Medicine

## 2022-12-03 VITALS — BP 143/80 | HR 74 | Temp 97.7°F | Ht 64.0 in | Wt 181.4 lb

## 2022-12-03 DIAGNOSIS — I10 Essential (primary) hypertension: Secondary | ICD-10-CM | POA: Diagnosis not present

## 2022-12-03 DIAGNOSIS — M3219 Other organ or system involvement in systemic lupus erythematosus: Secondary | ICD-10-CM

## 2022-12-03 DIAGNOSIS — J0100 Acute maxillary sinusitis, unspecified: Secondary | ICD-10-CM

## 2022-12-03 MED ORDER — AMOXICILLIN-POT CLAVULANATE 875-125 MG PO TABS: 1.0000 | ORAL_TABLET | Freq: Two times a day (BID) | ORAL | 0 refills | Status: DC

## 2022-12-03 NOTE — Assessment & Plan Note (Signed)
S/p uri with facial pain and purulent nasal drainage Px augmentin  Disc symptom control- saline ns  Continues flonase   Currently taking cellcept and plaquenil

## 2022-12-03 NOTE — Patient Instructions (Signed)
Drink fluids  Take the augmentin as directed for sinus infection   Try some nasal saline if congested Continue flonase  Let us know if not improving

## 2022-12-03 NOTE — Progress Notes (Signed)
Subjective:    Patient ID: Jennifer Rose, female    DOB: 06/26/1956, 67 y.o.   MRN: [id number]  HPI Pt presents with sinus symptoms Now on cellcept for eye problem from lupus Does not check bp at home  Wt Readings from Last 3 Encounters:  12/03/22 181 lb 6 oz (82.3 kg)  09/17/22 187 lb (84.8 kg)  09/05/22 189 lb (85.7 kg)   31.13 kg/m  Vitals:   12/03/22 0823  BP: (!) 162/94  Pulse: 74  Temp: 97.7 F (36.5 C)  SpO2: 100%  Bp better 2nd check   BP: (!) 143/80   Feels like a sinus infx Hurts in cheeks = worse on R side  Also forehead  Nagging HA  Since last Monday - over a week    Mucous is dark yellow  No fever  Throat is a little sore in the am   No wheeze No sob   No otc meds   Uses flonase daily    She has fair amt of mucous in throat but not much cough (was dx with Barretts)    Takes cellcept for optic nerve swelling-this is helping   Patient Active Problem List   Diagnosis Date Noted   Peptic duodenitis 09/17/2022   Barrett's esophagus 09/17/2022   Decreased GFR 04/19/2022   Hiccups 01/30/2022   Colon cancer screening 11/12/2019   H/O migraine 08/26/2018   Routine general medical examination at a health care facility 08/26/2018   Hypokalemia 08/26/2018   Thrombocytopenia ([place]) 04/30/2017   Prediabetes 12/13/2014   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 36.9 in adult 12/13/2014   Acute sinusitis 03/23/2013   GLAUCOMA 04/15/2009   LUPUS 11/09/2008   Chronic cough 09/24/2007   Allergic rhinitis 09/24/2007   Essential hypertension 03/22/2004   Past Medical History:  Diagnosis Date   Allergy    allergic rhinitis   Eye pressure    GERD (gastroesophageal reflux disease)    Hypertension 03/22/2004   Lupus ([place])    Uterine fibroid 08/22/2000   per GYN   Past Surgical History:  Procedure Laterality Date   BREAST CYST EXCISION Right 36 yrs ago   x2   CESAREAN SECTION     x 3    CHOLECYSTECTOMY     ENDOMETRIAL ABLATION  01/2002   polyps?DUB   Social History   Tobacco Use   Smoking status: Never   Smokeless tobacco: Never  Vaping Use   Vaping Use: Never used  Substance Use Topics   Alcohol use: No    Alcohol/week: 0.0 standard drinks of alcohol   Drug use: No   Family History  Problem Relation Age of Onset   Blindness Mother    Stroke Mother    Diabetes Mother    Glaucoma Mother    Hypertension Mother    Glaucoma Father    Diabetes Father    Hypertension Father    Prostate cancer Father    Stroke Sister    Diabetes Sister    Glaucoma Maternal Grandmother    Diabetes Maternal Grandmother    Hypertension Maternal Grandmother    Colon cancer Maternal Grandmother    Heart attack Maternal Grandfather    Glaucoma Paternal Grandmother    Heart disease Paternal Grandmother    Colitis Daughter    Hypertension Daughter    Diabetes Daughter    Allergies  Allergen Reactions   Shrimp Extract Allergy Skin Test Shortness Of Breath  Throat swelling,   Guaifenesin Other (See Comments)    hiccups   Mucinex Clear & Cool Day-Night     Hiccups    Current Outpatient Medications on File Prior to Visit  Medication Sig Dispense Refill   amLODipine (NORVASC) 5 MG tablet TAKE 1 TABLET BY MOUTH ONCE A DAY 90 tablet 1   cholecalciferol (VITAMIN D) 1000 UNITS tablet Take 2,000 Units by mouth daily.     fexofenadine (ALLEGRA) 180 MG tablet Take 180 mg by mouth daily as needed for allergies or rhinitis.     fluticasone (FLONASE) 50 MCG/ACT nasal spray USE 1 TO 2 SPRAYS IN EACH NOSTRIL DAILY 16 g 5   hydroxychloroquine (PLAQUENIL) 200 MG tablet Take 200 mg by mouth 2 (two) times daily.     latanoprost (XALATAN) 0.005 % ophthalmic solution Place 1 drop into both eyes as directed.     losartan (COZAAR) 50 MG tablet Take 1 tablet (50 mg total) by mouth daily. 90 tablet 1   meclizine (ANTIVERT) 12.5 MG tablet Take 12.5 mg by mouth 3 (three) times daily as needed.      mycophenolate (CELLCEPT) 500 MG tablet Take 1 tablet (500 mg total) by mouth 2 (two) times daily.     omeprazole (PRILOSEC) 20 MG capsule Take 1 capsule (20 mg total) by mouth 2 (two) times daily before a meal. 30 capsule 3   potassium chloride (KLOR-CON) 10 MEQ tablet Take 2 tablets (20 mEq total) by mouth daily. 180 tablet 3   No current facility-administered medications on file prior to visit.    Review of Systems  Constitutional:  Positive for appetite change. Negative for fatigue and fever.  HENT:  Positive for congestion, ear pain, postnasal drip, rhinorrhea, sinus pressure and sore throat. Negative for nosebleeds.   Eyes:  Negative for pain, redness and itching.       Vision is improving  Respiratory:  Positive for cough. Negative for shortness of breath and wheezing.   Cardiovascular:  Negative for chest pain.  Gastrointestinal:  Negative for abdominal pain, diarrhea, nausea and vomiting.  Endocrine: Negative for polyuria.  Genitourinary:  Negative for dysuria, frequency and urgency.  Musculoskeletal:  Negative for arthralgias and myalgias.  Allergic/Immunologic: Negative for immunocompromised state.  Neurological:  Positive for headaches. Negative for dizziness, tremors, syncope, weakness and numbness.  Hematological:  Negative for adenopathy. Does not bruise/bleed easily.  Psychiatric/Behavioral:  Negative for dysphoric mood. The patient is not nervous/anxious.        Objective:   Physical Exam Constitutional:      General: She is not in acute distress.    Appearance: Normal appearance. She is well-developed. She is obese. She is not ill-appearing.  HENT:     Head: Normocephalic and atraumatic.     Comments: Bilateral maxillary and frontal sinus tenderness    Right Ear: Tympanic membrane and external ear normal.     Left Ear: Tympanic membrane and external ear normal.     Nose: Congestion and rhinorrhea present.     Mouth/Throat:     Pharynx: Oropharynx is clear. No  oropharyngeal exudate or posterior oropharyngeal erythema.     Comments: Clear pnd Eyes:     General:        Right eye: No discharge.        Left eye: No discharge.     Conjunctiva/sclera: Conjunctivae normal.     Pupils: Pupils are equal, round, and reactive to light.  Cardiovascular:     Rate and Rhythm: Normal  rate and regular rhythm.  Pulmonary:     Effort: Pulmonary effort is normal. No respiratory distress.     Breath sounds: Normal breath sounds. No wheezing or rales.     Comments: Good air exch No rales or rhonchi Musculoskeletal:     Cervical back: Normal range of motion and neck supple.  Lymphadenopathy:     Cervical: No cervical adenopathy.  Skin:    General: Skin is warm and dry.     Findings: No rash.  Neurological:     Mental Status: She is alert.     Cranial Nerves: No cranial nerve deficit.     Coordination: Coordination normal.  Psychiatric:        Mood and Affect: Mood normal.           Assessment & Plan:   Problem List Items Addressed This Visit       Cardiovascular and Mediastinum   Essential hypertension    bp in fair control at this time  BP Readings from Last 1 Encounters:  12/03/22 (!) 143/80  Pt has sinus headache today, bp was better on 2nd check and will continue to monitor  Most recent labs reviewed  Disc lifstyle change with low sodium diet and exercise  Losartan 50 mg daily  No longer on hctz         Respiratory   Acute sinusitis - Primary    S/p uri with facial pain and purulent nasal drainage Px augmentin  Disc symptom control- saline ns  Continues flonase   Currently taking cellcept and plaquenil       Relevant Medications   amoxicillin-clavulanate (AUGMENTIN) 875-125 MG tablet     Other   LUPUS    Most recently affecting optic nerve  Dx from oph:  Visit Diagnoses Diagnosis  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

## 2022-12-03 NOTE — Assessment & Plan Note (Signed)
bp in fair control at this time  BP Readings from Last 1 Encounters:  12/03/22 (!) 143/80   Pt has sinus headache today, bp was better on 2nd check and will continue to monitor  Most recent labs reviewed  Disc lifstyle change with low sodium diet and exercise  Losartan 50 mg daily  No longer on hctz

## 2022-12-03 NOTE — Assessment & Plan Note (Addendum)
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

## 2022-12-14 ENCOUNTER — Telehealth: Payer: Self-pay | Admitting: Family Medicine

## 2022-12-14 MED ORDER — LOSARTAN POTASSIUM 50 MG PO TABS: 50.0000 mg | ORAL_TABLET | Freq: Every day | ORAL | 1 refills | Status: DC

## 2022-12-14 NOTE — Telephone Encounter (Signed)
Prescription Request  12/14/2022  Is this a "Controlled Substance" medicine? No  LOV: 12/03/2022  What is the name of the medication or equipment? losartan (COZAAR) 50 MG tablet   Have you contacted your pharmacy to request a refill? No   Which pharmacy would you like this sent to?  [facility], [place] [place] [address] Phone: [phone] Fax: [phone]    Patient notified that their request is being sent to the clinical staff for review and that they should receive a response within 2 business days.   Please advise at losartan (COZAAR) 50 MG tablet  [id number]

## 2023-02-18 ENCOUNTER — Ambulatory Visit: Payer: BC Managed Care – PPO | Admitting: Family Medicine

## 2023-02-18 ENCOUNTER — Encounter: Payer: Self-pay | Admitting: Family Medicine

## 2023-02-18 VITALS — BP 136/77 | HR 65 | Temp 98.0°F | Ht 64.0 in | Wt 183.1 lb

## 2023-02-18 DIAGNOSIS — D696 Thrombocytopenia, unspecified: Secondary | ICD-10-CM

## 2023-02-18 DIAGNOSIS — R4189 Other symptoms and signs involving cognitive functions and awareness: Secondary | ICD-10-CM | POA: Diagnosis not present

## 2023-02-18 DIAGNOSIS — R7303 Prediabetes: Secondary | ICD-10-CM

## 2023-02-18 DIAGNOSIS — G3184 Mild cognitive impairment, so stated: Secondary | ICD-10-CM | POA: Insufficient documentation

## 2023-02-18 DIAGNOSIS — I10 Essential (primary) hypertension: Secondary | ICD-10-CM

## 2023-02-18 DIAGNOSIS — R413 Other amnesia: Secondary | ICD-10-CM

## 2023-02-18 DIAGNOSIS — E113293 Type 2 diabetes mellitus with mild nonproliferative diabetic retinopathy without macular edema, bilateral: Secondary | ICD-10-CM

## 2023-02-18 LAB — CBC WITH DIFFERENTIAL/PLATELET
Basophils Absolute: 0 10*3/uL (ref 0.0–0.1)
Basophils Relative: 0.8 % (ref 0.0–3.0)
Eosinophils Absolute: 0.1 10*3/uL (ref 0.0–0.7)
Eosinophils Relative: 1.6 % (ref 0.0–5.0)
HCT: 38 % (ref 36.0–46.0)
Hemoglobin: 12.4 g/dL (ref 12.0–15.0)
Lymphocytes Relative: 10.4 % — ABNORMAL LOW (ref 12.0–46.0)
Lymphs Abs: 0.5 10*3/uL — ABNORMAL LOW (ref 0.7–4.0)
MCHC: 32.7 g/dL (ref 30.0–36.0)
MCV: 79.4 fl (ref 78.0–100.0)
Monocytes Absolute: 0.5 10*3/uL (ref 0.1–1.0)
Monocytes Relative: 11.1 % (ref 3.0–12.0)
Neutro Abs: 3.5 10*3/uL (ref 1.4–7.7)
Neutrophils Relative %: 76.1 % (ref 43.0–77.0)
Platelets: 98 10*3/uL — ABNORMAL LOW (ref 150.0–400.0)
RBC: 4.79 Mil/uL (ref 3.87–5.11)
RDW: 13.1 % (ref 11.5–15.5)
WBC: 4.6 10*3/uL (ref 4.0–10.5)

## 2023-02-18 LAB — COMPREHENSIVE METABOLIC PANEL
ALT: 10 U/L (ref 0–35)
AST: 16 U/L (ref 0–37)
Albumin: 3.9 g/dL (ref 3.5–5.2)
Alkaline Phosphatase: 105 U/L (ref 39–117)
BUN: 21 mg/dL (ref 6–23)
CO2: 26 mEq/L (ref 19–32)
Calcium: 9.6 mg/dL (ref 8.4–10.5)
Chloride: 106 mEq/L (ref 96–112)
Creatinine, Ser: 1.24 mg/dL — ABNORMAL HIGH (ref 0.40–1.20)
GFR: 45.86 mL/min — ABNORMAL LOW (ref >60.00)
Glucose, Bld: 81 mg/dL (ref 70–99)
Potassium: 4.2 mEq/L (ref 3.5–5.1)
Sodium: 142 mEq/L (ref 135–145)
Total Bilirubin: 0.4 mg/dL (ref 0.2–1.2)
Total Protein: 6.9 g/dL (ref 6.0–8.3)

## 2023-02-18 LAB — HEMOGLOBIN A1C: Hgb A1c MFr Bld: 5.7 % (ref 4.6–6.5)

## 2023-02-18 LAB — VITAMIN D 25 HYDROXY (VIT D DEFICIENCY, FRACTURES): VITD: 32.83 ng/mL (ref 30.00–100.00)

## 2023-02-18 LAB — VITAMIN B12: Vitamin B-12: 276 pg/mL (ref 211–911)

## 2023-02-18 LAB — TSH: TSH: 2.14 u[IU]/mL (ref 0.35–5.50)

## 2023-02-18 IMAGING — MG MM DIGITAL SCREENING BILAT W/ TOMO AND CAD
8 series · 8 of 24 positions shown · non-contrast
Comparison: Previous exam(s).

CLINICAL DATA: Screening.

EXAM:
DIGITAL SCREENING BILATERAL MAMMOGRAM WITH TOMOSYNTHESIS AND CAD
TECHNIQUE: Bilateral screening digital craniocaudal and mediolateral oblique
mammograms were obtained. Bilateral screening digital breast
tomosynthesis was performed. The images were evaluated with
computer-aided detection.

[L CC synth-2D]
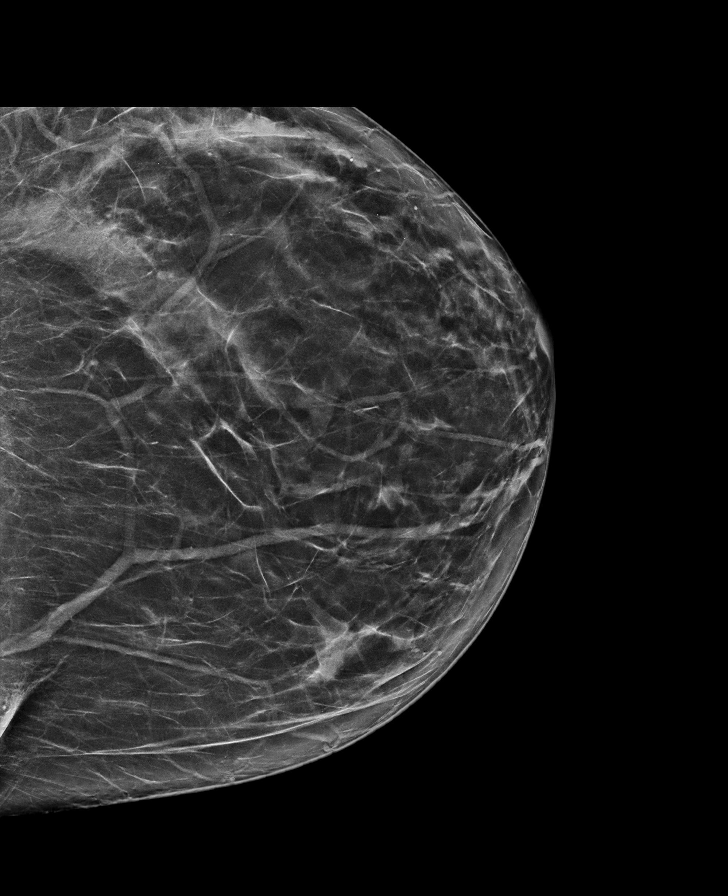

[R CC synth-2D]
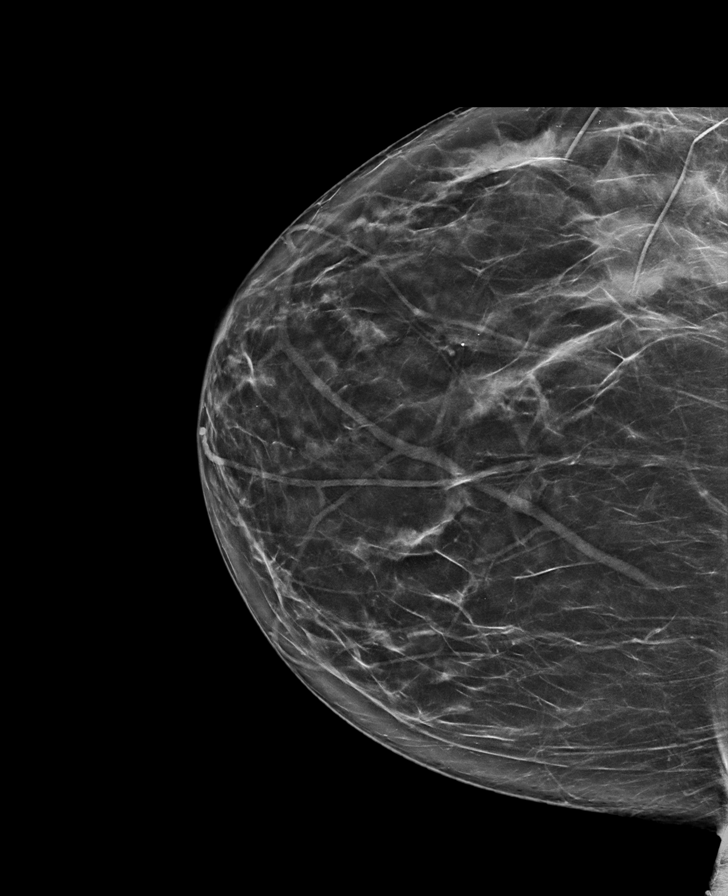

[R MLO synth-2D]
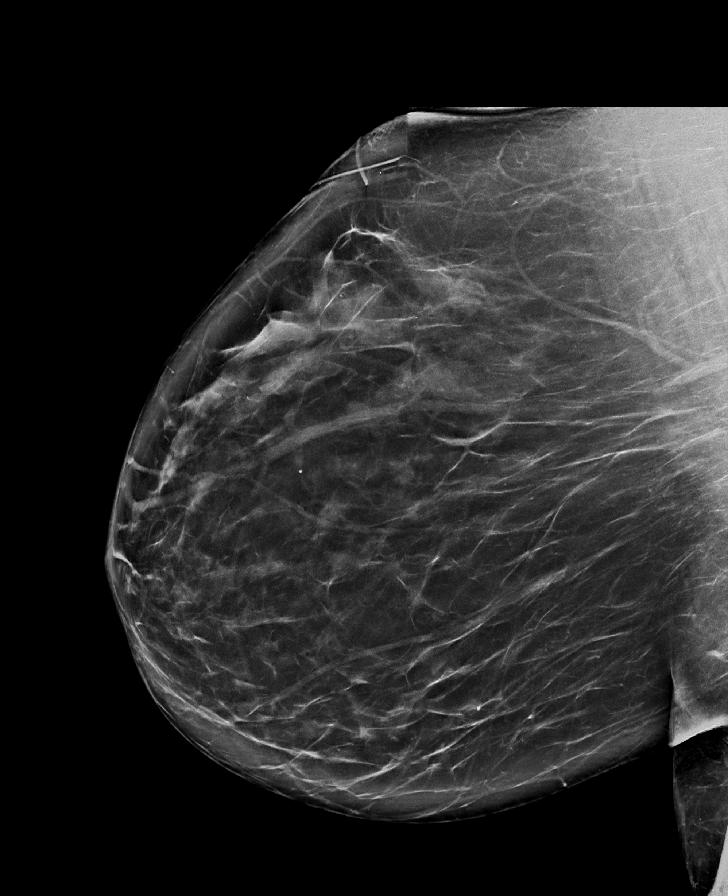

[L MLO synth-2D]
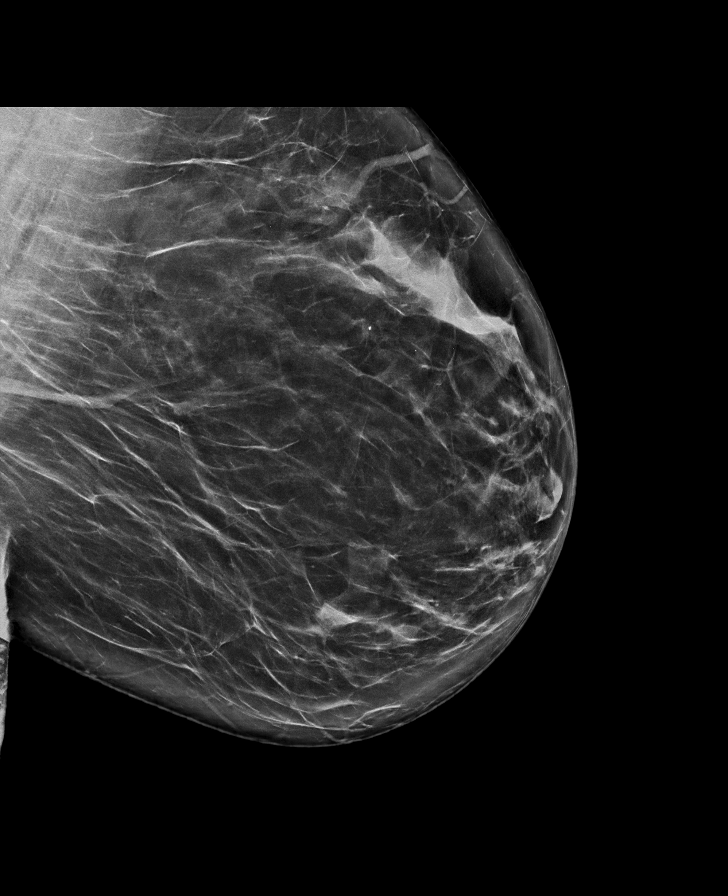

[R CC tomo · tomo slice 39/78.0]
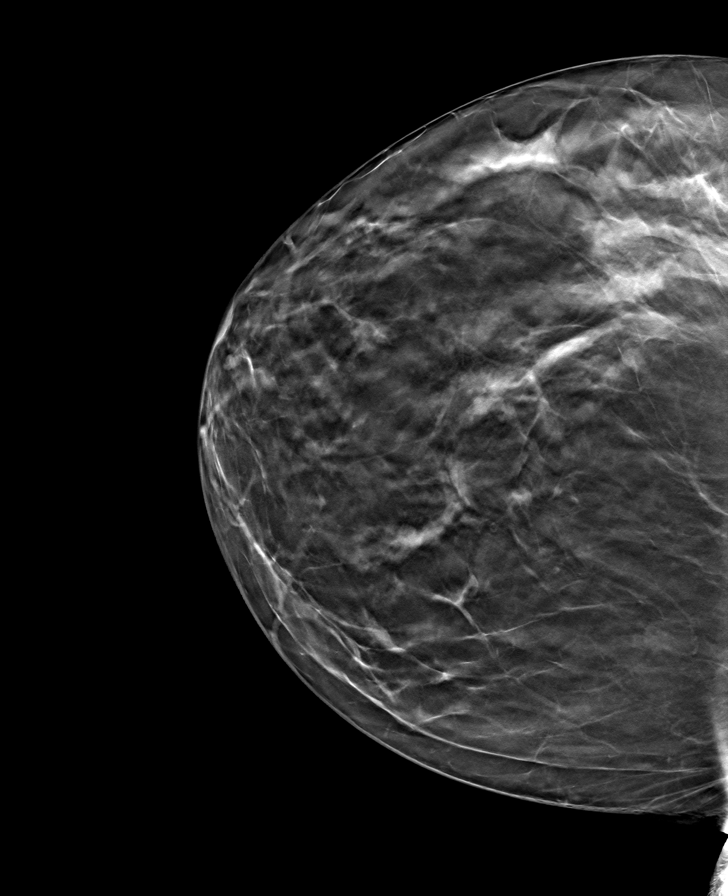

[L MLO tomo · tomo slice 47/93.0]
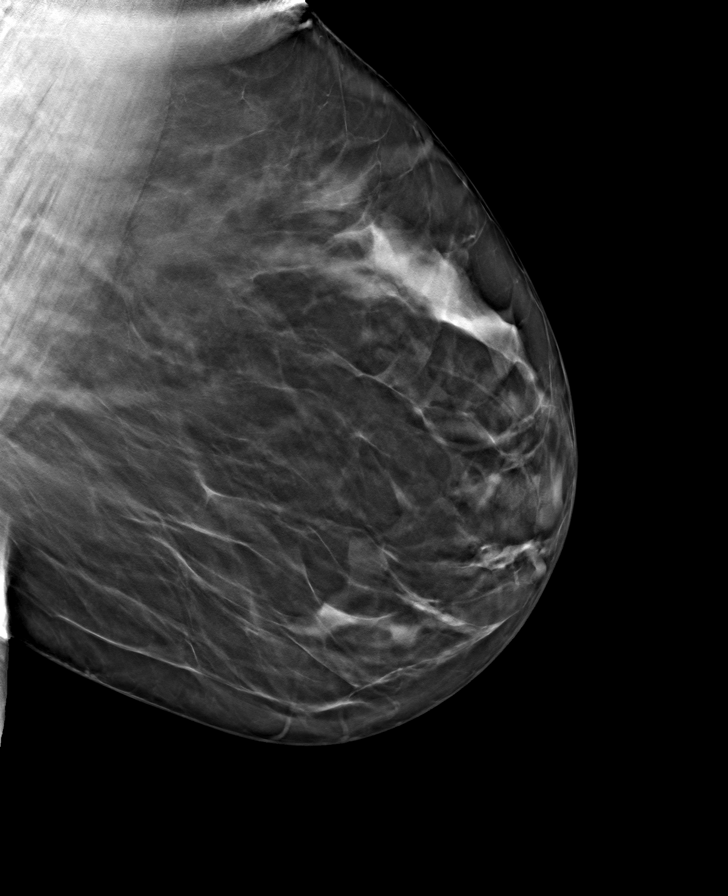

[L CC tomo · tomo slice 39/77.0]
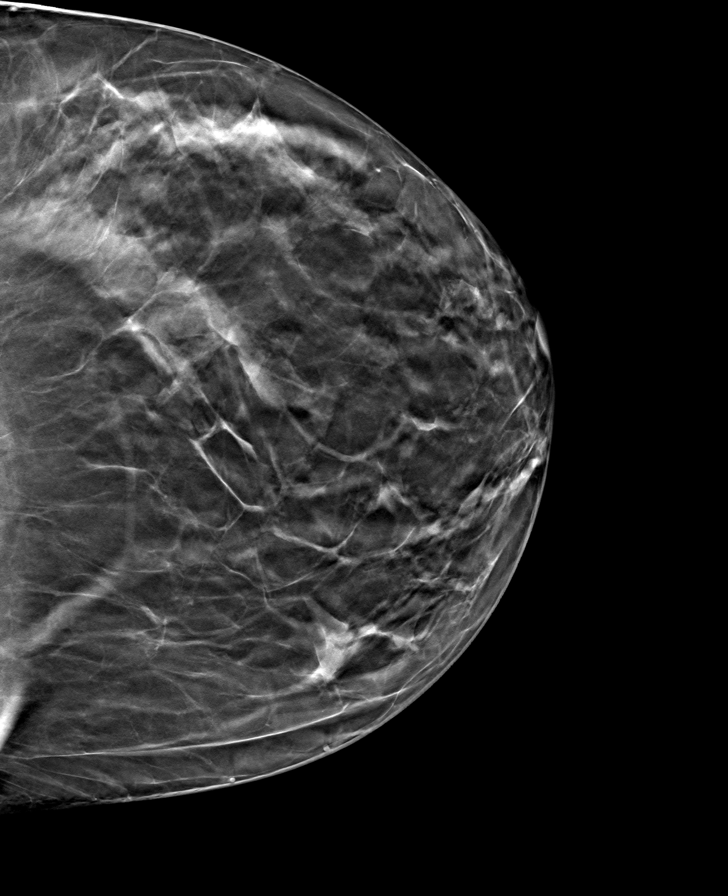

[R MLO tomo · tomo slice 49/97.0]
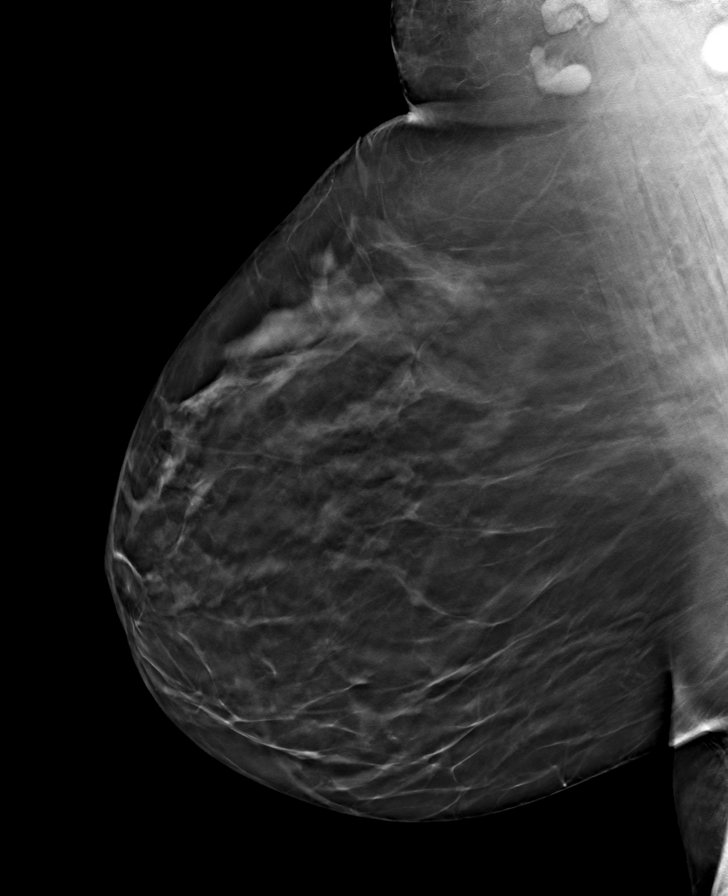

[8 of 24 positions shown; findings below may reference images not displayed]

ACR Breast Density Category b: There are scattered areas of
fibroglandular density.
FINDINGS: There are no findings suspicious for malignancy.
IMPRESSION: No mammographic evidence of malignancy. A result letter of this
screening mammogram will be mailed directly to the patient.

RECOMMENDATION:
Screening mammogram in one year. (Code:51-O-LD2)

BI-RADS CATEGORY  1: Negative.

## 2023-02-18 NOTE — Assessment & Plan Note (Signed)
Continues opth care    Finishing cellcept for optic nerve problem

## 2023-02-18 NOTE — Progress Notes (Signed)
Subjective:    Patient ID: Jennifer Rose, female    DOB: 20-Sep-1956, 67 y.o.   MRN: [id number]  HPI Pt presents to discuss some memory issues   Wt Readings from Last 3 Encounters:  02/18/23 183 lb 2 oz (83.1 kg)  12/03/22 181 lb 6 oz (82.3 kg)  09/17/22 187 lb (84.8 kg)   31.43 kg/m  Vitals:   02/18/23 0800 02/18/23 0830  BP: (!) 144/82 136/77  Pulse: 65   Temp: 98 F (36.7 C)   SpO2: 100%     She is undergoing w/u/tx of optic nerve problem  Taking generic cellcept -? Side eff Hopes to come off of it (did go down to 2 per day)  Likely comes off in May   Family notes  Change in her recent /short term memory  Sometimes goes to do a task and then makes a wrong step  Not finishing tasks   Misplaces things (cell phone) Almost ran out of gas   Her handwriting is more chaotic /not organized    Balance is off at times   Confusion -none   Mood - has been pretty good    Sleep- goes to bed at 11 and wakes up at 8 and feels rested     \HTN bp is stable today  No cp or palpitations or headaches or edema  No side effects to medicines  BP Readings from Last 3 Encounters:  02/18/23 136/77  12/03/22 (!) 143/80  09/17/22 128/66     Losartan 50 mg daily     Lab Results  Component Value Date   HGBA1C 6.3 06/06/2022   Last metabolic panel Lab Results  Component Value Date   GLUCOSE 112 (H) 07/17/2022   NA 139 07/17/2022   K 3.4 (L) 07/17/2022   CL 107 07/17/2022   CO2 22 07/17/2022   BUN 19 07/17/2022   CREATININE 1.23 (H) 07/17/2022   GFRNONAA 52 (L) 09/24/2019   CALCIUM 9.3 07/17/2022   PROT 7.2 06/06/2022   ALBUMIN 3.8 06/06/2022   BILITOT 0.4 06/06/2022   ALKPHOS 77 06/06/2022   AST 20 06/06/2022   ALT 16 06/06/2022   ANIONGAP 8 09/24/2019   GFR 46.5  Lab Results  Component Value Date   TSH 1.99 11/07/2021   Lab Results  Component Value Date   WBC 3.8 (L) 06/06/2022   HGB 12.3 06/06/2022   HCT 37.3 06/06/2022   MCV 79.9  06/06/2022   PLT 106.0 (L) 06/06/2022   Takes omeprazole Lab Results  Component Value Date   VITAMINB12 510 09/24/2019   Last vitamin D  Last CT head was 11/2021  MPRESSION: CT head:   1. No evidence of acute intracranial abnormality. 2. Mild-to-moderate, but nonspecific, chronic cerebral white matter disease. 3. 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 4. Mild generalized cerebral and cerebellar atrophy.  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) 02/18/2023   Short-term memory loss 02/18/2023   Cognitive change 02/18/2023   Peptic duodenitis 09/17/2022   Barrett's esophagus 09/17/2022   Decreased GFR 04/19/2022   Hiccups 01/30/2022   Colon cancer screening 11/12/2019   H/O migraine 08/26/2018   Routine general medical examination at a health care facility 08/26/2018   Hypokalemia 08/26/2018   Thrombocytopenia (HCC) 04/30/2017   Prediabetes 12/13/2014   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  mass index (BMI) of 36.0 to 36.9 in adult 12/13/2014   Acute sinusitis 03/23/2013   GLAUCOMA 04/15/2009   LUPUS 11/09/2008   Chronic cough 09/24/2007   Allergic rhinitis 09/24/2007   Essential hypertension 03/22/2004   Past Medical History:  Diagnosis Date   Allergy    allergic rhinitis   Eye pressure    GERD (gastroesophageal reflux disease)    Hypertension 03/22/2004   Lupus (HCC)    Uterine fibroid 08/22/2000   per GYN   Past Surgical History:  Procedure Laterality Date   BREAST CYST EXCISION Right 36 yrs ago   x2   CESAREAN SECTION     x 3   CHOLECYSTECTOMY     ENDOMETRIAL ABLATION  01/2002   polyps?DUB   Social History   Tobacco Use   Smoking status: Never   Smokeless tobacco: Never  Vaping Use   Vaping Use: Never used  Substance Use Topics   Alcohol use:  No    Alcohol/week: 0.0 standard drinks of alcohol   Drug use: No   Family History  Problem Relation Age of Onset   Blindness Mother    Stroke Mother    Diabetes Mother    Glaucoma Mother    Hypertension Mother    Glaucoma Father    Diabetes Father    Hypertension Father    Prostate cancer Father    Stroke Sister    Diabetes Sister    Glaucoma Maternal Grandmother    Diabetes Maternal Grandmother    Hypertension Maternal Grandmother    Colon cancer Maternal Grandmother    Heart attack Maternal Grandfather    Glaucoma Paternal Grandmother    Heart disease Paternal Grandmother    Colitis Daughter    Hypertension Daughter    Diabetes Daughter    Allergies  Allergen Reactions   Shrimp Extract Shortness Of Breath    Throat swelling,   Guaifenesin Other (See Comments)    hiccups   Mucinex Clear & Cool Day-Night     Hiccups    Current Outpatient Medications on File Prior to Visit  Medication Sig Dispense Refill   amLODipine (NORVASC) 5 MG tablet TAKE 1 TABLET BY MOUTH ONCE A DAY 90 tablet 1   cholecalciferol (VITAMIN D) 1000 UNITS tablet Take 2,000 Units by mouth daily.     fexofenadine (ALLEGRA) 180 MG tablet Take 180 mg by mouth daily as needed for allergies or rhinitis.     fluticasone (FLONASE) 50 MCG/ACT nasal spray USE 1 TO 2 SPRAYS IN EACH NOSTRIL DAILY 16 g 5   hydroxychloroquine (PLAQUENIL) 200 MG tablet Take 200 mg by mouth 2 (two) times daily.     latanoprost (XALATAN) 0.005 % ophthalmic solution Place 1 drop into both eyes as directed.     losartan (COZAAR) 50 MG tablet Take 1 tablet (50 mg total) by mouth daily. 90 tablet 1   meclizine (ANTIVERT) 12.5 MG tablet Take 12.5 mg by mouth 3 (three) times daily as needed.     mycophenolate (CELLCEPT) 500 MG tablet Take 1 tablet (500 mg total) by mouth 2 (two) times daily.     omeprazole (PRILOSEC) 20 MG capsule Take 1 capsule (20 mg total) by mouth 2 (two) times daily before a meal. 30 capsule 3   potassium chloride  (KLOR-CON) 10 MEQ tablet Take 2 tablets (20 mEq total) by mouth daily. 180 tablet 3   No current facility-administered medications on file prior to visit.    Review of Systems  Constitutional:  Positive  for fatigue. Negative for activity change, appetite change, fever and unexpected weight change.  HENT:  Negative for congestion, ear pain, rhinorrhea, sinus pressure and sore throat.   Eyes:  Positive for visual disturbance. Negative for photophobia, pain and redness.  Respiratory:  Negative for cough, shortness of breath and wheezing.   Cardiovascular:  Negative for chest pain and palpitations.  Gastrointestinal:  Negative for abdominal pain, blood in stool, constipation and diarrhea.  Endocrine: Negative for polydipsia and polyuria.  Genitourinary:  Negative for dysuria, frequency and urgency.  Musculoskeletal:  Positive for arthralgias. Negative for back pain and myalgias.  Skin:  Negative for pallor and rash.  Allergic/Immunologic: Negative for environmental allergies.  Neurological:  Negative for dizziness, syncope and headaches.  Hematological:  Negative for adenopathy. Does not bruise/bleed easily.  Psychiatric/Behavioral:  Negative for decreased concentration and dysphoric mood. The patient is not nervous/anxious.        Objective:   Physical Exam Constitutional:      General: She is not in acute distress.    Appearance: Normal appearance. She is well-developed. She is obese. She is not ill-appearing or diaphoretic.  HENT:     Head: Normocephalic and atraumatic.     Right Ear: External ear normal.     Left Ear: External ear normal.     Nose: Nose normal.     Mouth/Throat:     Pharynx: No oropharyngeal exudate.  Eyes:     General: No scleral icterus.       Right eye: No discharge.        Left eye: No discharge.     Conjunctiva/sclera: Conjunctivae normal.     Pupils: Pupils are equal, round, and reactive to light.     Comments: No nystagmus  Neck:     Thyroid: No  thyromegaly.     Vascular: No carotid bruit or JVD.     Trachea: No tracheal deviation.  Cardiovascular:     Rate and Rhythm: Normal rate and regular rhythm.     Heart sounds: Normal heart sounds. No murmur heard. Pulmonary:     Effort: Pulmonary effort is normal. No respiratory distress.     Breath sounds: Normal breath sounds. No wheezing or rales.  Abdominal:     General: Bowel sounds are normal. There is no distension.     Palpations: Abdomen is soft. There is no mass.     Tenderness: There is no abdominal tenderness.  Musculoskeletal:        General: No tenderness.     Cervical back: Full passive range of motion without pain, normal range of motion and neck supple.  Lymphadenopathy:     Cervical: No cervical adenopathy.  Skin:    General: Skin is warm and dry.     Coloration: Skin is not pale.     Findings: No rash.  Neurological:     Mental Status: She is alert and oriented to person, place, and time.     Cranial Nerves: No cranial nerve deficit.     Sensory: No sensory deficit.     Motor: No weakness, tremor, atrophy or abnormal muscle tone.     Coordination: Coordination normal.     Gait: Gait normal.     Deep Tendon Reflexes: Reflexes are normal and symmetric. Reflexes normal.     Comments: No focal cerebellar signs   Psychiatric:        Attention and Perception: Attention normal.        Mood and Affect: Mood normal.  Speech: Speech normal.        Behavior: Behavior normal.        Thought Content: Thought content normal.        Cognition and Memory: Cognition and memory normal.     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)    Continues opth care    Finishing cellcept for optic nerve problem        Hematopoietic and Hemostatic   Thrombocytopenia (HCC)    Cbc today No new bleeding or bruising        Relevant Orders   CBC with Differential/Platelet (Completed)     Other   Cognitive change    Incl short term memory worsening and concentration  See a/p for short term memory loss  Reasusring MMSE exam      Relevant Orders   VITAMIN D 25 Hydroxy (Vit-D Deficiency, Fractures) (Completed)   Vitamin B12 (Completed)   Prediabetes    A1c ordered disc imp of low glycemic diet and wt loss to prevent DM2       Relevant Orders   Hemoglobin A1c (Completed)   Short-term memory loss - Primary    Discussed multi factorial nature of this for most  Age, chronic health problems, fatigue, mood change and med side eff can play a role Rev meds-some ? Of cell cept causing this / she is to finish course soon  Mood is good/ self reported  Some short term memory struggles and slowed concentration  MMSE score today is 29/30 - reassuring  Reviewed last CT head noting some small chronic infarcts and atrophy  Labs pending Will follow closely  Would consider imaging /neuro eval if worsens         Relevant Orders   VITAMIN D 25 Hydroxy (Vit-D Deficiency, Fractures) (Completed)   Vitamin B12 (Completed)

## 2023-02-18 NOTE — Assessment & Plan Note (Addendum)
Discussed multi factorial nature of this for most  Age, chronic health problems, fatigue, mood change and med side eff can play a role Rev meds-some ? Of cell cept causing this / she is to finish course soon  Mood is good/ self reported  Some short term memory struggles and slowed concentration  MMSE score today is 29/30 - reassuring  Reviewed last CT head noting some small chronic infarcts and atrophy  Labs pending Will follow closely  Would consider imaging /neuro eval if worsens

## 2023-02-18 NOTE — Assessment & Plan Note (Signed)
Cbc today  No new bleeding or bruising 

## 2023-02-18 NOTE — Assessment & Plan Note (Signed)
A1c ordered disc imp of low glycemic diet and wt loss to prevent DM2  

## 2023-02-18 NOTE — Assessment & Plan Note (Signed)
bp in fair control at this time  BP Readings from Last 1 Encounters:  02/18/23 136/77   Losartan 50 mg daily  stable

## 2023-02-18 NOTE — Assessment & Plan Note (Signed)
Incl short term memory worsening and concentration  See a/p for short term memory loss  Reasusring MMSE exam

## 2023-02-18 NOTE — Patient Instructions (Signed)
Your mini mental status exam is normal today   Labs today   Let us know how you are off the generic cellcept  Then we will make a plan   Read more  Socialize Stay physically and mentally active

## 2023-02-19 ENCOUNTER — Encounter: Payer: Self-pay | Admitting: *Deleted

## 2023-02-19 ENCOUNTER — Telehealth: Payer: Self-pay | Admitting: *Deleted

## 2023-02-19 NOTE — Telephone Encounter (Signed)
Opened in error

## 2023-02-26 NOTE — Telephone Encounter (Signed)
error 

## 2023-03-22 ENCOUNTER — Other Ambulatory Visit: Payer: Self-pay | Admitting: Family Medicine

## 2023-03-29 ENCOUNTER — Ambulatory Visit: Payer: Medicare PPO | Admitting: Family Medicine

## 2023-03-29 ENCOUNTER — Encounter: Payer: Self-pay | Admitting: Family Medicine

## 2023-03-29 VITALS — BP 128/72 | HR 67 | Temp 97.8°F | Ht 64.0 in | Wt 189.4 lb

## 2023-03-29 DIAGNOSIS — R21 Rash and other nonspecific skin eruption: Secondary | ICD-10-CM | POA: Diagnosis not present

## 2023-03-29 DIAGNOSIS — I10 Essential (primary) hypertension: Secondary | ICD-10-CM

## 2023-03-29 NOTE — Progress Notes (Signed)
Subjective:    Patient ID: Jennifer Rose, female    DOB: 1956-05-01, 67 y.o.   MRN: [id number]  HPI Pt presents for rash on neck /face HTN  Wt Readings from Last 3 Encounters:  03/29/23 189 lb 6 oz (85.9 kg)  02/18/23 183 lb 2 oz (83.1 kg)  12/03/22 181 lb 6 oz (82.3 kg)   32.51 kg/m  Vitals:   03/29/23 0921 03/29/23 0943  BP: (!) 184/92 128/72  Pulse: 67   Temp: 97.8 F (36.6 C)   SpO2: 98%     Some kind of rash ? From  Last week- spots on forehead Dry rash on sides of neck and around ears  One area on nose started peeling Is itchy  Not in ears  Not in scalp that she can tell - just dry spots at nape of neck   No tick bites  No fevers    Daughter bought her some facial products  Cetaphil oint Cetaphil healthy radiance  Cetaphil PHA exfoliating cleanser  Facial toner with witch hazel Also some hyaluronic acid serum    HTN BP Readings from Last 3 Encounters:  03/29/23 128/72  02/18/23 136/77  12/03/22 (!) 143/80    Losartan 50 mg daily  Amlodipine 5 mg daily  CMP     Component Value Date/Time   NA 142 02/18/2023 0844   K 4.2 02/18/2023 0844   CL 106 02/18/2023 0844   CO2 26 02/18/2023 0844   GLUCOSE 81 02/18/2023 0844   BUN 21 02/18/2023 0844   CREATININE 1.24 (H) 02/18/2023 0844   CREATININE 1.14 (H) 09/24/2019 0838   CALCIUM 9.6 02/18/2023 0844   PROT 6.9 02/18/2023 0844   ALBUMIN 3.9 02/18/2023 0844   AST 16 02/18/2023 0844   AST 36 09/24/2019 0838   ALT 10 02/18/2023 0844   ALT 37 09/24/2019 0838   ALKPHOS 105 02/18/2023 0844   BILITOT 0.4 02/18/2023 0844   BILITOT 0.4 09/24/2019 0838   GFR 45.86 (L) 02/18/2023 0844   GFRNONAA 52 (L) 09/24/2019 0838   Lab Results  Component Value Date   HGBA1C 5.7 02/18/2023   Patient Active Problem List   Diagnosis Date Noted   Rash of neck 03/29/2023   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) 02/18/2023    Short-term memory loss 02/18/2023   Cognitive change 02/18/2023   Peptic duodenitis 09/17/2022   Barrett's esophagus 09/17/2022   Decreased GFR 04/19/2022   Hiccups 01/30/2022   Colon cancer screening 11/12/2019   H/O migraine 08/26/2018   Routine general medical examination at a health care facility 08/26/2018   Hypokalemia 08/26/2018   Thrombocytopenia (HCC) 04/30/2017   Prediabetes 12/13/2014   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 36.9 in adult 12/13/2014   GLAUCOMA 04/15/2009   LUPUS 11/09/2008   Chronic cough 09/24/2007   Allergic rhinitis 09/24/2007   Essential hypertension 03/22/2004   Past Medical History:  Diagnosis Date   Allergy    allergic rhinitis   Eye pressure    GERD (gastroesophageal reflux disease)    Hypertension 03/22/2004   Lupus (HCC)    Uterine fibroid 08/22/2000   per GYN   Past Surgical History:  Procedure Laterality Date   BREAST CYST EXCISION Right 36 yrs ago   x2   CESAREAN SECTION     x 3   CHOLECYSTECTOMY     ENDOMETRIAL ABLATION  01/2002   polyps?DUB   Social History   Tobacco Use   Smoking status: Never   Smokeless tobacco: Never  Vaping Use   Vaping Use: Never used  Substance Use Topics   Alcohol use: No    Alcohol/week: 0.0 standard drinks of alcohol   Drug use: No   Family History  Problem Relation Age of Onset   Blindness Mother    Stroke Mother    Diabetes Mother    Glaucoma Mother    Hypertension Mother    Glaucoma Father    Diabetes Father    Hypertension Father    Prostate cancer Father    Stroke Sister    Diabetes Sister    Glaucoma Maternal Grandmother    Diabetes Maternal Grandmother    Hypertension Maternal Grandmother    Colon cancer Maternal Grandmother    Heart attack Maternal Grandfather    Glaucoma Paternal Grandmother    Heart disease Paternal Grandmother    Colitis Daughter    Hypertension Daughter    Diabetes Daughter    Allergies   Allergen Reactions   Shrimp Extract Shortness Of Breath    Throat swelling,   Guaifenesin Other (See Comments)    hiccups   Mucinex Clear & Cool Day-Night     Hiccups    Current Outpatient Medications on File Prior to Visit  Medication Sig Dispense Refill   amLODipine (NORVASC) 5 MG tablet TAKE ONE TABLET BY MOUTH ONCE A DAY 90 tablet 1   cholecalciferol (VITAMIN D) 1000 UNITS tablet Take 2,000 Units by mouth daily.     fexofenadine (ALLEGRA) 180 MG tablet Take 180 mg by mouth daily as needed for allergies or rhinitis.     fluticasone (FLONASE) 50 MCG/ACT nasal spray USE 1 TO 2 SPRAYS IN EACH NOSTRIL DAILY 16 g 5   hydroxychloroquine (PLAQUENIL) 200 MG tablet Take 200 mg by mouth 2 (two) times daily.     latanoprost (XALATAN) 0.005 % ophthalmic solution Place 1 drop into both eyes as directed.     losartan (COZAAR) 50 MG tablet Take 1 tablet (50 mg total) by mouth daily. 90 tablet 1   meclizine (ANTIVERT) 12.5 MG tablet Take 12.5 mg by mouth 3 (three) times daily as needed.     mycophenolate (CELLCEPT) 500 MG tablet Take 1 tablet (500 mg total) by mouth 2 (two) times daily.     omeprazole (PRILOSEC) 20 MG capsule Take 1 capsule (20 mg total) by mouth 2 (two) times daily before a meal. 30 capsule 3   potassium chloride (KLOR-CON) 10 MEQ tablet Take 2 tablets (20 mEq total) by mouth daily. 180 tablet 3   No current facility-administered medications on file prior to visit.     Review of Systems  Constitutional:  Negative for fatigue and fever.  Respiratory:  Negative for cough.   Musculoskeletal:  Positive for arthralgias.  Skin:  Positive for rash. Negative for pallor and wound.       Objective:   Physical Exam Constitutional:      Appearance: Normal appearance. She is obese. She is not ill-appearing or diaphoretic.  HENT:     Mouth/Throat:     Mouth: Mucous membranes are moist.     Pharynx: Oropharynx is clear.  Eyes:     General:        Right eye: No discharge.         Left eye: No discharge.     Conjunctiva/sclera: Conjunctivae normal.     Pupils: Pupils are  equal, round, and reactive to light.  Musculoskeletal:     Right lower leg: No edema.     Left lower leg: No edema.  Skin:    Coloration: Skin is not jaundiced.     Findings: No bruising.     Comments: Patchy rash with some scale and plaques hyperpigmentation on back of neck and around ears and upper forehead  No comedones or milia or vesicles  Also hyperpigmentation on tip of nose Mildly dry skin and lips  Some papules on arms (can feel more than see)   Neurological:     Mental Status: She is alert.     Cranial Nerves: No cranial nerve deficit.  Psychiatric:        Mood and Affect: Mood normal.           Assessment & Plan:   Problem List Items Addressed This Visit       Cardiovascular and Mediastinum   Essential hypertension    bp in fair control at this time (much better on 2nd check) BP Readings from Last 1 Encounters:  03/29/23 128/72  Losartan 50 mg daily  Amlodipine 5 mg daily stable         Musculoskeletal and Integument   Rash of neck - Primary    Rash on face and neck since starting new products (see note) with toner/witch hazel, hyaluronic acid and exfoliant  Will stop these -suspect this is cause  Change to  Dove soap for sens skin or neutrogena glycerin bar  Scent free moisturizer  Sunscreen when needed   Can use vaseline or similar on itchy areas  If no improvement consider low potency steroid cream briefly  If still not improved-   lupus rash and NLD are in the differential - would consider dermatology  Update if not starting to improve in 1-2 weeks  or if worsening

## 2023-03-29 NOTE — Patient Instructions (Signed)
Stop your facial products for now   Wash with dove soap for sensitive skin  Moisturize with scent free product like cetaphil or cerave or lubriderm   Continue sunscreen   The cetaphil ointment is good for injuries and itchy areas   If rash does not start to improve in 1-2 weeks let us know    Make sure your lip balm has spf if you are outdoors

## 2023-03-29 NOTE — Assessment & Plan Note (Signed)
Rash on face and neck since starting new products (see note) with toner/witch hazel, hyaluronic acid and exfoliant  Will stop these -suspect this is cause  Change to  Dove soap for sens skin or neutrogena glycerin bar  Scent free moisturizer  Sunscreen when needed   Can use vaseline or similar on itchy areas  If no improvement consider low potency steroid cream briefly  If still not improved-   lupus rash and NLD are in the differential - would consider dermatology  Update if not starting to improve in 1-2 weeks  or if worsening

## 2023-03-29 NOTE — Assessment & Plan Note (Signed)
bp in fair control at this time (much better on 2nd check) BP Readings from Last 1 Encounters:  03/29/23 128/72   Losartan 50 mg daily  Amlodipine 5 mg daily stable

## 2023-04-11 ENCOUNTER — Telehealth: Payer: Self-pay | Admitting: Gastroenterology

## 2023-04-11 ENCOUNTER — Other Ambulatory Visit: Payer: Self-pay

## 2023-04-11 MED ORDER — OMEPRAZOLE 20 MG PO CPDR: 20.0000 mg | DELAYED_RELEASE_CAPSULE | Freq: Two times a day (BID) | ORAL | 3 refills | Status: DC

## 2023-04-11 NOTE — Telephone Encounter (Signed)
Returned patient's call and let her know that Prilosec was refilled and sent to her pharmacy. Patient also wanted to know when her next recall was which is 08/2025.

## 2023-04-11 NOTE — Telephone Encounter (Signed)
Inbound call from patient requesting a refill for Prilosec. Patient is also requesting a call back to discuss questions she has. Please advise.

## 2023-04-16 ENCOUNTER — Encounter: Payer: Self-pay | Admitting: Dermatology

## 2023-04-16 ENCOUNTER — Ambulatory Visit (INDEPENDENT_AMBULATORY_CARE_PROVIDER_SITE_OTHER): Payer: Medicare PPO | Admitting: Dermatology

## 2023-04-16 VITALS — BP 133/88 | HR 64

## 2023-04-16 DIAGNOSIS — L249 Irritant contact dermatitis, unspecified cause: Secondary | ICD-10-CM | POA: Diagnosis not present

## 2023-04-16 DIAGNOSIS — D2330 Other benign neoplasm of skin of unspecified part of face: Secondary | ICD-10-CM

## 2023-04-16 DIAGNOSIS — D239 Other benign neoplasm of skin, unspecified: Secondary | ICD-10-CM

## 2023-04-16 DIAGNOSIS — L309 Dermatitis, unspecified: Secondary | ICD-10-CM

## 2023-04-16 MED ORDER — HYDROCORTISONE 2.5 % EX OINT: TOPICAL_OINTMENT | CUTANEOUS | 0 refills | Status: DC

## 2023-04-16 NOTE — Patient Instructions (Addendum)
Based on our discussion and examination, here are the key instructions for your treatment and care:  - Medication: Apply hydrocortisone 2.5% ointment twice daily for two weeks. - Skin Care:   - Discontinue use of witch hazel, glycolic acid, Cetaphil, and Shea butter.   - Resume using Neutrogena soap, as previously tolerated without issues.   - Use sunscreen regularly to protect your skin from sun sensitivity due to lupus. - Follow-Up Appointment: Please return for a follow-up in two to three weeks to monitor the improvement of your rash and discuss potential treatment for post-inflammatory hyperpigmentation.   - Additional Care:   - Apply hydrocortisone cream on dry spots on your arm and the lupus patch identified during your visit.   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  Your prescription for hydrocortisone has been sent to your pharmacy. Please ensure to follow the outlined care plan and contact my office if you have any questions or concerns prior to your next appointment.      Due to recent changes in healthcare laws, you may see results of your pathology and/or laboratory studies on MyChart before the doctors have had a chance to review them. We understand that in some cases there may be results that are confusing or concerning to you. Please understand that not all results are received at the same time and often the doctors may need to interpret multiple results in order to provide you with the best plan of care or course of treatment. Therefore, we ask that you please give [place] 2 business days to thoroughly review all your results before contacting the office for clarification. Should we see a critical lab result, you will be contacted sooner.   If You Need Anything After Your Visit  If you have any questions or concerns for your doctor, please call our main line at [phone] If no one answers, please  leave a voicemail as directed and we will return your call as soon as possible. Messages left after 4 pm will be answered the following business day.   You may also send [place] a message via MyChart. We typically respond to MyChart messages within 1-2 business days.  For prescription refills, please ask your pharmacy to contact our office. Our fax number is [phone].  If you have an urgent issue when the clinic is closed that cannot wait until the next business day, you can page your doctor at the number below.    Please note that while we do our best to be available for urgent issues outside of office hours, we are not available 24/7.   If you have an urgent issue and are unable to reach [place], you may choose to seek medical care at your doctor's office, retail clinic, urgent care center, or emergency room.  If you have a medical emergency, please immediately call 911 or go to the emergency department. In the event of inclement weather, please call our main line at [phone] for an update on the status of any delays or closures.  Dermatology Medication Tips: Please keep the boxes that topical medications come in in order to help keep track of the instructions about where and how to use these. Pharmacies typically print the medication instructions only on the boxes and not directly on the medication tubes.   If your medication is too expensive, please contact our office at [phone] or send [place] a message through MyChart.  We are unable to tell what your co-pay for medications will be in advance as this is different depending on your insurance coverage. However, we may be able to find a substitute medication at lower cost or fill out paperwork to get insurance to cover a needed medication.   If a prior authorization is required to get your medication covered by your insurance company, please allow [place] 1-2 business days to complete this process.  Drug prices often vary depending on where the  prescription is filled and some pharmacies may offer cheaper prices.  The website www.goodrx.com contains coupons for medications through different pharmacies. The prices here do not account for what the cost may be with help from insurance (it may be cheaper with your insurance), but the website can give you the price if you did not use any insurance.  - You can print the associated coupon and take it with your prescription to the pharmacy.  - You may also stop by our office during regular business hours and pick up a GoodRx coupon card.  - If you need your prescription sent electronically to a different pharmacy, notify our office through [facility] or by phone at [phone]

## 2023-04-16 NOTE — Progress Notes (Signed)
   New Patient Visit   Subjective  Jennifer Rose is a 68 y.o. female who presents for the following: rash which came up 03/30/23. Itches now and then at a scale of 4. Pt started witch hazel, glycolic acid, cetaphil cream,shea butter cream on 03/23/23 and Neutragena soap per her daughter's recommendation. Pt stopped everything except Neutragena soap as she had been using it for a long time with no issues.  After stopping everything, rash has improved slightly.   The following portions of the chart were reviewed this encounter and updated as appropriate: medications, allergies, medical history  Review of Systems:  No other skin or systemic complaints except as noted in HPI or Assessment and Plan.  Objective           Well appearing patient in no apparent distress; mood and affect are within normal limits.   A focused examination was performed of the following areas:   Relevant exam findings are noted in the Assessment and Plan.    Assessment & Plan   Visit Summary:  Jennifer Rose, a new patient with a history of lupus, presented with a rash after using new skincare products. The rash, diagnosed as irritant contact dermatitis, has improved since discontinuing the products. She also has a history of lupus rash on her arms and legs, currently managed with Plaquenil and topical creams. Physical examination revealed dry spots on her arm, a dermatofibroma on her face, and a lupus patch. Treatment includes hydrocortisone ointment for the dermatitis and lupus patches, and a follow-up in two to three weeks.  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   DERMATOFIBROMA Exam: Firm/brown papulenodule with dimple sign.  Treatment Plan: A dermatofibroma is a benign growth possibly related to trauma, such as an  insect bite, cut from shaving, or inflamed acne-type bump.  Treatment options to remove include shave or excision with resulting scar and risk of recurrence.  Since benign-appearing and not bothersome, will observe for now.    Return in about 2 weeks (around 04/30/2023) for irritant dermatitis.  Owens Shark, CMA, am acting as scribe for [employer], DO.   Documentation: I have reviewed the above documentation for accuracy and completeness, and I agree with the above.  Jennifer Reusing, DO

## 2023-04-25 ENCOUNTER — Encounter (HOSPITAL_COMMUNITY): Payer: Self-pay

## 2023-04-25 ENCOUNTER — Other Ambulatory Visit: Payer: Self-pay

## 2023-04-25 ENCOUNTER — Emergency Department (HOSPITAL_COMMUNITY)
Admission: EM | Admit: 2023-04-25 | Discharge: 2023-04-25 | Disposition: A | Discharge status: Home / Self Care | Payer: Medicare PPO | Attending: Emergency Medicine | Admitting: Emergency Medicine

## 2023-04-25 DIAGNOSIS — I10 Essential (primary) hypertension: Secondary | ICD-10-CM | POA: Diagnosis not present

## 2023-04-25 DIAGNOSIS — Z79899 Other long term (current) drug therapy: Secondary | ICD-10-CM | POA: Diagnosis not present

## 2023-04-25 DIAGNOSIS — R040 Epistaxis: Secondary | ICD-10-CM | POA: Insufficient documentation

## 2023-04-25 NOTE — ED Provider Notes (Signed)
[place] EMERGENCY DEPARTMENT AT [facility] Provider Note   CSN: [id number] Arrival date & time: 04/25/23  1154     History  Chief Complaint  Patient presents with   Epistaxis    Jennifer Rose is a 67 y.o. female.  With a history of hypertension, lupus, GERD, seasonal allergies who presents to the ED for evaluation of epistaxis.  She states she began choking on a piece of watermelon yesterday afternoon which caused her to blow her nose very hard.  After this she noticed a nosebleed out of the right nostril.  This lasted approximately 10 minutes and resolved with direct pressure.  She states she may have picked her nose today and the nosebleed then returned.  This lasted 20 minutes and resolved on the way to the emergency room.  Again resolved with direct pressure.  She denies frequent history of epistaxis.  No blood thinner use.  She denies blood flow down the esophagus.  No dizziness, lightheadedness or fatigue.  No other specific trauma to the nose.   Epistaxis      Home Medications Prior to Admission medications   Medication Sig Start Date End Date Taking? Authorizing Provider  amLODipine (NORVASC) 5 MG tablet TAKE ONE TABLET BY MOUTH ONCE A DAY 03/22/23   Tower, Audrie Gallus, MD  cholecalciferol (VITAMIN D) 1000 UNITS tablet Take 2,000 Units by mouth daily.    [provider]  fexofenadine (ALLEGRA) 180 MG tablet Take 180 mg by mouth daily as needed for allergies or rhinitis.    [provider]  fluticasone (FLONASE) 50 MCG/ACT nasal spray USE 1 TO 2 SPRAYS IN EACH NOSTRIL DAILY 05/29/22   Tower, [place] A, MD  hydrocortisone 2.5 % ointment Apply to face bid for 2 weeks 04/16/23   Terri Piedra, DO  hydroxychloroquine (PLAQUENIL) 200 MG tablet Take 200 mg by mouth 2 (two) times daily.    [provider]  latanoprost (XALATAN) 0.005 % ophthalmic solution Place 1 drop into both eyes as directed.    [provider]  losartan (COZAAR) 50  MG tablet Take 1 tablet (50 mg total) by mouth daily. 12/14/22   Tower, Audrie Gallus, MD  meclizine (ANTIVERT) 12.5 MG tablet Take 12.5 mg by mouth 3 (three) times daily as needed.    [provider]  mycophenolate (CELLCEPT) 500 MG tablet Take 1 tablet (500 mg total) by mouth 2 (two) times daily. 05/10/22   Joaquim Nam, MD  omeprazole (PRILOSEC) 20 MG capsule Take 1 capsule (20 mg total) by mouth 2 (two) times daily before a meal. 04/11/23   Jenel Lucks, MD  potassium chloride (KLOR-CON) 10 MEQ tablet Take 2 tablets (20 mEq total) by mouth daily. 11/15/20   Tower, Audrie Gallus, MD      Allergies    Shrimp extract, Guaifenesin, and Mucinex clear & cool day-night    Review of Systems   Review of Systems  HENT:  Positive for nosebleeds.   All other systems reviewed and are negative.   Physical Exam Updated Vital Signs BP (!) 155/112   Pulse 74   Temp 98.1 F (36.7 C) (Oral)   Resp 18   Ht 5\' 5"  (1.651 m)   Wt 82.1 kg   SpO2 100%   BMI 30.12 kg/m  Physical Exam Vitals and nursing note reviewed.  Constitutional:      General: She is not in acute distress.    Appearance: Normal appearance. She is normal weight. She is  not ill-appearing.  HENT:     Head: Normocephalic and atraumatic.     Nose:     Comments: Small scab to the right Kiesselbach plexus. No active bleeding    Mouth/Throat:     Comments: No posterior pharyngeal erythema or bleeding Pulmonary:     Effort: Pulmonary effort is normal. No respiratory distress.  Abdominal:     General: Abdomen is flat.  Musculoskeletal:        General: Normal range of motion.     Cervical back: Neck supple.  Skin:    General: Skin is warm and dry.  Neurological:     Mental Status: She is alert and oriented to person, place, and time.  Psychiatric:        Mood and Affect: Mood normal.        Behavior: Behavior normal.     ED Results / Procedures / Treatments   Labs (all labs ordered are listed, but only abnormal  results are displayed) Labs Reviewed - No data to display  EKG None  Radiology No results found.  Procedures Procedures    Medications Ordered in ED Medications - No data to display  ED Course/ Medical Decision Making/ A&P                             Medical Decision Making This patient presents to the ED for concern of epistaxis, this involves an extensive number of treatment options, and is a complaint that carries with it a high risk of complications and morbidity. Differential diagnosis includes anterior epistaxis, posterior epistaxis, nasal trauma  Additional history obtained from: Nursing notes from this visit.  Afebrile, hypertensive but otherwise hemodynamically stable.  67 year old female presenting to the ED for evaluation of uncomplicated epistaxis.  She appears well on physical exam.  She has a small scab to the right nasal septum, this is most likely the source of her bleeding.  No evidence of posterior epistaxis.  No signs or symptoms of anemia.  She was given information regarding epistaxis treatment at home including treatment with Afrin.  She was encouraged to return with new or worsening symptoms.  Stable at discharge.  At this time there does not appear to be any evidence of an acute emergency medical condition and the patient appears stable for discharge with appropriate outpatient follow up. Diagnosis was discussed with patient who verbalizes understanding of care plan and is agreeable to discharge. I have discussed return precautions with patient who verbalizes understanding. Patient encouraged to follow-up with their PCP within 1 week. All questions answered.  Note: Portions of this report may have been transcribed using voice recognition software. Every effort was made to ensure accuracy; however, inadvertent computerized transcription errors may still be present.        Final Clinical Impression(s) / ED Diagnoses Final diagnoses:  Right-sided epistaxis     Rx / DC Orders ED Discharge Orders     None         Mora Bellman 04/25/23 1228    Rondel Baton, MD 04/28/23 Ernestina [place]

## 2023-04-25 NOTE — Discharge Instructions (Signed)
You have been seen today for your complaint of nosebleed. Your discharge medications include oxymetazolin. This is sold under the brand name Afrin. Apply one spray to a tissue and place directly into the bleeding nostril and hold pressure for 3 minutes. Follow up with: your PCP in one week Please seek immediate medical care if you develop any of the following symptoms: You have a nosebleed after a fall or a head injury. Your nosebleed does not go away after 20 minutes. You feel dizzy or weak. You have unusual bleeding from other parts of your body. You have unusual bruising on other parts of your body. You become sweaty. You vomit blood. At this time there does not appear to be the presence of an emergent medical condition, however there is always the potential for conditions to change. Please read and follow the below instructions.  Do not take your medicine if  develop an itchy rash, swelling in your mouth or lips, or difficulty breathing; call 911 and seek immediate emergency medical attention if this occurs.  You may review your lab tests and imaging results in their entirety on your MyChart account.  Please discuss all results of fully with your primary care provider and other specialist at your follow-up visit.  Note: Portions of this text may have been transcribed using voice recognition software. Every effort was made to ensure accuracy; however, inadvertent computerized transcription errors may still be present.

## 2023-04-25 NOTE — ED Triage Notes (Addendum)
Patient has had a right sided nose bleed since last night. Not on a blood thinner. Said the bleed was "steady for a few minutes." No bleeding on arrival. Does not usually get nose bleeds. Had a coughing fit last night and blew her nose hard, then got the nose bleed.

## 2023-04-26 ENCOUNTER — Telehealth: Payer: Self-pay

## 2023-04-26 NOTE — Transitions of Care (Post Inpatient/ED Visit) (Signed)
Unable to reach pt by phone and left v/m requesting pt to cb [phone].      04/26/2023  Name: Jennifer Rose MRN: [id number] DOB: 08/16/1956  Today's TOC FU Call Status: Today's TOC FU Call Status:: Unsuccessul Call (1st Attempt) Unsuccessful Call (1st Attempt) Date: 04/26/23  Attempted to reach the patient regarding the most recent Inpatient/ED visit.  Follow Up Plan: Additional outreach attempts will be made to reach the patient to complete the Transitions of Care (Post Inpatient/ED visit) call.   Signature Lewanda Rife, LPN

## 2023-04-29 ENCOUNTER — Other Ambulatory Visit: Payer: Self-pay | Admitting: Dermatology

## 2023-04-29 NOTE — Telephone Encounter (Signed)
Aware , will see her then  Agree with the ER precautions

## 2023-04-29 NOTE — Transitions of Care (Post Inpatient/ED Visit) (Signed)
Pt was seen Jennifer Rose ED on 04/25/23. Pt had nose bleed that finally stopped on its own; pt using afrin as instructed by ED provider. pt has not had any more nosebleeds and no H/A, pt will not be able to FU with Dr Milinda Antis until 05/13/23 at 9 AM. UC & ED precautions given and pt voiced understanding. Sending note to Dr Milinda Antis.       04/29/2023  Name: Jennifer Rose MRN: [id number] DOB: 1956-07-20  Today's TOC FU Call Status: Today's TOC FU Call Status:: Successful TOC FU Call Competed Unsuccessful Call (1st Attempt) Date: 04/26/23 [facility] FU Call Complete Date: 04/29/23  Transition Care Management Follow-up Telephone Call Date of Discharge: 04/25/23 Discharge Facility: Jennifer Rose [facility]) Type of Discharge: Emergency Department Reason for ED Visit: Other: (nose bleed that finally stopped on its own; pt using afrin as instructed by ED provider. pt has not had any more nosebleeds and no H/A, pt will not be able to FU with Dr Milinda Antis until 05/13/23 at 9 AM. UC & ED precautions given and pt voiced understanding.) How have you been since you were released from the hospital?: Better Any questions or concerns?: No  Items Reviewed: Did you receive and understand the discharge instructions provided?: Yes Medications obtained,verified, and reconciled?: Yes (Medications Reviewed) (pt also using OTC afrin as instructed by ED provider.) Any new allergies since your discharge?: No Dietary orders reviewed?: NA Do you have support at home?: Yes People in Home: sibling(s) Name of Support/Comfort Primary Source: Aram Beecham  Medications Reviewed Today: Medications Reviewed Today     Reviewed by Candis Schatz, CMA (Certified Medical Assistant) on 04/16/23 at 1623  Med List Status: <None>   Medication Order Taking? Sig Documenting Provider Last Dose Status Informant  amLODipine (NORVASC) 5 MG tablet [id number] No TAKE ONE TABLET BY MOUTH ONCE A DAY Tower, Audrie Gallus, MD Taking Active   cholecalciferol (VITAMIN D)  1000 UNITS tablet [id number] No Take 2,000 Units by mouth daily. [provider] Taking Active   fexofenadine (ALLEGRA) 180 MG tablet [id number] No Take 180 mg by mouth daily as needed for allergies or rhinitis. [provider] Taking Active   fluticasone (FLONASE) 50 MCG/ACT nasal spray [id number] No USE 1 TO 2 SPRAYS IN EACH NOSTRIL DAILY Tower, Audrie Gallus, MD Taking Active   hydroxychloroquine (PLAQUENIL) 200 MG tablet [id number] No Take 200 mg by mouth 2 (two) times daily. [provider] Taking Active   latanoprost (XALATAN) 0.005 % ophthalmic solution [id number] No Place 1 drop into both eyes as directed. [provider] Taking Active   losartan (COZAAR) 50 MG tablet [id number] No Take 1 tablet (50 mg total) by mouth daily. Tower, Audrie Gallus, MD Taking Active   meclizine (ANTIVERT) 12.5 MG tablet [id number] No Take 12.5 mg by mouth 3 (three) times daily as needed. [provider] Taking Active   mycophenolate (CELLCEPT) 500 MG tablet [id number] No Take 1 tablet (500 mg total) by mouth 2 (two) times daily. Joaquim Nam, MD Taking Active   omeprazole (PRILOSEC) 20 MG capsule [id number]  Take 1 capsule (20 mg total) by mouth 2 (two) times daily before a meal. Jenel Lucks, MD  Active   potassium chloride (KLOR-CON) 10 MEQ tablet [id number] No Take 2 tablets (20 mEq total) by mouth daily. Tower, Audrie Gallus, MD Taking Active             Home Care and Equipment/Supplies: Were Home Health Services Ordered?: NA Any new equipment or medical  supplies ordered?: NA  Functional Questionnaire: Do you need assistance with bathing/showering or dressing?: No Do you need assistance with meal preparation?: No Do you need assistance with eating?: No Do you have difficulty maintaining continence: No Do you need assistance with getting out of bed/getting out of a chair/moving?: No Do you have difficulty managing or taking your medications?: No  Follow up appointments  reviewed: PCP Follow-up appointment confirmed?: Yes Date of PCP follow-up appointment?: 05/13/23 Follow-up Provider: Dr Idamae Schuller [facility] Follow-up appointment confirmed?: NA Do you need transportation to your follow-up appointment?: No Do you understand care options if your condition(s) worsen?: Yes-patient verbalized understanding    SIGNATURE .mec

## 2023-04-30 ENCOUNTER — Other Ambulatory Visit: Payer: Self-pay

## 2023-04-30 DIAGNOSIS — L309 Dermatitis, unspecified: Secondary | ICD-10-CM

## 2023-04-30 MED ORDER — HYDROCORTISONE 2.5 % EX CREA
TOPICAL_CREAM | Freq: Two times a day (BID) | CUTANEOUS | 3 refills | Status: AC | PRN
Start: 2023-04-30 — End: ?

## 2023-05-08 ENCOUNTER — Encounter: Payer: Self-pay | Admitting: Dermatology

## 2023-05-08 ENCOUNTER — Ambulatory Visit (INDEPENDENT_AMBULATORY_CARE_PROVIDER_SITE_OTHER): Payer: Medicare PPO | Admitting: Dermatology

## 2023-05-08 VITALS — BP 131/82

## 2023-05-08 DIAGNOSIS — L309 Dermatitis, unspecified: Secondary | ICD-10-CM

## 2023-05-08 DIAGNOSIS — L2489 Irritant contact dermatitis due to other agents: Secondary | ICD-10-CM | POA: Diagnosis not present

## 2023-05-08 MED ORDER — TACROLIMUS 0.1 % EX OINT: TOPICAL_OINTMENT | Freq: Two times a day (BID) | CUTANEOUS | 2 refills | Status: AC

## 2023-05-08 MED ORDER — PREDNISONE 10 MG PO TABS: ORAL_TABLET | ORAL | 0 refills | Status: AC

## 2023-05-08 MED ORDER — CLOBETASOL PROPIONATE 0.05 % EX OINT: 1.0000 | TOPICAL_OINTMENT | Freq: Two times a day (BID) | CUTANEOUS | 2 refills | Status: DC

## 2023-05-08 NOTE — Progress Notes (Signed)
   Follow Up Visit   Subjective  Jennifer Rose is a 67 y.o. female who presents for follow up on Irritant Contact Dermatitis. (Lupus Triggered secondary to new products) Treatment includes: Hydrocortisone 2.5% ointment to face bid for 2 weeks. Can also apply to lupus rash on back and arms. She has been using it continuously twice daily since her last visit on 04/16/2023. It is barley itchy now. There is a spot on the right neck that still itches. Eyelids were not itchy before.  The following portions of the chart were reviewed this encounter and updated as appropriate: medications, allergies, medical history  Review of Systems:  No other skin or systemic complaints except as noted in HPI or Assessment and Plan.  Objective          Well appearing patient in no apparent distress; mood and affect are within normal limits.   A focused examination was performed of the following areas: face, neck, and arms   Relevant exam findings are noted in the Assessment and Plan.    Assessment & Plan    IRRITANT CONTACT DERMATITIS (LUPUS TRIGGERED SECONDARY TO NEW PRODUCTS) Exam: Scaly pink papules and/or plaques  Treatment Plan: Discontinue Hydrocortisone cream.    - Resume using Neutrogena soap, as previously tolerated without issues.   - Use sunscreen regularly to protect your skin from sun sensitivity due to lupus. Advised to wear hats when outdoors.  Prednisone 10mg  taper Tacrolimus ointment twice daily for 2 weeks alternating with Clobetasol cream. Do not use Clobetasol on the face. -Continue Plaquenil  Advised to be cautious of sugar and caffeine intake -Will address hyperpigmentation once irritation has fully resilved     Return in about 1 month (around 06/08/2023) for irritant contact dermatits.  I,Michele Nyqvist CMA, am acting as scribe for [employer], DO.   Documentation: I have reviewed the above documentation for accuracy and completeness, and I agree with the  above.  Langston Reusing, DO

## 2023-05-08 NOTE — Patient Instructions (Addendum)
Thank you for visiting our clinic today. I appreciate your dedication to improving your health and managing your condition. Here is a summary of the key instructions from today's consultation:  - Medications Prescribed:   - Prednisone: 10 mg, start with four tablets daily for four days, then taper down as instructed over a 16-day period. Take all tablets in the morning.   - Tacrolimus Cream: To be used as a topical anti-inflammatory after pausing hydrocortisone.   - Clobetasol Cream: Apply twice daily on symptomatic areas of your arms for two weeks, then take a break. After two weeks, switch to Tacrolimus as discussed.  - Lifestyle Adjustments:   - Sun Protection: Always wear sunscreen and hats when outdoors. Continue using the Neutrogena mineral sunscreen you previously received.   - Dietary Caution: Be mindful of increased appetite and try to limit sugar intake, as Prednisone may raise your blood sugar levels.  - Monitoring:   - Keep an eye on blood pressure and blood sugar levels during Prednisone treatment.  - Follow-Up:   - Schedule a return visit in one month to assess progress and make any necessary adjustments to your treatment plan.  Please follow these instructions carefully and do not hesitate to contact our office if you have any questions or concerns.      Due to recent changes in healthcare laws, you may see results of your pathology and/or laboratory studies on MyChart before the doctors have had a chance to review them. We understand that in some cases there may be results that are confusing or concerning to you. Please understand that not all results are received at the same time and often the doctors may need to interpret multiple results in order to provide you with the best plan of care or course of treatment. Therefore, we ask that you please give [place] 2 business days to thoroughly review all your results before contacting the office for clarification. Should we see a  critical lab result, you will be contacted sooner.   If You Need Anything After Your Visit  If you have any questions or concerns for your doctor, please call our main line at [phone] If no one answers, please leave a voicemail as directed and we will return your call as soon as possible. Messages left after 4 pm will be answered the following business day.   You may also send [place] a message via MyChart. We typically respond to MyChart messages within 1-2 business days.  For prescription refills, please ask your pharmacy to contact our office. Our fax number is [phone].  If you have an urgent issue when the clinic is closed that cannot wait until the next business day, you can page your doctor at the number below.    Please note that while we do our best to be available for urgent issues outside of office hours, we are not available 24/7.   If you have an urgent issue and are unable to reach [place], you may choose to seek medical care at your doctor's office, retail clinic, urgent care center, or emergency room.  If you have a medical emergency, please immediately call 911 or go to the emergency department. In the event of inclement weather, please call our main line at [phone] for an update on the status of any delays or closures.  Dermatology Medication Tips: Please keep the boxes that topical medications come in in order to help keep track of the instructions about where and how to use these.  Pharmacies typically print the medication instructions only on the boxes and not directly on the medication tubes.   If your medication is too expensive, please contact our office at [phone] or send [place] a message through MyChart.   We are unable to tell what your co-pay for medications will be in advance as this is different depending on your insurance coverage. However, we may be able to find a substitute medication at lower cost or fill out paperwork to get insurance to cover a needed  medication.   If a prior authorization is required to get your medication covered by your insurance company, please allow [place] 1-2 business days to complete this process.  Drug prices often vary depending on where the prescription is filled and some pharmacies may offer cheaper prices.  The website www.goodrx.com contains coupons for medications through different pharmacies. The prices here do not account for what the cost may be with help from insurance (it may be cheaper with your insurance), but the website can give you the price if you did not use any insurance.  - You can print the associated coupon and take it with your prescription to the pharmacy.  - You may also stop by our office during regular business hours and pick up a GoodRx coupon card.  - If you need your prescription sent electronically to a different pharmacy, notify our office through [facility] or by phone at [phone]

## 2023-05-13 ENCOUNTER — Ambulatory Visit: Payer: Medicare PPO | Admitting: Family Medicine

## 2023-05-13 ENCOUNTER — Encounter: Payer: Self-pay | Admitting: Family Medicine

## 2023-05-13 VITALS — BP 138/80 | HR 65 | Temp 97.5°F | Ht 65.0 in | Wt 191.4 lb

## 2023-05-13 DIAGNOSIS — R040 Epistaxis: Secondary | ICD-10-CM | POA: Diagnosis not present

## 2023-05-13 DIAGNOSIS — D696 Thrombocytopenia, unspecified: Secondary | ICD-10-CM

## 2023-05-13 DIAGNOSIS — I10 Essential (primary) hypertension: Secondary | ICD-10-CM | POA: Diagnosis not present

## 2023-05-13 DIAGNOSIS — R21 Rash and other nonspecific skin eruption: Secondary | ICD-10-CM

## 2023-05-13 DIAGNOSIS — L243 Irritant contact dermatitis due to cosmetics: Secondary | ICD-10-CM | POA: Diagnosis not present

## 2023-05-13 DIAGNOSIS — L259 Unspecified contact dermatitis, unspecified cause: Secondary | ICD-10-CM | POA: Insufficient documentation

## 2023-05-13 NOTE — Assessment & Plan Note (Signed)
Last plt ct here 89 Has SLE Had recent nosebleed Will do labs next mo at rheum No new bruising

## 2023-05-13 NOTE — Assessment & Plan Note (Signed)
This is improved Notes from dermatology reviewed   Taking a prednisone taper Using tacrolimus and 2.5 % hydrocort cream Some hyperpigmented areas on face but overall clearing up  She has follow up planned with Dr Onalee Hua

## 2023-05-13 NOTE — Assessment & Plan Note (Signed)
bp in fair control at this time (much better on 2nd check) BP Readings from Last 1 Encounters:  05/13/23 138/80   Losartan 50 mg daily  Amlodipine 5 mg daily stable Do not think this played a role in epistaxis but blood pressure was elevated in ER during the nosebleed

## 2023-05-13 NOTE — Assessment & Plan Note (Signed)
Right nostril Seen in ER on 04/25/23 Reviewed hospital records, lab results and studies in detail   Doing better  After using afrin for last episode-no further episodes Scab present on right septum-suspect improved Discussed holding flonase Only use afrin for bleeding (along with direct pressure) Aquaphor to scab (scant) if needed   Update if this re occurs Call back and Er precautions noted in detail today

## 2023-05-13 NOTE — Progress Notes (Signed)
Subjective:    Patient ID: Jennifer Rose, female    DOB: 02-05-1956, 67 y.o.   MRN: [id number]  HPI  Wt Readings from Last 3 Encounters:  05/13/23 191 lb 6.4 oz (86.8 kg)  04/25/23 181 lb (82.1 kg)  03/29/23 189 lb 6 oz (85.9 kg)   31.85 kg/m  Vitals:   05/13/23 0857  BP: 138/80  Pulse: 65  Temp: (!) 97.5 F (36.4 C)  SpO2: 99%   Pt presents for ER follow up for nosebleed    Was seen on 7/4 in [facility] ED for epistaxis  Had a nosebleed from right nostril after blowing nose hard , lasted 10 min  Then picked nose later in day- another episode lasted 20 min and resolved on way to ER with direct pressure   No blood thinner use   Noted scab in nostril on exam  Has used afrin- used it twice / it really helped  No nosebleeds since   Flonase is on med list  Only uses prn  Not using now   Has baseline low plt count from lupus   HTN bp is stable today /controlled  No cp or palpitations or headaches or edema  No side effects to medicines  BP Readings from Last 3 Encounters:  05/13/23 138/80  05/08/23 131/82  04/25/23 (!) 155/112       Lab Results  Component Value Date   WBC 4.6 02/18/2023   HGB 12.4 02/18/2023   HCT 38.0 02/18/2023   MCV 79.4 02/18/2023   PLT 98.0 (L) 02/18/2023     Since last visit was seen by derm for irritant contact dermatitis  On prednisone Tacrolimus  Hydrocort cream 2.5 %   Getting better now   Heat is making her lupus worse this summer     Patient Active Problem List   Diagnosis Date Noted   Epistaxis 05/13/2023   Contact dermatitis 05/13/2023   Rash of neck 03/29/2023   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) 02/18/2023   Short-term memory loss 02/18/2023   Cognitive change 02/18/2023   Peptic duodenitis 09/17/2022   Barrett's esophagus 09/17/2022   Decreased GFR 04/19/2022   Hiccups 01/30/2022   Colon cancer screening 11/12/2019    H/O migraine 08/26/2018   Routine general medical examination at a health care facility 08/26/2018   Hypokalemia 08/26/2018   Thrombocytopenia (HCC) 04/30/2017   Prediabetes 12/13/2014   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 36.9 in adult 12/13/2014   GLAUCOMA 04/15/2009   LUPUS 11/09/2008   Chronic cough 09/24/2007   Allergic rhinitis 09/24/2007   Essential hypertension 03/22/2004   Past Medical History:  Diagnosis Date   Allergy    allergic rhinitis   Eye pressure    GERD (gastroesophageal reflux disease)    Hypertension 03/22/2004   Lupus (HCC)    Uterine fibroid 08/22/2000   per GYN   Past Surgical History:  Procedure Laterality Date   BREAST CYST EXCISION Right 36 yrs ago   x2   CESAREAN SECTION     x 3   CHOLECYSTECTOMY     ENDOMETRIAL ABLATION  01/2002   polyps?DUB   Social History   Tobacco Use   Smoking status: Never   Smokeless tobacco: Never  Vaping Use   Vaping status: Never Used  Substance Use Topics   Alcohol use: No    Alcohol/week: 0.0 standard drinks  of alcohol   Drug use: No   Family History  Problem Relation Age of Onset   Blindness Mother    Stroke Mother    Diabetes Mother    Glaucoma Mother    Hypertension Mother    Glaucoma Father    Diabetes Father    Hypertension Father    Prostate cancer Father    Stroke Sister    Diabetes Sister    Glaucoma Maternal Grandmother    Diabetes Maternal Grandmother    Hypertension Maternal Grandmother    Colon cancer Maternal Grandmother    Heart attack Maternal Grandfather    Glaucoma Paternal Grandmother    Heart disease Paternal Grandmother    Colitis Daughter    Hypertension Daughter    Diabetes Daughter    Allergies  Allergen Reactions   Shrimp Extract Shortness Of Breath    Throat swelling,   Guaifenesin Other (See Comments)    hiccups   Mucinex Clear & Cool Day-Night     Hiccups    Current Outpatient Medications on File Prior  to Visit  Medication Sig Dispense Refill   amLODipine (NORVASC) 5 MG tablet TAKE ONE TABLET BY MOUTH ONCE A DAY 90 tablet 1   cholecalciferol (VITAMIN D) 1000 UNITS tablet Take 2,000 Units by mouth daily.     clobetasol ointment (TEMOVATE) 0.05 % Apply 1 Application topically 2 (two) times daily. Apply to affected areas on the body twice daily for 2 weeks. Alternate with Tacrolimus. Do not use on face 30 g 2   fexofenadine (ALLEGRA) 180 MG tablet Take 180 mg by mouth daily as needed for allergies or rhinitis.     fluticasone (FLONASE) 50 MCG/ACT nasal spray USE 1 TO 2 SPRAYS IN EACH NOSTRIL DAILY 16 g 5   hydrocortisone 2.5 % cream Apply topically 2 (two) times daily as needed (Rash). 90 g 3   hydroxychloroquine (PLAQUENIL) 200 MG tablet Take 200 mg by mouth 2 (two) times daily.     latanoprost (XALATAN) 0.005 % ophthalmic solution Place 1 drop into both eyes as directed.     losartan (COZAAR) 50 MG tablet Take 1 tablet (50 mg total) by mouth daily. 90 tablet 1   meclizine (ANTIVERT) 12.5 MG tablet Take 12.5 mg by mouth 3 (three) times daily as needed.     mycophenolate (CELLCEPT) 500 MG tablet Take 1 tablet (500 mg total) by mouth 2 (two) times daily.     omeprazole (PRILOSEC) 20 MG capsule Take 1 capsule (20 mg total) by mouth 2 (two) times daily before a meal. 90 capsule 3   potassium chloride (KLOR-CON) 10 MEQ tablet Take 2 tablets (20 mEq total) by mouth daily. 180 tablet 3   predniSONE (DELTASONE) 10 MG tablet Take 4 tablets (40 mg total) by mouth with snacks for 4 days, THEN 3 tablets (30 mg total) with snacks for 4 days, THEN 2 tablets (20 mg total) with snacks for 4 days, THEN 1 tablet (10 mg total) with snacks for 4 days. Take as directed. 40 tablet 0   tacrolimus (PROTOPIC) 0.1 % ointment Apply topically 2 (two) times daily. Apply to affected areas twice daily for 2 weeks.  Alternate with Clobetasol cream on areas on body 100 g 2   No current facility-administered medications on file  prior to visit.    Review of Systems  Constitutional:  Positive for fatigue. Negative for activity change, appetite change, fever and unexpected weight change.  HENT:  Positive for nosebleeds. Negative for congestion, ear  pain, rhinorrhea, sinus pressure and sore throat.   Eyes:  Negative for pain, redness and visual disturbance.  Respiratory:  Negative for cough, shortness of breath and wheezing.   Cardiovascular:  Negative for chest pain and palpitations.  Gastrointestinal:  Negative for abdominal pain, blood in stool, constipation and diarrhea.  Endocrine: Negative for polydipsia and polyuria.  Genitourinary:  Negative for dysuria, frequency and urgency.  Musculoskeletal:  Positive for arthralgias. Negative for back pain and myalgias.  Skin:  Negative for pallor and rash.  Allergic/Immunologic: Negative for environmental allergies.  Neurological:  Negative for dizziness, syncope and headaches.  Hematological:  Negative for adenopathy. Does not bruise/bleed easily.  Psychiatric/Behavioral:  Negative for decreased concentration and dysphoric mood. The patient is not nervous/anxious.        Objective:   Physical Exam Constitutional:      General: She is not in acute distress.    Appearance: Normal appearance. She is well-developed. She is obese. She is not ill-appearing or diaphoretic.  HENT:     Head: Normocephalic and atraumatic.     Nose:     Comments: Very small scab on septum of right nostril No active bleeding Nares are pale and boggy       Mouth/Throat:     Mouth: Mucous membranes are moist.  Eyes:     Conjunctiva/sclera: Conjunctivae normal.     Pupils: Pupils are equal, round, and reactive to light.  Neck:     Thyroid: No thyromegaly.     Vascular: No carotid bruit or JVD.  Cardiovascular:     Rate and Rhythm: Normal rate and regular rhythm.     Heart sounds: Normal heart sounds.     No gallop.  Pulmonary:     Effort: Pulmonary effort is normal. No respiratory  distress.     Breath sounds: Normal breath sounds. No wheezing or rales.  Abdominal:     General: There is no distension or abdominal bruit.     Palpations: Abdomen is soft.  Musculoskeletal:     Cervical back: Normal range of motion and neck supple.     Right lower leg: No edema.     Left lower leg: No edema.  Lymphadenopathy:     Cervical: No cervical adenopathy.  Skin:    General: Skin is warm and dry.     Coloration: Skin is not jaundiced or pale.     Findings: No bruising or rash.  Neurological:     Mental Status: She is alert.     Coordination: Coordination normal.     Deep Tendon Reflexes: Reflexes are normal and symmetric. Reflexes normal.  Psychiatric:        Mood and Affect: Mood normal.           Assessment & Plan:   Problem List Items Addressed This Visit       Cardiovascular and Mediastinum   Essential hypertension    bp in fair control at this time (much better on 2nd check) BP Readings from Last 1 Encounters:  05/13/23 138/80   Losartan 50 mg daily  Amlodipine 5 mg daily stable Do not think this played a role in epistaxis but blood pressure was elevated in ER during the nosebleed         Musculoskeletal and Integument   Rash of neck    Suspect part of her irritant contact dermatitis See a/p for that       Contact dermatitis    This is improved Notes from dermatology reviewed  Taking a prednisone taper Using tacrolimus and 2.5 % hydrocort cream Some hyperpigmented areas on face but overall clearing up  She has follow up planned with Dr Onalee Hua         Hematopoietic and Hemostatic   Thrombocytopenia (HCC)    Last plt ct here 89 Has SLE Had recent nosebleed Will do labs next mo at rheum No new bruising         Other   Epistaxis - Primary    Right nostril Seen in ER on 04/25/23 Reviewed hospital records, lab results and studies in detail   Doing better  After using afrin for last episode-no further episodes Scab present on right  septum-suspect improved Discussed holding flonase Only use afrin for bleeding (along with direct pressure) Aquaphor to scab (scant) if needed   Update if this re occurs Call back and Er precautions noted in detail today

## 2023-05-13 NOTE — Patient Instructions (Signed)
If you need to- you can try a tiny bit of aquaphor in nostril over the scabl  In dry weather- use a vaporizer (not needed now)   Do not use flonase for 2 more weeks   Be gentle with nose Do not blow your nose hard   Only use afrin if you have a nosebleed   Use direct pressure as well for at least 15 minutes   Get your labs from rheumatology - I am interested in platelet count

## 2023-05-13 NOTE — Assessment & Plan Note (Signed)
Suspect part of her irritant contact dermatitis See a/p for that

## 2023-06-07 ENCOUNTER — Other Ambulatory Visit: Payer: Self-pay | Admitting: Family Medicine

## 2023-06-13 ENCOUNTER — Ambulatory Visit (INDEPENDENT_AMBULATORY_CARE_PROVIDER_SITE_OTHER): Payer: Medicare PPO | Admitting: Dermatology

## 2023-06-13 DIAGNOSIS — L259 Unspecified contact dermatitis, unspecified cause: Secondary | ICD-10-CM

## 2023-06-13 DIAGNOSIS — L80 Vitiligo: Secondary | ICD-10-CM | POA: Diagnosis not present

## 2023-06-13 DIAGNOSIS — L309 Dermatitis, unspecified: Secondary | ICD-10-CM

## 2023-06-13 DIAGNOSIS — L810 Postinflammatory hyperpigmentation: Secondary | ICD-10-CM

## 2023-06-13 NOTE — Progress Notes (Signed)
   Follow-Up Visit   Subjective  Jennifer Rose is a 67 y.o. female who presents for the following: Dermatitis  Patient present today for follow up visit for Dermatitis. Patient was last evaluated on 05/08/23. Patient reports sxs are Better/not at goal/ resolved. Patient reports no medication changes. At pt previous visit pt was prescribed Prednisone Taper, Clobetasol Cream to apply to the effected area (NOT FACE) and Tacrolimus to alternate with clobetasol until follow up visit. Patient reports she is currently using Clobetasol 2 times daily and the tacrolimus 2 times daily. Patient stated she was using both creams on her face because she wasn't instructed by clinical staff member NOT to use on her face.   The following portions of the chart were reviewed this encounter and updated as appropriate: medications, allergies, medical history  Review of Systems:  No other skin or systemic complaints except as noted in HPI or Assessment and Plan.  Objective  Well appearing patient in no apparent distress; mood and affect are within normal limits.  A focused examination was performed of the following areas: Face  Relevant exam findings are noted in the Assessment and Plan.                 Assessment & Plan   IRRITANT CONTACT DERMATITIS (LUPUS TRIGGERED SECONDARY TO NEW PRODUCTS)  Exam: Hyperpigmented patches surrounded by Hypopigmentation, no longer indurated or elevated   Treatment Plan: - Patient instructed to d/c Clobetasol Cream on face - Patient instructed to continue Tacrolimus on the PIH dark areas on the face - We will plan to follow up in 3 months to track progress  VITILIGO Exam: Depigmented patches on right Axilla  Vitiligo is a chronic autoimmune condition which causes loss of skin pigment and is commonly seen on the face and may also involve areas of trauma like hands, elbows, knees, and ankles. There is no cure and it is difficult to treat.  Treatments include topical  steroids and other topical anti-inflammatory ointments/creams and topical and oral Jak inhibitors.  Sometimes narrow band UV light therapy or Xtrac laser is helpful, both of which require twice weekly treatments for at least 3-6 months.  Antioxidant vitamins, such as Vitamins A,C,E,D, Folic Acid and B12 may be added to enhance treatment. Heliocare may also enhance treatment results.  Treatment Plan: - Instructed to apply Tacrolimus to the effected areas 2 times daily - We will plan to follow up in 3 Months  No follow-ups on file.   Documentation: I have reviewed the above documentation for accuracy and completeness, and I agree with the above.  Stasia Cavalier, am acting as scribe for Langston Reusing, DO.  Langston Reusing, DO

## 2023-06-13 NOTE — Patient Instructions (Addendum)
Hello Jennifer Rose,  Thank you for visiting [place] again and for your dedication to improving your health. It was good to review the progress of your treatment for irritant dermatitis and discuss the management of your lupus and vitiligo. Here is a summary of the key instructions from our appointment:  - Medication Adjustments:   - Discontinue Clobetasol: Stop using Clobetasol immediately on your face.   - Continue Tacrolimus: Apply Tacrolimus daily on the affected areas, including your face, neck, and underarms, to control inflammation and aid in pigment return.  - Skin Care and Lifestyle:   - Sun Exposure: Avoid sun exposure to manage your lupus effectively.   - Tacrolimus Use: Continue using Tacrolimus for the next three months to see improvements in pigmentation.  - Follow-Up and Monitoring:   - Immediate Contact: If you experience any flares or issues, please contact our office immediately.   - Next Appointment: Schedule a follow-up appointment in three months to assess progress.  - Additional Guidance:   - Clobetasol for Flares: For acute lupus flares or bug bites, you may use Clobetasol, but limit its use to no more than two weeks.   - Dietary Considerations: Be mindful of your diet as it can influence your body's reaction to insects, not due to sugar levels but due to the byproducts of what you consume.  Please follow these instructions carefully to manage your conditions effectively. If you have any questions or need further clarification, do not hesitate to reach out.  Warm regards,  Dr. Langston Reusing,  Dermatology  Important Information  Due to recent changes in healthcare laws, you may see results of your pathology and/or laboratory studies on MyChart before the doctors have had a chance to review them. We understand that in some cases there may be results that are confusing or concerning to you. Please understand that not all results are received at the same time and often the doctors  may need to interpret multiple results in order to provide you with the best plan of care or course of treatment. Therefore, we ask that you please give [place] 2 business days to thoroughly review all your results before contacting the office for clarification. Should we see a critical lab result, you will be contacted sooner.   If You Need Anything After Your Visit  If you have any questions or concerns for your doctor, please call our main line at [phone] If no one answers, please leave a voicemail as directed and we will return your call as soon as possible. Messages left after 4 pm will be answered the following business day.   You may also send [place] a message via MyChart. We typically respond to MyChart messages within 1-2 business days.  For prescription refills, please ask your pharmacy to contact our office. Our fax number is [phone].  If you have an urgent issue when the clinic is closed that cannot wait until the next business day, you can page your doctor at the number below.    Please note that while we do our best to be available for urgent issues outside of office hours, we are not available 24/7.   If you have an urgent issue and are unable to reach [place], you may choose to seek medical care at your doctor's office, retail clinic, urgent care center, or emergency room.  If you have a medical emergency, please immediately call 911 or go to the emergency department. In the event of inclement weather, please call our main  line at [phone] for an update on the status of any delays or closures.  Dermatology Medication Tips: Please keep the boxes that topical medications come in in order to help keep track of the instructions about where and how to use these. Pharmacies typically print the medication instructions only on the boxes and not directly on the medication tubes.   If your medication is too expensive, please contact our office at [phone] or send [place] a message through  MyChart.   We are unable to tell what your co-pay for medications will be in advance as this is different depending on your insurance coverage. However, we may be able to find a substitute medication at lower cost or fill out paperwork to get insurance to cover a needed medication.   If a prior authorization is required to get your medication covered by your insurance company, please allow [place] 1-2 business days to complete this process.  Drug prices often vary depending on where the prescription is filled and some pharmacies may offer cheaper prices.  The website www.goodrx.com contains coupons for medications through different pharmacies. The prices here do not account for what the cost may be with help from insurance (it may be cheaper with your insurance), but the website can give you the price if you did not use any insurance.  - You can print the associated coupon and take it with your prescription to the pharmacy.  - You may also stop by our office during regular business hours and pick up a GoodRx coupon card.  - If you need your prescription sent electronically to a different pharmacy, notify our office through [facility] or by phone at [phone]

## 2023-07-08 ENCOUNTER — Observation Stay (HOSPITAL_COMMUNITY): Payer: Medicare PPO

## 2023-07-08 ENCOUNTER — Other Ambulatory Visit (HOSPITAL_BASED_OUTPATIENT_CLINIC_OR_DEPARTMENT_OTHER): Payer: Self-pay

## 2023-07-08 ENCOUNTER — Observation Stay (HOSPITAL_BASED_OUTPATIENT_CLINIC_OR_DEPARTMENT_OTHER)
Admission: EM | Admit: 2023-07-08 | Discharge: 2023-07-09 | Disposition: A | Discharge status: Home / Self Care | Payer: Medicare PPO | Attending: Internal Medicine | Admitting: Internal Medicine

## 2023-07-08 ENCOUNTER — Emergency Department (HOSPITAL_BASED_OUTPATIENT_CLINIC_OR_DEPARTMENT_OTHER): Payer: Medicare PPO

## 2023-07-08 ENCOUNTER — Encounter (HOSPITAL_BASED_OUTPATIENT_CLINIC_OR_DEPARTMENT_OTHER): Payer: Self-pay

## 2023-07-08 DIAGNOSIS — Z87898 Personal history of other specified conditions: Secondary | ICD-10-CM

## 2023-07-08 DIAGNOSIS — G9340 Encephalopathy, unspecified: Principal | ICD-10-CM | POA: Diagnosis present

## 2023-07-08 DIAGNOSIS — I160 Hypertensive urgency: Secondary | ICD-10-CM | POA: Diagnosis not present

## 2023-07-08 DIAGNOSIS — M329 Systemic lupus erythematosus, unspecified: Secondary | ICD-10-CM | POA: Diagnosis present

## 2023-07-08 DIAGNOSIS — N183 Chronic kidney disease, stage 3 unspecified: Secondary | ICD-10-CM | POA: Insufficient documentation

## 2023-07-08 DIAGNOSIS — D693 Immune thrombocytopenic purpura: Secondary | ICD-10-CM | POA: Diagnosis not present

## 2023-07-08 DIAGNOSIS — I129 Hypertensive chronic kidney disease with stage 1 through stage 4 chronic kidney disease, or unspecified chronic kidney disease: Secondary | ICD-10-CM | POA: Diagnosis not present

## 2023-07-08 DIAGNOSIS — N1832 Chronic kidney disease, stage 3b: Secondary | ICD-10-CM | POA: Insufficient documentation

## 2023-07-08 DIAGNOSIS — R9089 Other abnormal findings on diagnostic imaging of central nervous system: Secondary | ICD-10-CM | POA: Diagnosis not present

## 2023-07-08 DIAGNOSIS — E119 Type 2 diabetes mellitus without complications: Secondary | ICD-10-CM | POA: Diagnosis not present

## 2023-07-08 DIAGNOSIS — N1831 Chronic kidney disease, stage 3a: Secondary | ICD-10-CM | POA: Diagnosis not present

## 2023-07-08 DIAGNOSIS — E1122 Type 2 diabetes mellitus with diabetic chronic kidney disease: Secondary | ICD-10-CM | POA: Diagnosis not present

## 2023-07-08 DIAGNOSIS — R4182 Altered mental status, unspecified: Secondary | ICD-10-CM | POA: Diagnosis not present

## 2023-07-08 DIAGNOSIS — R410 Disorientation, unspecified: Secondary | ICD-10-CM | POA: Diagnosis not present

## 2023-07-08 DIAGNOSIS — I10 Essential (primary) hypertension: Secondary | ICD-10-CM | POA: Diagnosis present

## 2023-07-08 DIAGNOSIS — I6782 Cerebral ischemia: Secondary | ICD-10-CM | POA: Diagnosis not present

## 2023-07-08 LAB — URINALYSIS, ROUTINE W REFLEX MICROSCOPIC
Bacteria, UA: NONE SEEN
Bilirubin Urine: NEGATIVE
Glucose, UA: NEGATIVE mg/dL
Hgb urine dipstick: NEGATIVE
Ketones, ur: NEGATIVE mg/dL
Leukocytes,Ua: NEGATIVE
Nitrite: NEGATIVE
Protein, ur: 100 mg/dL — AB
Specific Gravity, Urine: 1.015 (ref 1.005–1.030)
pH: 7 (ref 5.0–8.0)

## 2023-07-08 LAB — COMPREHENSIVE METABOLIC PANEL
ALT: 12 U/L (ref 0–44)
AST: 19 U/L (ref 15–41)
Albumin: 4 g/dL (ref 3.5–5.0)
Alkaline Phosphatase: 92 U/L (ref 38–126)
Anion gap: 9 (ref 5–15)
BUN: 21 mg/dL (ref 8–23)
CO2: 27 mmol/L (ref 22–32)
Calcium: 9.6 mg/dL (ref 8.9–10.3)
Chloride: 105 mmol/L (ref 98–111)
Creatinine, Ser: 1.29 mg/dL — ABNORMAL HIGH (ref 0.44–1.00)
GFR, Estimated: 46 mL/min — ABNORMAL LOW (ref >60)
Glucose, Bld: 100 mg/dL — ABNORMAL HIGH (ref 70–99)
Potassium: 4.2 mmol/L (ref 3.5–5.1)
Sodium: 141 mmol/L (ref 135–145)
Total Bilirubin: 0.5 mg/dL (ref 0.3–1.2)
Total Protein: 7.4 g/dL (ref 6.5–8.1)

## 2023-07-08 LAB — CBC WITH DIFFERENTIAL/PLATELET
Abs Immature Granulocytes: 0.01 10*3/uL (ref 0.00–0.07)
Basophils Absolute: 0 10*3/uL (ref 0.0–0.1)
Basophils Relative: 0 %
Eosinophils Absolute: 0.1 10*3/uL (ref 0.0–0.5)
Eosinophils Relative: 2 %
HCT: 41.1 % (ref 36.0–46.0)
Hemoglobin: 13.4 g/dL (ref 12.0–15.0)
Immature Granulocytes: 0 %
Lymphocytes Relative: 14 %
Lymphs Abs: 0.6 10*3/uL — ABNORMAL LOW (ref 0.7–4.0)
MCH: 26.3 pg (ref 26.0–34.0)
MCHC: 32.6 g/dL (ref 30.0–36.0)
MCV: 80.6 fL (ref 80.0–100.0)
Monocytes Absolute: 0.6 10*3/uL (ref 0.1–1.0)
Monocytes Relative: 13 %
Neutro Abs: 3.2 10*3/uL (ref 1.7–7.7)
Neutrophils Relative %: 71 %
Platelets: 80 10*3/uL — ABNORMAL LOW (ref 150–400)
RBC: 5.1 MIL/uL (ref 3.87–5.11)
RDW: 13.1 % (ref 11.5–15.5)
WBC: 4.5 10*3/uL (ref 4.0–10.5)
nRBC: 0 % (ref 0.0–0.2)

## 2023-07-08 LAB — URINALYSIS, W/ REFLEX TO CULTURE (INFECTION SUSPECTED)
Bacteria, UA: NONE SEEN
Bilirubin Urine: NEGATIVE
Glucose, UA: NEGATIVE mg/dL
Hgb urine dipstick: NEGATIVE
Ketones, ur: NEGATIVE mg/dL
Leukocytes,Ua: NEGATIVE
Nitrite: NEGATIVE
Protein, ur: 30 mg/dL — AB
Specific Gravity, Urine: 1.014 (ref 1.005–1.030)
pH: 7 (ref 5.0–8.0)

## 2023-07-08 LAB — ETHANOL: Alcohol, Ethyl (B): <10 mg/dL (ref <10)

## 2023-07-08 LAB — SODIUM, URINE, RANDOM: Sodium, Ur: 164 mmol/L

## 2023-07-08 LAB — CREATININE, URINE, RANDOM: Creatinine, Urine: 110 mg/dL

## 2023-07-08 LAB — GLUCOSE, CAPILLARY: Glucose-Capillary: 124 mg/dL — ABNORMAL HIGH (ref 70–99)

## 2023-07-08 MED ORDER — LORAZEPAM 2 MG/ML IJ SOLN
0.5000 mg | Freq: Once | INTRAMUSCULAR | Status: AC
  Administered 2023-07-08: 0.5 mg via INTRAVENOUS
  Filled 2023-07-08: qty 1

## 2023-07-08 MED ORDER — ONDANSETRON HCL 4 MG/2ML IJ SOLN: 4.0000 mg | Freq: Four times a day (QID) | INTRAMUSCULAR | Status: DC | PRN

## 2023-07-08 MED ORDER — ONDANSETRON HCL 4 MG PO TABS: 4.0000 mg | ORAL_TABLET | Freq: Four times a day (QID) | ORAL | Status: DC | PRN

## 2023-07-08 MED ORDER — TACROLIMUS 0.1 % EX OINT: TOPICAL_OINTMENT | Freq: Two times a day (BID) | CUTANEOUS | Status: DC

## 2023-07-08 MED ORDER — AMLODIPINE BESYLATE 5 MG PO TABS
5.0000 mg | ORAL_TABLET | Freq: Every day | ORAL | Status: DC
  Administered 2023-07-08 – 2023-07-09 (×2): 5 mg via ORAL
  Filled 2023-07-08 (×2): qty 1

## 2023-07-08 MED ORDER — LOSARTAN POTASSIUM 50 MG PO TABS
50.0000 mg | ORAL_TABLET | Freq: Every day | ORAL | Status: DC
  Administered 2023-07-08 – 2023-07-09 (×2): 50 mg via ORAL
  Filled 2023-07-08 (×2): qty 1

## 2023-07-08 MED ORDER — PANTOPRAZOLE SODIUM 40 MG PO TBEC
40.0000 mg | DELAYED_RELEASE_TABLET | Freq: Every day | ORAL | Status: DC
  Administered 2023-07-08 – 2023-07-09 (×2): 40 mg via ORAL
  Filled 2023-07-08 (×2): qty 1

## 2023-07-08 MED ORDER — SODIUM CHLORIDE 0.9% FLUSH: 3.0000 mL | INTRAVENOUS | Status: DC | PRN

## 2023-07-08 MED ORDER — IBUPROFEN 200 MG PO TABS: 400.0000 mg | ORAL_TABLET | Freq: Four times a day (QID) | ORAL | Status: DC | PRN

## 2023-07-08 MED ORDER — HYDRALAZINE HCL 20 MG/ML IJ SOLN: 10.0000 mg | Freq: Four times a day (QID) | INTRAMUSCULAR | Status: DC | PRN

## 2023-07-08 MED ORDER — INSULIN ASPART 100 UNIT/ML IJ SOLN: 0.0000 [IU] | Freq: Three times a day (TID) | INTRAMUSCULAR | Status: DC

## 2023-07-08 MED ORDER — SODIUM CHLORIDE 0.9 % IV SOLN: 250.0000 mL | INTRAVENOUS | Status: DC | PRN

## 2023-07-08 MED ORDER — GADOBUTROL 1 MMOL/ML IV SOLN
8.0000 mL | Freq: Once | INTRAVENOUS | Status: AC | PRN
  Administered 2023-07-08: 8 mL via INTRAVENOUS

## 2023-07-08 MED ORDER — INSULIN ASPART 100 UNIT/ML IJ SOLN: 0.0000 [IU] | Freq: Every day | INTRAMUSCULAR | Status: DC

## 2023-07-08 MED ORDER — SODIUM CHLORIDE 0.9% FLUSH
3.0000 mL | Freq: Two times a day (BID) | INTRAVENOUS | Status: DC
  Administered 2023-07-08: 3 mL via INTRAVENOUS

## 2023-07-08 MED ORDER — SENNOSIDES-DOCUSATE SODIUM 8.6-50 MG PO TABS: 1.0000 | ORAL_TABLET | Freq: Every evening | ORAL | Status: DC | PRN

## 2023-07-08 MED ORDER — HYDROXYCHLOROQUINE SULFATE 200 MG PO TABS
200.0000 mg | ORAL_TABLET | Freq: Two times a day (BID) | ORAL | Status: DC
  Administered 2023-07-08 – 2023-07-09 (×2): 200 mg via ORAL
  Filled 2023-07-08 (×2): qty 1

## 2023-07-08 NOTE — ED Notes (Signed)
[facility] at bedside to transport pt to [facility]

## 2023-07-08 NOTE — Progress Notes (Signed)
Neurology contacted, no info on order. Reached out to team and ordering provider, unable to reach Dr. Janalyn Shy, message left, will follow up as schedule allows.

## 2023-07-08 NOTE — Progress Notes (Addendum)
MRI of the brain without contrast IMPRESSION: 1. No acute intracranial process. No evidence of acute or subacute infarct. 2. 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   -No significant acute change/finding of the MRI of the brain brain.  Will follow-up with EEG.  Neurology following patient.  Appreciate neurology.  Tereasa Coop, MD [facility] 07/08/2023, 11:55 PM

## 2023-07-08 NOTE — Progress Notes (Signed)
Called by EDP Dr.Pickering, Jennifer Rose, 67/F with history of lupus and some cognitive deficits was brought to [facility] by daughter today after she got lost driving to a familiar place. Allegedly change from baseline, previously was taken off some lupus meds for cognitive effects. ED workup unremarkable, Ct head with chronic microvascular ischemia, EDP d/w Neuro who recommended admission for MRI and EEG. Accepted to Medsurg bed at [facility]  Jennifer Cove MD

## 2023-07-08 NOTE — Plan of Care (Signed)
Recommendation by Dr. Wilford Corner was routine EEG. LTM EEG order changed to routine EEG.   Electronically signed: Dr. Caryl Pina

## 2023-07-08 NOTE — Progress Notes (Signed)
Overnight orders can only be put in [place] [facility]. Checking order status

## 2023-07-08 NOTE — ED Notes (Signed)
Dr Rubin Payor at bedside

## 2023-07-08 NOTE — ED Triage Notes (Signed)
Pt w family, concern for AMS, family states intermittent episodes since Friday, today "it's just overall confusion rather than coming out."  Endorses "slight" HA, weakness Hx lupus, per family, endorses weakness "for awhile," as well as sinus HA. Familial hx stroke  Onset 0930 this AM

## 2023-07-08 NOTE — ED Notes (Signed)
Kim at [facility] called for transport to [facility] [address].
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
## 2023-07-08 NOTE — ED Provider Notes (Addendum)
[place] [facility]PARTMENT AT [facility] Provider Note   CSN: [id number] Arrival date & time: 07/08/23  1044     History  Chief Complaint  Patient presents with   Altered Mental Status    Jennifer Rose is a 67 y.o. female.   Altered Mental Status Patient presents with altered mental status.  Confusion.  Was going to go to her house to meet a tow truck driver and got lost on the way.  Does not remember calling her daughter.  Had had some confusion previously that was thought to be related to some lupus medicines that she is no longer on.  Has had somewhat decreased oral intake overall.  Has had hiatal hernia and Barrett's esophagus and family thought it was related to that.  Does have a history of lupus.  Still mildly slow to answer but otherwise awake and answers questions.  Patient also was at a funeral 2 days ago and it was somewhat stressful for her.    Past Medical History:  Diagnosis Date   Allergy    allergic rhinitis   Eye pressure    GERD (gastroesophageal reflux disease)    Hypertension 03/22/2004   Lupus (HCC)    Uterine fibroid 08/22/2000   per GYN    Home Medications Prior to Admission medications   Medication Sig Start Date End Date Taking? Authorizing Provider  amLODipine (NORVASC) 5 MG tablet TAKE ONE TABLET BY MOUTH ONCE A DAY 03/22/23   Tower, Audrie Gallus, MD  cholecalciferol (VITAMIN D) 1000 UNITS tablet Take 2,000 Units by mouth daily.    [provider]  clobetasol ointment (TEMOVATE) 0.05 % Apply 1 Application topically 2 (two) times daily. Apply to affected areas on the body twice daily for 2 weeks. Alternate with Tacrolimus. Do not use on face 05/08/23   Terri Piedra, DO  fexofenadine (ALLEGRA) 180 MG tablet Take 180 mg by mouth daily as needed for allergies or rhinitis.    [provider]  fluticasone (FLONASE) 50 MCG/ACT nasal spray USE 1 TO 2 SPRAYS IN EACH NOSTRIL DAILY 05/29/22   Tower, Oxford A, MD  hydrocortisone  2.5 % cream Apply topically 2 (two) times daily as needed (Rash). 04/30/23   Terri Piedra, DO  hydroxychloroquine (PLAQUENIL) 200 MG tablet Take 200 mg by mouth 2 (two) times daily.    [provider]  latanoprost (XALATAN) 0.005 % ophthalmic solution Place 1 drop into both eyes as directed.    [provider]  losartan (COZAAR) 50 MG tablet TAKE ONE TABLET BY MOUTH ONCE A DAY 06/07/23   Tower, Audrie Gallus, MD  meclizine (ANTIVERT) 12.5 MG tablet Take 12.5 mg by mouth 3 (three) times daily as needed.    [provider]  mycophenolate (CELLCEPT) 500 MG tablet Take 1 tablet (500 mg total) by mouth 2 (two) times daily. 05/10/22   Joaquim Nam, MD  omeprazole (PRILOSEC) 20 MG capsule Take 1 capsule (20 mg total) by mouth 2 (two) times daily before a meal. 04/11/23   Jenel Lucks, MD  potassium chloride (KLOR-CON) 10 MEQ tablet Take 2 tablets (20 mEq total) by mouth daily. 11/15/20   Tower, Audrie Gallus, MD  tacrolimus (PROTOPIC) 0.1 % ointment Apply topically 2 (two) times daily. Apply to affected areas twice daily for 2 weeks.  Alternate with Clobetasol cream on areas on body 05/08/23   Terri Piedra, DO      Allergies    Shrimp extract, Guaifenesin, and Mucinex  clear & cool day-night    Review of Systems   Review of Systems  Physical Exam Updated Vital Signs BP (!) 170/79   Pulse 66   Temp 97.8 F (36.6 C)   Resp 20   SpO2 100%  Physical Exam Vitals and nursing note reviewed.  HENT:     Head: Normocephalic.  Cardiovascular:     Rate and Rhythm: Regular rhythm.  Pulmonary:     Breath sounds: No wheezing.  Abdominal:     Tenderness: There is no abdominal tenderness.  Musculoskeletal:        General: No tenderness.     Cervical back: Neck supple.  Neurological:     Mental Status: She is alert and oriented to person, place, and time.     Comments: Awake and appropriate, however does not remember some of the events of the day.     ED Results /  Procedures / Treatments   Labs (all labs ordered are listed, but only abnormal results are displayed) Labs Reviewed  COMPREHENSIVE METABOLIC PANEL - Abnormal; Notable for the following components:      Result Value   Glucose, Bld 100 (*)    Creatinine, Ser 1.29 (*)    GFR, Estimated 46 (*)    All other components within normal limits  URINALYSIS, W/ REFLEX TO CULTURE (INFECTION SUSPECTED) - Abnormal; Notable for the following components:   Protein, ur 30 (*)    All other components within normal limits  CBC WITH DIFFERENTIAL/PLATELET - Abnormal; Notable for the following components:   Platelets 80 (*)    Lymphs Abs 0.6 (*)    All other components within normal limits  ETHANOL    EKG None  Radiology CT Head Wo Contrast  Result Date: 07/08/2023 CLINICAL DATA:  Delirium EXAM: CT HEAD WITHOUT CONTRAST TECHNIQUE: Contiguous axial images were obtained from the base of the skull through the vertex without intravenous contrast. RADIATION DOSE REDUCTION: This exam was performed according to the departmental dose-optimization program which includes automated exposure control, adjustment of the mA and/or kV according to patient size and/or use of iterative reconstruction technique. COMPARISON:  CT Head 12/07/21 FINDINGS: Brain: No evidence of acute infarction, hemorrhage, hydrocephalus, extra-axial collection or mass lesion/mass effect. Sequela of severe chronic microvascular ischemic change. Vascular: No hyperdense vessel or unexpected calcification. Skull: Normal. Negative for fracture or focal lesion. Sinuses/Orbits: No acute finding. Other: None. IMPRESSION: No acute intracranial abnormality. Sequela of severe chronic microvascular ischemic change. Electronically Signed   By: Lorenza Cambridge M.D.   On: 07/08/2023 14:14   DG Chest Portable 1 View  Result Date: 07/08/2023 CLINICAL DATA:  Altered mental status. EXAM: PORTABLE CHEST 1 VIEW COMPARISON:  01/31/2022. FINDINGS: Bilateral lung fields are  clear. Bilateral costophrenic angles are clear. Normal cardio-mediastinal silhouette. No acute osseous abnormalities. The soft tissues are within normal limits. IMPRESSION: No active disease. Electronically Signed   By: Jules Schick M.D.   On: 07/08/2023 12:48    Procedures Procedures    Medications Ordered in ED Medications - No data to display  ED Course/ Medical Decision Making/ A&P                                 Medical Decision Making Amount and/or Complexity of Data Reviewed Labs: ordered. Radiology: ordered.   Patient with confusion/mental status change.  Nonfocal exam otherwise.  Slight headache.  History of lupus.  Differential diagnoses long but includes  infection, lupus related, dehydration, intracranial pathology.  Will get CT scan of the head.  Will get basic blood work and urinalysis.  Reviewed previous PCP note from when she had some memory issues previously.  Blood work reassuring but does show worsening thrombocytopenia.  Still not at baseline.  Still having some confusion.  I think with acute worsening of memory issues would benefit from inpatient admission for further workup such as MRI.  I think potential causes do include lupus or potentially even cause such as stroke.  Will discuss with hospitalist for admission.  Discussed with neurology under request of hospitalist.  Neurology suggests MRI with and without contrast.  Also suggest EEG.  Can be consulted officially if needed.         Final Clinical Impression(s) / ED Diagnoses Final diagnoses:  Encephalopathy    Rx / DC Orders ED Discharge Orders     None         Benjiman Core, MD 07/08/23 1504    Benjiman Core, MD 07/08/23 1531

## 2023-07-08 NOTE — Plan of Care (Signed)
On-call neurologist note  Called by Dr. Rubin Payor at [facility].  Patient with history of lupus presents with altered mental status going greater than 24 hours.  Acting confused.  Does not remember having called family multiple times.  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  Agree with further workup in the form of imaging.  Would recommend MRI of the brain with and without contrast.  Would also recommend checking an EEG.  Obtain inpatient consultation if mentation does not clear up and if there are concerns or questions on MRI and EEG.  -- Milon Dikes, MD Neurologist [facility] Pager: [phone]

## 2023-07-09 ENCOUNTER — Observation Stay (HOSPITAL_COMMUNITY): Payer: Medicare PPO

## 2023-07-09 ENCOUNTER — Other Ambulatory Visit: Payer: Self-pay

## 2023-07-09 DIAGNOSIS — R569 Unspecified convulsions: Secondary | ICD-10-CM

## 2023-07-09 DIAGNOSIS — N261 Atrophy of kidney (terminal): Secondary | ICD-10-CM | POA: Diagnosis not present

## 2023-07-09 DIAGNOSIS — G9340 Encephalopathy, unspecified: Secondary | ICD-10-CM | POA: Diagnosis not present

## 2023-07-09 DIAGNOSIS — N189 Chronic kidney disease, unspecified: Secondary | ICD-10-CM | POA: Diagnosis not present

## 2023-07-09 LAB — CBC
HCT: 38.2 % (ref 36.0–46.0)
Hemoglobin: 12.4 g/dL (ref 12.0–15.0)
MCH: 26.3 pg (ref 26.0–34.0)
MCHC: 32.5 g/dL (ref 30.0–36.0)
MCV: 81.1 fL (ref 80.0–100.0)
Platelets: 74 10*3/uL — ABNORMAL LOW (ref 150–400)
RBC: 4.71 MIL/uL (ref 3.87–5.11)
RDW: 13.1 % (ref 11.5–15.5)
WBC: 3.8 10*3/uL — ABNORMAL LOW (ref 4.0–10.5)
nRBC: 0 % (ref 0.0–0.2)

## 2023-07-09 LAB — COMPREHENSIVE METABOLIC PANEL
ALT: 13 U/L (ref 0–44)
AST: 20 U/L (ref 15–41)
Albumin: 3.2 g/dL — ABNORMAL LOW (ref 3.5–5.0)
Alkaline Phosphatase: 81 U/L (ref 38–126)
Anion gap: 6 (ref 5–15)
BUN: 16 mg/dL (ref 8–23)
CO2: 26 mmol/L (ref 22–32)
Calcium: 9 mg/dL (ref 8.9–10.3)
Chloride: 106 mmol/L (ref 98–111)
Creatinine, Ser: 1.28 mg/dL — ABNORMAL HIGH (ref 0.44–1.00)
GFR, Estimated: 46 mL/min — ABNORMAL LOW (ref >60)
Glucose, Bld: 104 mg/dL — ABNORMAL HIGH (ref 70–99)
Potassium: 3.8 mmol/L (ref 3.5–5.1)
Sodium: 138 mmol/L (ref 135–145)
Total Bilirubin: 0.5 mg/dL (ref 0.3–1.2)
Total Protein: 6.4 g/dL — ABNORMAL LOW (ref 6.5–8.1)

## 2023-07-09 LAB — GLUCOSE, CAPILLARY
Glucose-Capillary: 98 mg/dL (ref 70–99)
Glucose-Capillary: 100 mg/dL — ABNORMAL HIGH (ref 70–99)

## 2023-07-09 LAB — HIV ANTIBODY (ROUTINE TESTING W REFLEX): HIV Screen 4th Generation wRfx: NONREACTIVE

## 2023-07-09 MED ORDER — BLOOD PRESSURE KIT: PACK | 0 refills | Status: AC

## 2023-07-09 MED ORDER — VITAMIN B-12 1000 MCG PO TABS: 1000.0000 ug | ORAL_TABLET | Freq: Every day | ORAL | 0 refills | Status: AC

## 2023-07-09 NOTE — Progress Notes (Signed)
EEG complete - results pending 

## 2023-07-09 NOTE — Procedures (Signed)
Patient Name: Jennifer Rose  MRN: [id number]  Epilepsy Attending: Charlsie Quest  Referring Physician/Provider: Caryl Pina, MD  Date: 07/08/2023 Duration: 28.23 mins  Patient history: 67yo F with ams getting eeg to evaluate for seizure  Level of alertness: Awake, asleep  AEDs during EEG study: None  Technical aspects: This EEG study was done with scalp electrodes positioned according to the 10-20 International system of electrode placement. Electrical activity was reviewed with band pass filter of 1-70Hz , sensitivity of 7 uV/mm, display speed of 74mm/sec with a 60Hz  notched filter applied as appropriate. EEG data were recorded continuously and digitally stored.  Video monitoring was available and reviewed as appropriate.  Description: The posterior dominant rhythm consists of 8 Hz activity of moderate voltage (25-35 uV) seen predominantly in posterior head regions, symmetric and reactive to eye opening and eye closing. Sleep was characterized by vertex waves, sleep spindles (12 to 14 Hz), maximal frontocentral region.  EEG showed intermittent generalized 3 to 6 Hz theta-delta slowing. Hyperventilation and photic stimulation were not performed.     ABNORMALITY - Intermittent slow, generalized  IMPRESSION: This study is suggestive of mild diffuse encephalopathy. No seizures or epileptiform discharges were seen throughout the recording.  Ata Pecha Annabelle Harman

## 2023-07-09 NOTE — Plan of Care (Signed)
Problem: Education: Goal: Knowledge of General Education information will improve Description: Including pain rating scale, medication(s)/side effects and non-pharmacologic comfort measures Outcome: Progressing   Problem: Health Behavior/Discharge Planning: Goal: Ability to manage health-related needs will improve Outcome: Progressing   Problem: Clinical Measurements: Goal: Ability to maintain clinical measurements within normal limits will improve Outcome: Progressing Goal: Will remain free from infection Outcome: Progressing Goal: Diagnostic test results will improve Outcome: Progressing Goal: Respiratory complications will improve Outcome: Progressing Goal: Cardiovascular complication will be avoided Outcome: Progressing   Problem: Activity: Goal: Risk for activity intolerance will decrease Outcome: Progressing   Problem: Nutrition: Goal: Adequate nutrition will be maintained Outcome: Progressing   Problem: Coping: Goal: Level of anxiety will decrease Outcome: Progressing   Problem: Elimination: Goal: Will not experience complications related to bowel motility Outcome: Progressing Goal: Will not experience complications related to urinary retention Outcome: Progressing   Problem: Pain Managment: Goal: General experience of comfort will improve Outcome: Progressing   Problem: Safety: Goal: Ability to remain free from injury will improve Outcome: Progressing   Problem: Skin Integrity: Goal: Risk for impaired skin integrity will decrease Outcome: Progressing   Problem: Education: Goal: Ability to describe self-care measures that may prevent or decrease complications (Diabetes Survival Skills Education) will improve Outcome: Progressing Goal: Individualized Educational Video(s) Outcome: Progressing   Problem: Coping: Goal: Ability to adjust to condition or change in health will improve Outcome: Progressing   Problem: Fluid Volume: Goal: Ability to  maintain a balanced intake and output will improve Outcome: Progressing   Problem: Metabolic: Goal: Ability to maintain appropriate glucose levels will improve Outcome: Progressing   Problem: Nutritional: Goal: Maintenance of adequate nutrition will improve Outcome: Progressing Goal: Progress toward achieving an optimal weight will improve Outcome: Progressing   Problem: Skin Integrity: Goal: Risk for impaired skin integrity will decrease Outcome: Progressing   Problem: Tissue Perfusion: Goal: Adequacy of tissue perfusion will improve Outcome: Progressing

## 2023-07-09 NOTE — Discharge Summary (Signed)
Physician Discharge Summary   Patient: Jennifer Rose MRN: [id number] DOB: Dec 23, 1955  Admit date:     07/08/2023  Discharge date: 07/09/23  Discharge Physician: Briant Cedar   PCP: Judy Pimple, MD   Recommendations at discharge:   Follow-up with PCP in 1 week Follow-up with rheumatologist   Discharge Diagnoses: Principal Problem:   Encephalopathy Active Problems:   SLE (systemic lupus erythematosus related syndrome) (HCC)   Hypertensive urgency   Non-insulin dependent type 2 diabetes mellitus (HCC)   Essential hypertension   History of memory loss   Chronic idiopathic thrombocytopenia (HCC)   CKD (chronic kidney disease) stage 3, GFR 30-59 ml/min [facility])    Hospital Course: Jennifer Rose is a 67 y.o. female with medical history HTN, Raynaud's disease, SLE and lupus associated retinopathy, Barrett's esophagus on PPI, DM type II, chronic thrombocytopenia, chronic cognitive change and history of short-term memory loss presented to the ED for evaluation of altered mentation X 24 hours. Patient did not remember having called family multiple times.  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  Due to the symptoms, daughter brought patient to the ED.  Case was discussed with neurology who recommended MRI of the brain and EEG, and if no improvement of her mentation, unofficial consult will be done.  While in the ED, as per my colleague, she returned to her baseline was AAO X 4.  Patient admitted for further management.    Today, patient denies any new complaints mentation continues to remain norma/at baseline.  Discussed with patient and daughter extensively, answered all questions.  Patient lives alone, but daughter states about 20 minutes away, advised to keep a close eye on patient, outpatient follow-up with providers.   Assessment and Plan:  Acute metabolic encephalopathy History of short-term memory loss ?? Likely from hypertensive crisis ??  Mild cognitive impairment Currently, patient is alert oriented x 4.  She is able to follow commands and answering to all question appropriately CT head unremarkable, showed sequelae of severe chronic microvascular ischemic change MRI showed no acute intracranial process, somewhat advanced cerebral volume loss for age and moderate to severe chronic small vessel ischemic disease with remote lacunar infarcts EEG unremarkable If another episode happens, may need outpatient neurology follow-up, monitor closely  Vitamin B12 deficiency Last vitamin B12 on 01/2023 showed 276 Oral supplementation   History of systemic lupus erythematous with eye involvement Continue Plaquenil 200 mg twice daily   Hypertensive crisis Essential hypertension BP currently stable Continue home amlodipine 5 mg daily, losartan 50 mg daily   DM type 2 Diet controlled  Chronic thrombocytopenia PCP to monitor and refer pt to Hem/onc   CKD stage 3A Creatinine 1.29 and GFR 46 Renal function at the baseline Renal ultrasound showed symmetric renal atrophy. No hydronephrosis or asymmetry PCP to refer pt to Nephrology    Consultants: None Procedures performed: None Disposition: Home Diet recommendation:  Cardiac and Carb modified diet   DISCHARGE MEDICATION: Allergies as of 07/09/2023       Reactions   Shrimp Extract Shortness Of Breath   Throat swelling,   Guaifenesin Other (See Comments)   hiccups   Mucinex Clear & Cool Day-night    Hiccups         Medication List     STOP taking these medications    clobetasol ointment 0.05 % Commonly known as: TEMOVATE   mycophenolate 500 MG tablet Commonly known as: CELLCEPT       TAKE  these medications    amLODipine 5 MG tablet Commonly known as: NORVASC TAKE ONE TABLET BY MOUTH ONCE A DAY   Blood Pressure Kit Check BP every morning and evening and keep a log   cholecalciferol 1000 units tablet Commonly known as: VITAMIN D Take 2,000 Units by  mouth daily.   cyanocobalamin 1000 MCG tablet Commonly known as: VITAMIN B12 Take 1 tablet (1,000 mcg total) by mouth daily.   fexofenadine 180 MG tablet Commonly known as: ALLEGRA Take 180 mg by mouth daily as needed for allergies or rhinitis.   fluticasone 50 MCG/ACT nasal spray Commonly known as: FLONASE USE 1 TO 2 SPRAYS IN EACH NOSTRIL DAILY What changed:  when to take this reasons to take this   hydrocortisone 2.5 % cream Apply topically 2 (two) times daily as needed (Rash).   hydroxychloroquine 200 MG tablet Commonly known as: PLAQUENIL Take 200 mg by mouth 2 (two) times daily.   latanoprost 0.005 % ophthalmic solution Commonly known as: XALATAN Place 1 drop into both eyes daily.   losartan 50 MG tablet Commonly known as: COZAAR TAKE ONE TABLET BY MOUTH ONCE A DAY   meclizine 12.5 MG tablet Commonly known as: ANTIVERT Take 12.5 mg by mouth 3 (three) times daily as needed.   omeprazole 20 MG capsule Commonly known as: PRILOSEC Take 1 capsule (20 mg total) by mouth 2 (two) times daily before a meal.   potassium chloride 10 MEQ tablet Commonly known as: KLOR-CON Take 2 tablets (20 mEq total) by mouth daily. What changed:  how much to take when to take this reasons to take this   tacrolimus 0.1 % ointment Commonly known as: PROTOPIC Apply topically 2 (two) times daily. Apply to affected areas twice daily for 2 weeks.  Alternate with Clobetasol cream on areas on body        Follow-up Information     Tower, Audrie Gallus, MD. Schedule an appointment as soon as possible for a visit in 1 week(s).   Specialties: Family Medicine, Radiology Contact information: [address] [place] [address] [phone]                Discharge Exam: General: NAD  Cardiovascular: S1, S2 present Respiratory: CTAB Abdomen: Soft, nontender, nondistended, bowel sounds present Musculoskeletal: No bilateral pedal edema noted Skin: Normal Psychiatry: Normal  mood   Condition at discharge: stable  The results of significant diagnostics from this hospitalization (including imaging, microbiology, ancillary and laboratory) are listed below for reference.   Imaging Studies: EEG adult  Result Date: 07-24-2023 Charlsie Quest, MD     07-24-2023  8:00 AM Patient Name: Sybol Paice MRN: [id number] Epilepsy Attending: Charlsie Quest Referring Physician/Provider: Caryl Pina, MD Date: 07/08/2023 Duration: 28.23 mins Patient history: 66yo F with ams getting eeg to evaluate for seizure Level of alertness: Awake, asleep AEDs during EEG study: None Technical aspects: This EEG study was done with scalp electrodes positioned according to the 10-20 International system of electrode placement. Electrical activity was reviewed with band pass filter of 1-70Hz , sensitivity of 7 uV/mm, display speed of 22mm/sec with a 60Hz  notched filter applied as appropriate. EEG data were recorded continuously and digitally stored.  Video monitoring was available and reviewed as appropriate. Description: The posterior dominant rhythm consists of 8 Hz activity of moderate voltage (25-35 uV) seen predominantly in posterior head regions, symmetric and reactive to eye opening and eye closing. Sleep was characterized by vertex waves, sleep spindles (12 to 14  Hz), maximal frontocentral region.  EEG showed intermittent generalized 3 to 6 Hz theta-delta slowing. Hyperventilation and photic stimulation were not performed.   ABNORMALITY - Intermittent slow, generalized IMPRESSION: This study is suggestive of mild diffuse encephalopathy. No seizures or epileptiform discharges were seen throughout the recording. Priyanka Annabelle Harman   US RENAL  Result Date: 07/09/2023 CLINICAL DATA:  Chronic kidney disease EXAM: RENAL / URINARY TRACT ULTRASOUND COMPLETE COMPARISON:  None Available. FINDINGS: Right Kidney: Renal measurements: 9 x 4 x 5 cm = volume: 90 mL. No focal scarring. Echogenicity within normal  limits. No mass or hydronephrosis visualized. Left Kidney: Renal measurements: 8.2 x 4 x 4 cm = volume: 70 mL. No focal scarring. Echogenicity within normal limits. No mass or hydronephrosis visualized. Bladder: Appears normal for degree of bladder distention. IMPRESSION: Symmetric renal atrophy.  No hydronephrosis or asymmetry. Electronically Signed   By: Tiburcio Pea M.D.   On: 07/09/2023 04:01   MR Brain W and Wo Contrast  Result Date: 07/08/2023 CLINICAL DATA:  Memory loss, altered mental status, confusion, history of lupus EXAM: MRI HEAD WITHOUT AND WITH CONTRAST TECHNIQUE: Multiplanar, multiecho pulse sequences of the brain and surrounding structures were obtained without and with intravenous contrast. CONTRAST:  8mL GADAVIST GADOBUTROL 1 MMOL/ML IV SOLN COMPARISON:  No prior MRI available, correlation is made with 07/08/2023 CT head FINDINGS: Brain: No restricted diffusion to suggest acute or subacute infarct. Areas of T2 shine through in the cerebellum. No abnormal parenchymal or meningeal enhancement. No acute hemorrhage, mass, mass effect, or midline shift. No hydrocephalus or extra-axial collection. Normal pituitary and craniocervical junction. Foci of hemosiderin deposition in right temporal lobe, left frontal lobe, and bilateral parietal lobes, favored to be the sequela of prior hypertensive microhemorrhages. Somewhat advanced cerebral volume loss for age. Remote lacunar infarcts in the pons, cerebellar hemispheres, and right thalamus. Confluent T2 hyperintense signal in the periventricular white matter, likely the sequela of moderate to severe chronic small vessel ischemic disease. Vascular: Normal arterial flow voids. Normal arterial and venous enhancement. Skull and upper cervical spine: Normal marrow signal. Sinuses/Orbits: Clear paranasal sinuses. No acute finding in the orbits. Other: The mastoid air cells are well aerated. IMPRESSION: 1. No acute intracranial process. No evidence of acute  or subacute infarct. 2. 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 Electronically Signed   By: Wiliam Ke M.D.   On: 07/08/2023 23:52   CT Head Wo Contrast  Result Date: 07/08/2023 CLINICAL DATA:  Delirium EXAM: CT HEAD WITHOUT CONTRAST TECHNIQUE: Contiguous axial images were obtained from the base of the skull through the vertex without intravenous contrast. RADIATION DOSE REDUCTION: This exam was performed according to the departmental dose-optimization program which includes automated exposure control, adjustment of the mA and/or kV according to patient size and/or use of iterative reconstruction technique. COMPARISON:  CT Head 12/07/21 FINDINGS: Brain: No evidence of acute infarction, hemorrhage, hydrocephalus, extra-axial collection or mass lesion/mass effect. Sequela of severe chronic microvascular ischemic change. Vascular: No hyperdense vessel or unexpected calcification. Skull: Normal. Negative for fracture or focal lesion. Sinuses/Orbits: No acute finding. Other: None. IMPRESSION: No acute intracranial abnormality. Sequela of severe chronic microvascular ischemic change. Electronically Signed   By: Lorenza Cambridge M.D.   On: 07/08/2023 14:14   DG Chest Portable 1 View  Result Date: 07/08/2023 CLINICAL DATA:  Altered mental status. EXAM: PORTABLE CHEST 1 VIEW COMPARISON:  01/31/2022. FINDINGS: Bilateral lung fields are clear. Bilateral costophrenic angles are clear. Normal cardio-mediastinal silhouette. No acute osseous abnormalities. The soft tissues are within normal limits. IMPRESSION: No active disease. Electronically Signed   By: Jules Schick M.D.   On: 07/08/2023 12:48    Microbiology: Results for orders placed or performed in visit on 10/24/20  Novel Coronavirus, NAA ([facility])     Status: None   Collection Time: 10/24/20  3:39 PM    Specimen: Nasopharyngeal(NP) swabs in vial transport medium   Nasopharynge  Screenin  Result Value Ref Range Status   SARS-CoV-2, NAA Not Detected Not Detected Final    Comment: This nucleic acid amplification test was developed and its performance characteristics determined by [employer]. Nucleic acid amplification tests include RT-PCR and TMA. This test has not been FDA cleared or approved. This test has been authorized by FDA under an Emergency Use Authorization (EUA). This test is only authorized for the duration of time the declaration that circumstances exist justifying the authorization of the emergency use of in vitro diagnostic tests for detection of SARS-CoV-2 virus and/or diagnosis of COVID-19 infection under section 564(b)(1) of the Act, 21 U.S.C. 469GEX-5(M) (1), unless the authorization is terminated or revoked sooner. When diagnostic testing is negative, the possibility of a false negative result should be considered in the context of a patient's recent exposures and the presence of clinical signs and symptoms consistent with COVID-19. An individual without symptoms of COVID-19 and who is not shedding SARS-CoV-2 virus wo uld expect to have a negative (not detected) result in this assay.   SARS-COV-2, NAA 2 DAY TAT     Status: None   Collection Time: 10/24/20  3:39 PM   Nasopharynge  Screenin  Result Value Ref Range Status   SARS-CoV-2, NAA 2 DAY TAT Performed  Final    Labs: CBC: Recent Labs  Lab 07/08/23 1114 07/09/23 0414  WBC 4.5 3.8*  NEUTROABS 3.2  --   HGB 13.4 12.4  HCT 41.1 38.2  MCV 80.6 81.1  PLT 80* 74*   Basic Metabolic Panel: Recent Labs  Lab 07/08/23 1114 07/09/23 0414  NA 141 138  K 4.2 3.8  CL 105 106  CO2 27 26  GLUCOSE 100* 104*  BUN 21 16  CREATININE 1.29* 1.28*  CALCIUM 9.6 9.0   Liver Function Tests: Recent Labs  Lab 07/08/23 1114 07/09/23 0414  AST 19 20  ALT 12 13  ALKPHOS 92 81  BILITOT 0.5 0.5  PROT 7.4  6.4*  ALBUMIN 4.0 3.2*   CBG: Recent Labs  Lab 07/08/23 2157 07/09/23 0743 07/09/23 1144  GLUCAP 124* 98 100*    Discharge time spent: less than 30 minutes.  Signed: Briant Cedar, MD [facility] 07/09/2023

## 2023-07-09 NOTE — Progress Notes (Signed)
Nursing Discharge Note   Name: Jennifer Rose MRN: [id number] DOB: [birth year]    Admit Date:ADMITDTTM@   Discharge Date:TODAY@    *** to be discharged home per MD order.  AVS completed. Reviewed with patient and family at bedside. Highlighted copy provided for patient to take home.  Patient/caregiver able to verbalize understanding of discharge instructions. PIV removed. Patient stable upon discharge.       Allergies as of 07/09/2023       Reactions   Shrimp Extract Shortness Of Breath   Throat swelling,   Guaifenesin Other (See Comments)   hiccups   Mucinex Clear & Cool Day-night    Hiccups         Medication List     STOP taking these medications    clobetasol ointment 0.05 % Commonly known as: TEMOVATE   mycophenolate 500 MG tablet Commonly known as: CELLCEPT       TAKE these medications    amLODipine 5 MG tablet Commonly known as: NORVASC TAKE ONE TABLET BY MOUTH ONCE A DAY   Blood Pressure Kit Check BP every morning and evening and keep a log   cholecalciferol 1000 units tablet Commonly known as: VITAMIN D Take 2,000 Units by mouth daily.   cyanocobalamin 1000 MCG tablet Commonly known as: VITAMIN B12 Take 1 tablet (1,000 mcg total) by mouth daily.   fexofenadine 180 MG tablet Commonly known as: ALLEGRA Take 180 mg by mouth daily as needed for allergies or rhinitis.   fluticasone 50 MCG/ACT nasal spray Commonly known as: FLONASE USE 1 TO 2 SPRAYS IN EACH NOSTRIL DAILY What changed:  when to take this reasons to take this   hydrocortisone 2.5 % cream Apply topically 2 (two) times daily as needed (Rash).   hydroxychloroquine 200 MG tablet Commonly known as: PLAQUENIL Take 200 mg by mouth 2 (two) times daily.   latanoprost 0.005 % ophthalmic solution Commonly known as: XALATAN Place 1 drop into both eyes daily.   losartan 50 MG tablet Commonly known as: COZAAR TAKE ONE TABLET BY MOUTH ONCE A DAY   meclizine 12.5 MG  tablet Commonly known as: ANTIVERT Take 12.5 mg by mouth 3 (three) times daily as needed.   omeprazole 20 MG capsule Commonly known as: PRILOSEC Take 1 capsule (20 mg total) by mouth 2 (two) times daily before a meal.   potassium chloride 10 MEQ tablet Commonly known as: KLOR-CON Take 2 tablets (20 mEq total) by mouth daily. What changed:  how much to take when to take this reasons to take this   tacrolimus 0.1 % ointment Commonly known as: PROTOPIC Apply topically 2 (two) times daily. Apply to affected areas twice daily for 2 weeks.  Alternate with Clobetasol cream on areas on body         Discharge Instructions/ Education: Discharge instructions given to patient/family with verbalized understanding. Discharge education completed with patient/family including: follow up instructions, medication list, discharge activities, and limitations if indicated. Additional discharge instructions as indicated by discharging provider also reviewed. Patient and family able to verbalize understanding, all questions fully answered. Patient instructed to return to Emergency Department, call 911, or call MD for any changes in condition.  Patient escorted via wheelchair to lobby and discharged home via private automobile.

## 2023-07-09 NOTE — Plan of Care (Signed)

## 2023-07-10 ENCOUNTER — Telehealth: Payer: Self-pay

## 2023-07-10 NOTE — Transitions of Care (Post Inpatient/ED Visit) (Cosign Needed)
07/10/2023  Name: Jennifer Rose MRN: [id number] DOB: August 31, 1956  Today's TOC FU Call Status: Today's TOC FU Call Status:: Successful TOC FU Call Completed TOC FU Call Complete Date: 07/10/23 Patient's Name and Date of Birth confirmed.  Transition Care Management Follow-up Telephone Call Date of Discharge: 07/09/23 Discharge Facility: [facility]) Type of Discharge: Inpatient Admission Primary Inpatient Discharge Diagnosis:: Encephalopathy How have you been since you were released from the hospital?: Better Any questions or concerns?: No  Items Reviewed: Did you receive and understand the discharge instructions provided?: Yes Medications obtained,verified, and reconciled?: Yes (Medications Reviewed) Any new allergies since your discharge?: No Dietary orders reviewed?: NA Do you have support at home?: Yes People in Home: child(ren), adult  Medications Reviewed Today: Medications Reviewed Today     Reviewed by Anthoney Harada, LPN (Licensed Practical Nurse) on 07/10/23 at 1035  Med List Status: <None>   Medication Order Taking? Sig Documenting Provider Last Dose Status Informant  amLODipine (NORVASC) 5 MG tablet [id number] Yes TAKE ONE TABLET BY MOUTH ONCE A DAY Tower, Audrie Gallus, MD Taking Active Self, Pharmacy Records  Blood Pressure KIT [id number] Yes Check BP every morning and evening and keep a log Briant Cedar, MD Taking Active   cholecalciferol (VITAMIN D) 1000 UNITS tablet [id number] Yes Take 2,000 Units by mouth daily. [provider] Taking Active Self, Pharmacy Records  cyanocobalamin (VITAMIN B12) 1000 MCG tablet [id number] Yes Take 1 tablet (1,000 mcg total) by mouth daily. Briant Cedar, MD Taking Active   fexofenadine [facility]) 180 MG tablet [id number] Yes Take 180 mg by mouth daily as needed for allergies or rhinitis. [provider] Taking Active Self, Pharmacy Records  fluticasone [facility]) 50 MCG/ACT nasal spray [id number] Yes USE 1  TO 2 SPRAYS IN EACH NOSTRIL DAILY  Patient taking differently: Place 1-2 sprays into both nostrils daily as needed for allergies.   Tower, Audrie Gallus, MD Taking Active Self, Pharmacy Records  hydrocortisone 2.5 % cream [id number] Yes Apply topically 2 (two) times daily as needed (Rash). Terri Piedra, DO Taking Active Self, Pharmacy Records  hydroxychloroquine (PLAQUENIL) 200 MG tablet [id number] Yes Take 200 mg by mouth 2 (two) times daily. [provider] Taking Active Self, Pharmacy Records  latanoprost (XALATAN) 0.005 % ophthalmic solution [id number] Yes Place 1 drop into both eyes daily. [provider] Taking Active Self, Pharmacy Records  losartan (COZAAR) 50 MG tablet [id number] Yes TAKE ONE TABLET BY MOUTH ONCE A DAY Tower, Audrie Gallus, MD Taking Active Self, Pharmacy Records  meclizine (ANTIVERT) 12.5 MG tablet [id number] Yes Take 12.5 mg by mouth 3 (three) times daily as needed. [provider] Taking Active Self, Pharmacy Records  omeprazole (PRILOSEC) 20 MG capsule [id number] Yes Take 1 capsule (20 mg total) by mouth 2 (two) times daily before a meal. Jenel Lucks, MD Taking Active Self, Pharmacy Records  potassium chloride (KLOR-CON) 10 MEQ tablet [id number] Yes Take 2 tablets (20 mEq total) by mouth daily.  Patient taking differently: Take 10 mEq by mouth daily as needed (Low potassium).   Tower, Audrie Gallus, MD Taking Active Self, Pharmacy Records  tacrolimus (PROTOPIC) 0.1 % ointment [id number] Yes Apply topically 2 (two) times daily. Apply to affected areas twice daily for 2 weeks.  Alternate with Clobetasol cream on areas on body Terri Piedra, DO Taking Active Self, Pharmacy Records            Home Care and Equipment/Supplies: Were Home Health Services Ordered?: No Any  new equipment or medical supplies ordered?: No  Functional Questionnaire: Do you need assistance with bathing/showering or dressing?: No Do you need assistance with meal preparation?:  No Do you need assistance with eating?: No Do you have difficulty maintaining continence: No Do you need assistance with getting out of bed/getting out of a chair/moving?: No Do you have difficulty managing or taking your medications?: No  Follow up appointments reviewed: PCP Follow-up appointment confirmed?: Yes Date of PCP follow-up appointment?: 07/18/23 Follow-up Provider: [facility] Follow-up appointment confirmed?: No Reason Specialist Follow-Up Not Confirmed: Patient has Specialist Provider Number and will Call for Appointment Do you need transportation to your follow-up appointment?: No Do you understand care options if your condition(s) worsen?: Yes-patient verbalized understanding    SIGNATURE Kandis Fantasia, LPN [facility] Health Advisor [place] [facility] You Are. We Are. One [facility] Direct Dial [phone]

## 2023-07-18 ENCOUNTER — Encounter: Payer: Self-pay | Admitting: Family Medicine

## 2023-07-18 ENCOUNTER — Ambulatory Visit: Payer: Medicare PPO | Admitting: Family Medicine

## 2023-07-18 VITALS — BP 118/84 | HR 75 | Wt 191.8 lb

## 2023-07-18 DIAGNOSIS — I160 Hypertensive urgency: Secondary | ICD-10-CM | POA: Diagnosis not present

## 2023-07-18 DIAGNOSIS — N1831 Chronic kidney disease, stage 3a: Secondary | ICD-10-CM | POA: Diagnosis not present

## 2023-07-18 DIAGNOSIS — R4189 Other symptoms and signs involving cognitive functions and awareness: Secondary | ICD-10-CM

## 2023-07-18 DIAGNOSIS — D696 Thrombocytopenia, unspecified: Secondary | ICD-10-CM

## 2023-07-18 DIAGNOSIS — I10 Essential (primary) hypertension: Secondary | ICD-10-CM

## 2023-07-18 DIAGNOSIS — Z8669 Personal history of other diseases of the nervous system and sense organs: Secondary | ICD-10-CM | POA: Diagnosis not present

## 2023-07-18 DIAGNOSIS — D693 Immune thrombocytopenic purpura: Secondary | ICD-10-CM

## 2023-07-18 DIAGNOSIS — Z23 Encounter for immunization: Secondary | ICD-10-CM | POA: Diagnosis not present

## 2023-07-18 DIAGNOSIS — R7303 Prediabetes: Secondary | ICD-10-CM | POA: Diagnosis not present

## 2023-07-18 DIAGNOSIS — R0989 Other specified symptoms and signs involving the circulatory and respiratory systems: Secondary | ICD-10-CM

## 2023-07-18 DIAGNOSIS — R413 Other amnesia: Secondary | ICD-10-CM

## 2023-07-18 LAB — CBC WITH DIFFERENTIAL/PLATELET
Basophils Absolute: 0 10*3/uL (ref 0.0–0.1)
Basophils Relative: 0.5 % (ref 0.0–3.0)
Eosinophils Absolute: 0.1 10*3/uL (ref 0.0–0.7)
Eosinophils Relative: 2 % (ref 0.0–5.0)
HCT: 39.6 % (ref 36.0–46.0)
Hemoglobin: 12.7 g/dL (ref 12.0–15.0)
Lymphocytes Relative: 10.1 % — ABNORMAL LOW (ref 12.0–46.0)
Lymphs Abs: 0.6 10*3/uL — ABNORMAL LOW (ref 0.7–4.0)
MCHC: 32 g/dL (ref 30.0–36.0)
MCV: 80.6 fl (ref 78.0–100.0)
Monocytes Absolute: 0.5 10*3/uL (ref 0.1–1.0)
Monocytes Relative: 9.5 % (ref 3.0–12.0)
Neutro Abs: 4.3 10*3/uL (ref 1.4–7.7)
Neutrophils Relative %: 77.9 % — ABNORMAL HIGH (ref 43.0–77.0)
Platelets: 97 10*3/uL — ABNORMAL LOW (ref 150.0–400.0)
RBC: 4.91 Mil/uL (ref 3.87–5.11)
RDW: 13.8 % (ref 11.5–15.5)
WBC: 5.5 10*3/uL (ref 4.0–10.5)

## 2023-07-18 LAB — BASIC METABOLIC PANEL
BUN: 20 mg/dL (ref 6–23)
CO2: 24 mEq/L (ref 19–32)
Calcium: 9.6 mg/dL (ref 8.4–10.5)
Chloride: 108 mEq/L (ref 96–112)
Creatinine, Ser: 1.27 mg/dL — ABNORMAL HIGH (ref 0.40–1.20)
GFR: 44.44 mL/min — ABNORMAL LOW (ref >60.00)
Glucose, Bld: 89 mg/dL (ref 70–99)
Potassium: 4.1 mEq/L (ref 3.5–5.1)
Sodium: 140 mEq/L (ref 135–145)

## 2023-07-18 LAB — HEMOGLOBIN A1C: Hgb A1c MFr Bld: 5.9 % (ref 4.6–6.5)

## 2023-07-18 NOTE — Assessment & Plan Note (Addendum)
Pt had episode of hypertensive crisus (systolic) in conj with encephalopathy  Reviewed hospital records, lab results and studies in detail  Reassuring work up  Blood pressure at home are improved  Even better in office today  BP: 118/84   Continues Losartan 50 mg daily  Amlodipine 5 mg daily   Family is managing meds now (? Better compliance)   Bmet today  Given handout re: DASH eating as well

## 2023-07-18 NOTE — Patient Instructions (Addendum)
I put the referral in for neurology   Please let us know if you don't hear in 1-2 weeks    Let's see how the labs look regarding kidney function before considering nephrology referral    Try to get most of your carbohydrates from produce (with the exception of white potatoes) and whole grains Eat less bread/pasta/rice/snack foods/cereals/sweets and other items from the middle of the grocery store (processed carbs)    Lab today for chemistries and cbc (including platelets)   Keep monitoring blood pressure

## 2023-07-18 NOTE — Assessment & Plan Note (Addendum)
In setting of SLE and HTN  Did have hypertensive crisis and encephalopathy episode  Reviewed hospital records, lab results and studies in detail   Last GFR of 46   Re check today  Consider ref to nephrology   Addendum GFR 44.4  Ref made to nephrology

## 2023-07-18 NOTE — Assessment & Plan Note (Signed)
?   Acute on chronic  Had episode of acute encephalopathy and was obs in hosp Reviewed hospital records, lab results and studies in detail    MRI did show some old infarcts Will ref to neuro

## 2023-07-18 NOTE — Assessment & Plan Note (Signed)
Acute on chronic Last MMSE was good but then had episode of encephalopathy   Ref done to neuro

## 2023-07-18 NOTE — Assessment & Plan Note (Signed)
Plt was 76 at hosp  Reviewed hospital records, lab results and studies in detail    May be due to SLE Re check today  No bleeding or bruising   Consider heme ref

## 2023-07-18 NOTE — Assessment & Plan Note (Signed)
In hospital in conj with encephalopathy  Reassuring w//u  Resolved  Renal US noted some atrophy  Has SLE   Will continue to watch for recurrence Family has blood pressure cuff now

## 2023-07-18 NOTE — Assessment & Plan Note (Signed)
Brief episode of confusion  Does not seem to be amnestic  This followed a very stressful even  Also hypertensive crisis   Reviewed hospital records, lab results and studies in detail   Work up was reassuring  MRI noted some old brain infarcts  EEG consistend with encephalopathy   Referral done to neuro  Is back to baseline today  Family has taken over her med management

## 2023-07-18 NOTE — Progress Notes (Addendum)
Subjective:    Patient ID: Jennifer Rose, female    DOB: 07-04-1956, 67 y.o.   MRN: [id number]  HPI  Wt Readings from Last 3 Encounters:  07/18/23 191 lb 12.8 oz (87 kg)  05/13/23 191 lb 6.4 oz (86.8 kg)  04/25/23 181 lb (82.1 kg)   31.92 kg/m  Vitals:   07/18/23 0850  BP: 118/84  Pulse: 75  SpO2: 100%   Pt presents for hospital follow up   Pt went to ED for confusion / memory deficit  (encephalopathy)  Presented with altered mentation for 24 hours  Also possible hypertensive crisis  Kept overnight and observed   Work up was normal    MRI brain  IMPRESSION: 1. No acute intracranial process. No evidence of acute or subacute infarct. 2. 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  EEG   IMPRESSION: This study is suggestive of mild diffuse encephalopathy. No seizures or epileptiform discharges were seen throughout the recording. Priyanka Annabelle Harman   US renal  IMPRESSION: Symmetric renal atrophy. No hydronephrosis or asymmetry.   CT head IMPRESSION: No acute intracranial abnormality. Sequela of severe chronic microvascular ischemic change.     Noted low normal B12 level from April Lab Results  Component Value Date   VITAMINB12 276 02/18/2023   Started on B12   Per d/c summary  Acute metabolic encephalopathy History of short-term memory loss ?? Likely from hypertensive crisis ?? Mild cognitive impairment Currently, patient is alert oriented x 4.  She is able to follow commands and answering to all question appropriately CT head unremarkable, showed sequelae of severe chronic microvascular ischemic change MRI showed no acute intracranial process, somewhat advanced cerebral volume loss for age and moderate to severe chronic small vessel ischemic disease with remote lacunar infarcts EEG unremarkable If  another episode happens, may need outpatient neurology follow-up, monitor closely   Vitamin B12 deficiency Last vitamin B12 on 01/2023 showed 276 Oral supplementation   History of systemic lupus erythematous with eye involvement Continue Plaquenil 200 mg twice daily   Hypertensive crisis Essential hypertension BP currently stable Continue home amlodipine 5 mg daily, losartan 50 mg daily   DM type 2 Diet controlled (Actually pre diabetes)    Chronic thrombocytopenia PCP to monitor and refer pt to Hem/onc  Lab Results  Component Value Date   WBC 5.5 07/18/2023   HGB 12.7 07/18/2023   HCT 39.6 07/18/2023   MCV 80.6 07/18/2023   PLT 97.0 (L) 07/18/2023      CKD stage 3A Creatinine 1.29 and GFR 46 Renal function at the baseline Renal ultrasound showed symmetric renal atrophy. No hydronephrosis or asymmetry PCP to refer pt to Nephrology  HTN bp is stable today  No cp or palpitations or headaches or edema  No side effects to medicines  BP Readings from Last 3 Encounters:  07/18/23 118/84  07/09/23 (!) 143/89  05/13/23 138/80    Amlodipine 5 mg daily  Losartan 50 mg daily    Lab Results  Component Value Date   NA 140 07/18/2023   K 4.1 07/18/2023   CO2 24 07/18/2023   GLUCOSE 89 07/18/2023   BUN 20 07/18/2023   CREATININE 1.27 (H) 07/18/2023   CALCIUM 9.6 07/18/2023   GFR 44.44 (L) 07/18/2023   GFRNONAA 46 (L) 07/09/2023   Lab Results  Component Value Date   ALT 13 07/09/2023  AST 20 07/09/2023   ALKPHOS 81 07/09/2023   BILITOT 0.5 07/09/2023    Lab Results  Component Value Date   HGBA1C 5.9 07/18/2023   Lab Results  Component Value Date   TSH 2.14 02/18/2023    Daughter is here with her today  Family is managing her medications   Family notes that she was not quite herself for several days  Was tired and weak initially   Stress- noted daughter was in [facility] end of [place] (not hurt but car was totalled)  Pt was trying to help her / issue with  transportation/ tow truck  She got lost in the processed  Stressful funeral the week before   Checking blood pressure at  home  Checking it regularly  140s on top   In retrospect she may have missed some medicine before blood pressure went up     Patient Active Problem List   Diagnosis Date Noted   Labile blood pressure 07/18/2023   History of encephalopathy 07/18/2023   Hypertensive urgency 07/08/2023   History of memory loss 07/08/2023   Chronic idiopathic thrombocytopenia (HCC) 07/08/2023   CKD (chronic kidney disease) stage 3, GFR 30-59 ml/min (HCC) 07/08/2023   Contact dermatitis 05/13/2023   Rash of neck 03/29/2023   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) 02/18/2023   Short-term memory loss 02/18/2023   Cognitive change 02/18/2023   Peptic duodenitis 09/17/2022   Barrett's esophagus 09/17/2022   LPRD (laryngopharyngeal reflux disease) 07/03/2022   Thornwaldt's cyst 07/03/2022   Decreased GFR 04/19/2022   Hiccups 01/30/2022   Colon cancer screening 11/12/2019   H/O migraine 08/26/2018   Routine general medical examination at a health care facility 08/26/2018   Hypokalemia 08/26/2018   Thrombocytopenia (HCC) 04/30/2017   Prediabetes 12/13/2014   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 36.9 in adult 12/13/2014   GLAUCOMA 04/15/2009   SLE (systemic lupus erythematosus related syndrome) (HCC) 11/09/2008   Allergic rhinitis 09/24/2007   Essential hypertension 03/22/2004   Past Medical History:  Diagnosis Date   Allergy    allergic rhinitis   Eye pressure    GERD (gastroesophageal reflux disease)    Hypertension 03/22/2004   Lupus (HCC)    Uterine fibroid 08/22/2000   per GYN   Past Surgical History:  Procedure Laterality Date   BREAST CYST EXCISION Right 36 yrs ago   x2   CESAREAN SECTION     x 3   CHOLECYSTECTOMY      ENDOMETRIAL ABLATION  01/2002   polyps?DUB   Social History   Tobacco Use   Smoking status: Never   Smokeless tobacco: Never  Vaping Use   Vaping status: Never Used  Substance Use Topics   Alcohol use: No    Alcohol/week: 0.0 standard drinks of alcohol   Drug use: No   Family History  Problem Relation Age of Onset   Blindness Mother    Stroke Mother    Diabetes Mother    Glaucoma Mother    Hypertension Mother    Glaucoma Father    Diabetes Father    Hypertension Father    Prostate cancer Father    Stroke Sister    Diabetes Sister    Glaucoma Maternal Grandmother    Diabetes Maternal Grandmother    Hypertension Maternal Grandmother    Colon cancer Maternal Grandmother    Heart attack Maternal Grandfather  Glaucoma Paternal Grandmother    Heart disease Paternal Grandmother    Colitis Daughter    Hypertension Daughter    Diabetes Daughter    Allergies  Allergen Reactions   Shrimp Extract Shortness Of Breath    Throat swelling,   Guaifenesin Other (See Comments)    hiccups   Mucinex Clear & Cool Day-Night     Hiccups    Current Outpatient Medications on File Prior to Visit  Medication Sig Dispense Refill   amLODipine (NORVASC) 5 MG tablet TAKE ONE TABLET BY MOUTH ONCE A DAY 90 tablet 1   Blood Pressure KIT Check BP every morning and evening and keep a log 1 kit 0   cholecalciferol (VITAMIN D) 1000 UNITS tablet Take 2,000 Units by mouth daily.     cyanocobalamin (VITAMIN B12) 1000 MCG tablet Take 1 tablet (1,000 mcg total) by mouth daily. 30 tablet 0   fexofenadine (ALLEGRA) 180 MG tablet Take 180 mg by mouth daily as needed for allergies or rhinitis.     fluticasone (FLONASE) 50 MCG/ACT nasal spray USE 1 TO 2 SPRAYS IN EACH NOSTRIL DAILY (Patient taking differently: Place 1-2 sprays into both nostrils daily as needed for allergies.) 16 g 5   hydrocortisone 2.5 % cream Apply topically 2 (two) times daily as needed (Rash). 90 g 3   hydroxychloroquine (PLAQUENIL)  200 MG tablet Take 200 mg by mouth 2 (two) times daily.     latanoprost (XALATAN) 0.005 % ophthalmic solution Place 1 drop into both eyes daily.     losartan (COZAAR) 50 MG tablet TAKE ONE TABLET BY MOUTH ONCE A DAY 90 tablet 0   meclizine (ANTIVERT) 12.5 MG tablet Take 12.5 mg by mouth 3 (three) times daily as needed.     omeprazole (PRILOSEC) 20 MG capsule Take 1 capsule (20 mg total) by mouth 2 (two) times daily before a meal. 90 capsule 3   potassium chloride (KLOR-CON) 10 MEQ tablet Take 2 tablets (20 mEq total) by mouth daily. (Patient taking differently: Take 10 mEq by mouth daily as needed (Low potassium).) 180 tablet 3   tacrolimus (PROTOPIC) 0.1 % ointment Apply topically 2 (two) times daily. Apply to affected areas twice daily for 2 weeks.  Alternate with Clobetasol cream on areas on body 100 g 2   No current facility-administered medications on file prior to visit.    Review of Systems  Constitutional:  Positive for fatigue. Negative for activity change, appetite change, fever and unexpected weight change.  HENT:  Negative for congestion, ear pain, rhinorrhea, sinus pressure and sore throat.   Eyes:  Negative for pain, redness and visual disturbance.  Respiratory:  Negative for cough, shortness of breath and wheezing.   Cardiovascular:  Negative for chest pain and palpitations.  Gastrointestinal:  Negative for abdominal pain, blood in stool, constipation and diarrhea.  Endocrine: Negative for polydipsia and polyuria.  Genitourinary:  Negative for dysuria, frequency and urgency.  Musculoskeletal:  Negative for arthralgias, back pain and myalgias.  Skin:  Negative for pallor and rash.  Allergic/Immunologic: Negative for environmental allergies.  Neurological:  Negative for dizziness, syncope and headaches.  Hematological:  Negative for adenopathy. Does not bruise/bleed easily.  Psychiatric/Behavioral:  Negative for decreased concentration and dysphoric mood. The patient is not  nervous/anxious.        Feels back to baseline   Was upset during a funeral but better now        Objective:   Physical Exam Constitutional:  General: She is not in acute distress.    Appearance: Normal appearance. She is well-developed. She is obese. She is not ill-appearing or diaphoretic.  HENT:     Head: Normocephalic and atraumatic.     Right Ear: External ear normal.     Left Ear: External ear normal.     Nose: Nose normal.     Mouth/Throat:     Pharynx: No oropharyngeal exudate.  Eyes:     General: No scleral icterus.       Right eye: No discharge.        Left eye: No discharge.     Conjunctiva/sclera: Conjunctivae normal.     Pupils: Pupils are equal, round, and reactive to light.     Comments: No nystagmus  Neck:     Thyroid: No thyromegaly.     Vascular: No carotid bruit or JVD.     Trachea: No tracheal deviation.  Cardiovascular:     Rate and Rhythm: Normal rate and regular rhythm.     Heart sounds: Normal heart sounds. No murmur heard. Pulmonary:     Effort: Pulmonary effort is normal. No respiratory distress.     Breath sounds: Normal breath sounds. No wheezing or rales.  Abdominal:     General: Bowel sounds are normal. There is no distension.     Palpations: Abdomen is soft. There is no mass.     Tenderness: There is no abdominal tenderness.  Musculoskeletal:        General: No tenderness.     Cervical back: Full passive range of motion without pain, normal range of motion and neck supple.  Lymphadenopathy:     Cervical: No cervical adenopathy.  Skin:    General: Skin is warm and dry.     Coloration: Skin is not pale.     Findings: No rash.  Neurological:     Mental Status: She is alert and oriented to person, place, and time.     Cranial Nerves: No cranial nerve deficit, dysarthria or facial asymmetry.     Sensory: Sensation is intact. No sensory deficit.     Motor: No tremor, atrophy, abnormal muscle tone, seizure activity or pronator drift.      Coordination: Romberg sign negative. Coordination normal. Finger-Nose-Finger Test normal.     Gait: Gait normal.     Deep Tendon Reflexes: Reflexes are normal and symmetric. Reflexes normal.     Comments: No focal cerebellar signs   Psychiatric:        Behavior: Behavior normal.        Thought Content: Thought content normal.           Assessment & Plan:   Problem List Items Addressed This Visit       Cardiovascular and Mediastinum   Essential hypertension    Pt had episode of hypertensive crisus (systolic) in conj with encephalopathy  Reviewed hospital records, lab results and studies in detail  Reassuring work up  Blood pressure at home are improved  Even better in office today  BP: 118/84   Continues Losartan 50 mg daily  Amlodipine 5 mg daily   Family is managing meds now (? Better compliance)   Bmet today  Given handout re: DASH eating as well      Relevant Orders   Basic metabolic panel (Completed)   CBC with Differential/Platelet (Completed)   Ambulatory referral to Nephrology   Hypertensive urgency    In hospital in conj with encephalopathy  Reassuring w//u  Resolved  Renal US noted some atrophy  Has SLE   Will continue to watch for recurrence Family has blood pressure cuff now        Relevant Orders   Ambulatory referral to Nephrology     Musculoskeletal and Integument   Chronic idiopathic thrombocytopenia (HCC)    Suspect due to SLE Went lower during encephalopathy episode with PLT of 74  May need hematology  Re check today   Plan to update rheumatologist         Genitourinary   CKD (chronic kidney disease) stage 3, GFR 30-59 ml/min (HCC)    In setting of SLE and HTN  Did have hypertensive crisis and encephalopathy episode  Reviewed hospital records, lab results and studies in detail   Last GFR of 46   Re check today  Consider ref to nephrology   Addendum GFR 44.4  Ref made to nephrology      Relevant Orders   Ambulatory  referral to Nephrology     Hematopoietic and Hemostatic   Thrombocytopenia (HCC)    Plt was 74 at hosp  Reviewed hospital records, lab results and studies in detail    May be due to SLE Re check today  No bleeding or bruising   Consider heme ref       Relevant Orders   CBC with Differential/Platelet (Completed)     Other   Cognitive change    ? Acute on chronic  Had episode of acute encephalopathy and was obs in hosp Reviewed hospital records, lab results and studies in detail    MRI did show some old infarcts Will ref to neuro      Relevant Orders   Ambulatory referral to Neurology   History of encephalopathy - Primary    Brief episode of confusion  Does not seem to be amnestic  This followed a very stressful even  Also hypertensive crisis   Reviewed hospital records, lab results and studies in detail   Work up was reassuring  MRI noted some old brain infarcts  EEG consistend with encephalopathy   Referral done to neuro  Is back to baseline today  Family has taken over her med management        Relevant Orders   Ambulatory referral to Neurology   Labile blood pressure    Unsure what caused hypertensive crisis  Stress is in differential  Has SLE and CKD Also per family possible med non compliance (they have taken over med management now)  Renal US shoed atrophy  Is back to normal   Lab today  Considering nephrology referral  Did ref to neuro       Prediabetes    Lab Results  Component Value Date   HGBA1C 5.7 02/18/2023    Re checking today Family is helping her eat better       Relevant Orders   Hemoglobin A1c (Completed)   Ambulatory referral to Nephrology   Short-term memory loss    Acute on chronic Last MMSE was good but then had episode of encephalopathy   Ref done to neuro      Relevant Orders   Ambulatory referral to Neurology   Other Visit Diagnoses     Need for influenza vaccination       Relevant Orders   Flu Vaccine  Trivalent High Dose (Fluad) (Completed)

## 2023-07-18 NOTE — Assessment & Plan Note (Signed)
Suspect due to SLE Went lower during encephalopathy episode with PLT of 74  May need hematology  Re check today   Plan to update rheumatologist

## 2023-07-18 NOTE — Assessment & Plan Note (Signed)
Unsure what caused hypertensive crisis  Stress is in differential  Has SLE and CKD Also per family possible med non compliance (they have taken over med management now)  Renal US shoed atrophy  Is back to normal   Lab today  Considering nephrology referral  Did ref to neuro

## 2023-07-18 NOTE — Assessment & Plan Note (Signed)
Lab Results  Component Value Date   HGBA1C 5.7 02/18/2023    Re checking today Family is helping her eat better

## 2023-07-19 DIAGNOSIS — H40023 Open angle with borderline findings, high risk, bilateral: Secondary | ICD-10-CM | POA: Diagnosis not present

## 2023-07-19 DIAGNOSIS — H4710 Unspecified papilledema: Secondary | ICD-10-CM | POA: Diagnosis not present

## 2023-07-19 NOTE — Addendum Note (Signed)
Addended by: Roxy Manns A on: 07/19/2023 01:37 PM   Modules accepted: Orders

## 2023-07-25 ENCOUNTER — Ambulatory Visit: Payer: Medicare PPO | Admitting: Neurology

## 2023-07-25 ENCOUNTER — Encounter: Payer: Self-pay | Admitting: Neurology

## 2023-07-25 ENCOUNTER — Telehealth: Payer: Self-pay | Admitting: Neurology

## 2023-07-25 VITALS — BP 154/93 | HR 73 | Ht 66.0 in | Wt 193.0 lb

## 2023-07-25 DIAGNOSIS — R410 Disorientation, unspecified: Secondary | ICD-10-CM

## 2023-07-25 DIAGNOSIS — I679 Cerebrovascular disease, unspecified: Secondary | ICD-10-CM

## 2023-07-25 DIAGNOSIS — G3184 Mild cognitive impairment, so stated: Secondary | ICD-10-CM

## 2023-07-25 DIAGNOSIS — G43709 Chronic migraine without aura, not intractable, without status migrainosus: Secondary | ICD-10-CM | POA: Insufficient documentation

## 2023-07-25 MED ORDER — UBRELVY 50 MG PO TABS: ORAL_TABLET | ORAL | 11 refills | Status: DC

## 2023-07-25 NOTE — Progress Notes (Signed)
Chief Complaint  Patient presents with   New Patient (Initial Visit)    Rm14, daughter present, NP Urgent Internal referral for chronic cognitive changes and recent hospitalization for encephalopathy: moca 24/30. No episodes since home from hospital.       ASSESSMENT AND PLAN  Jennifer Rose is a 67 y.o. female   Cognitive impairment meant  MoCA examination 24/30, missed 4 out of 5 delayed recall, her increased confusion in the challenge scenario most reflect her underlying cognitive impairment made worse by new challenge,   Cerebral vascular disease  Complete evaluation with echocardiogram, ultrasound of carotid artery  Chronic migraine headaches  Ubrelvy as needed  DIAGNOSTIC DATA (LABS, IMAGING, TESTING) - I reviewed patient records, labs, notes, testing and imaging myself where available.   MEDICAL HISTORY:  Jennifer Rose is a 67 year old female, seen in request by her primary care doctor Tower, [place] for evaluation of cognitive change, initial evaluation was with her daughter July 25, 2023   History is obtained from the patient and review of electronic medical records. I personally reviewed pertinent available imaging films in PACS.   PMHx of  HTN Lupus more than 30 years, joint pain, skin rashes, muscle ache, optic nerve, Dr. 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  Hospital admission in September 2024 for increased confusion for 24 hours, it was a day that interrupted her routine, she has to get up early to help her daughter, she drove her daughter to the bank, then supposed to drive back to her daughter's house to meet somebody, on her way driving back, she called her daughter multiple times, asking what she is supposed to do,  Even when  her daughter meet her few hours later, she was noticed to have brain fog, repeating herself, confused,  MRI of the brain with without contrast July 08, 2023 showed no acute process, advanced volume loss for age, moderate to severe chronic small vessel disease, lacunar 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 Alben Deeds at [facility] rheumatology, for lupus, seen by Dr. Candise Che May 2024 for thrombocytopenia, pathology, platelet clumps, thought that systemic lupus can cause platelet clumping outside the body, consistent with pseudothrombocytopenia, may also have an immune component with her history of lupus, may produce antibodies against platelet,  Ophthalmology evaluation by Dr.Zamora in March 2023, for optic disc edema, OD, obscuration of disc vessels with hemorrhage, visual acuity remain 20/20, CT of the brain and orbit with without contrast showed no retrobulbar pathology, hyperfluorescence staining and leakage of disc OD, consistent with lupus associated papillitis OD, she was treated with prednisone 40mg  tapering down slowly, 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  PHYSICAL  EXAMNIATION:  Gen: NAD, conversant, well nourised, well groomed                     Cardiovascular: Regular rate rhythm, no peripheral edema, warm, nontender. Eyes: Conjunctivae clear without exudates or hemorrhage Neck: Supple,  no carotid bruits. Pulmonary: Clear to auscultation bilaterally   NEUROLOGICAL EXAM:  MENTAL STATUS: Speech/cognition: Awake, alert, oriented to history taking and casual conversation    07/25/2023    8:07 AM  Montreal Cognitive Assessment   Visuospatial/ Executive (0/5) 4  Naming (0/3) 3  Attention: Read list of digits (0/2) 2  Attention: Read list of letters (0/1) 1  Attention: Serial 7 subtraction starting at 100 (0/3) 3  Language: Repeat phrase (0/2) 2  Language : Fluency (0/1) 0  Abstraction (0/2) 2  Delayed Recall (0/5) 1  Orientation (0/6) 6  Total 24    CRANIAL NERVES: CN II: Visual fields are full to confrontation. Pupils are round equal and briskly reactive to light. CN III, IV, VI: extraocular movement are normal. No ptosis. CN V: Facial sensation is intact to light touch CN VII: Face is symmetric with normal eye closure  CN VIII: Hearing is normal to causal conversation. CN IX, X: Phonation is normal. CN XI: Head turning and shoulder shrug are intact  MOTOR: There is no pronator drift of out-stretched arms. Muscle bulk and tone are normal. Muscle strength is normal.  REFLEXES: Reflexes are 2+ and symmetric at the biceps, triceps, knees, and ankles. Plantar responses are flexor.  SENSORY: Intact to light touch, pinprick and vibratory sensation are intact in fingers and toes.  COORDINATION: There is no trunk or limb dysmetria noted.  GAIT/STANCE: Posture is normal. Gait is steady with normal steps, base, arm swing, and turning. Heel and toe walking are normal. Tandem gait is normal.  Romberg is absent.  REVIEW OF SYSTEMS:  Full 14 system review of systems performed and notable only for as above All other review of systems were negative.   ALLERGIES: Allergies  Allergen Reactions   Shrimp Extract Shortness Of Breath    Throat swelling,   Guaifenesin Other (See Comments)    hiccups   Mucinex Clear & Cool Day-Night     Hiccups     HOME  MEDICATIONS: Current Outpatient Medications  Medication Sig Dispense Refill   amLODipine (NORVASC) 5 MG tablet TAKE ONE TABLET BY MOUTH ONCE A DAY 90 tablet 1   Blood Pressure KIT Check BP every morning and evening and keep a log 1 kit 0   cholecalciferol (VITAMIN D) 1000 UNITS tablet Take 2,000 Units by mouth daily.     cyanocobalamin (VITAMIN B12) 1000 MCG tablet Take 1 tablet (1,000 mcg total) by mouth daily. 30 tablet 0   fexofenadine (ALLEGRA) 180 MG tablet Take 180 mg by mouth daily as needed for allergies or rhinitis.     fluticasone (FLONASE) 50 MCG/ACT nasal spray USE 1 TO 2 SPRAYS IN EACH NOSTRIL DAILY (Patient taking differently: Place 1-2 sprays into both nostrils daily as needed for allergies.) 16 g 5   hydrocortisone 2.5 % cream Apply topically 2 (two) times daily as needed (Rash). 90 g 3   hydroxychloroquine (PLAQUENIL) 200 MG tablet Take 200 mg by mouth 2 (two) times daily.     latanoprost (XALATAN) 0.005 % ophthalmic solution Place 1 drop into both eyes daily.     losartan (COZAAR) 50 MG tablet TAKE ONE TABLET BY MOUTH ONCE A DAY 90 tablet 0   meclizine (  ANTIVERT) 12.5 MG tablet Take 12.5 mg by mouth 3 (three) times daily as needed.     omeprazole (PRILOSEC) 20 MG capsule Take 1 capsule (20 mg total) by mouth 2 (two) times daily before a meal. 90 capsule 3   potassium chloride (KLOR-CON) 10 MEQ tablet Take 2 tablets (20 mEq total) by mouth daily. (Patient taking differently: Take 10 mEq by mouth daily as needed (Low potassium).) 180 tablet 3   tacrolimus (PROTOPIC) 0.1 % ointment Apply topically 2 (two) times daily. Apply to affected areas twice daily for 2 weeks.  Alternate with Clobetasol cream on areas on body 100 g 2   No current facility-administered medications for this visit.    PAST MEDICAL HISTORY: Past Medical History:  Diagnosis Date   Allergy    allergic rhinitis   Eye pressure    GERD (gastroesophageal reflux disease)    Hypertension 03/22/2004   Lupus     Uterine fibroid 08/22/2000   per GYN    PAST SURGICAL HISTORY: Past Surgical History:  Procedure Laterality Date   BREAST CYST EXCISION Right 36 yrs ago   x2   CESAREAN SECTION     x 3   CHOLECYSTECTOMY     ENDOMETRIAL ABLATION  01/2002   polyps?DUB    FAMILY HISTORY: Family History  Problem Relation Age of Onset   Blindness Mother    Stroke Mother    Diabetes Mother    Glaucoma Mother    Hypertension Mother    Glaucoma Father    Diabetes Father    Hypertension Father    Prostate cancer Father    Stroke Sister    Diabetes Sister    Glaucoma Maternal Grandmother    Diabetes Maternal Grandmother    Hypertension Maternal Grandmother    Colon cancer Maternal Grandmother    Heart attack Maternal Grandfather    Glaucoma Paternal Grandmother    Heart disease Paternal Grandmother    Colitis Daughter    Hypertension Daughter    Diabetes Daughter     SOCIAL HISTORY: Social History   Socioeconomic History   Marital status: Divorced    Spouse name: Not on file   Number of children: 3   Years of education: Not on file   Highest education level: Not on file  Occupational History   Occupation: retired Medical laboratory scientific officer  Tobacco Use   Smoking status: Never   Smokeless tobacco: Never  Vaping Use   Vaping status: Never Used  Substance and Sexual Activity   Alcohol use: No    Alcohol/week: 0.0 standard drinks of alcohol   Drug use: No   Sexual activity: Not on file  Other Topics Concern   Not on file  Social History Narrative   Not on file   Social Determinants of Health   Financial Resource Strain: Not on file  Food Insecurity: No Food Insecurity (07/09/2023)   Hunger Vital Sign    Worried About Running Out of Food in the Last Year: Never true    Ran Out of Food in the Last Year: Never true  Transportation Needs: No Transportation Needs (07/09/2023)   PRAPARE - Administrator, Civil Service (Medical): No    Lack of Transportation (Non-Medical): No   Physical Activity: Not on file  Stress: Not on file  Social Connections: Unknown (03/06/2022)   [facility]   Social Network    Social Network: Not on file  Intimate Partner Violence: Not At Risk (07/09/2023)  Humiliation, Afraid, Rape, and Kick questionnaire    Fear of Current or Ex-Partner: No    Emotionally Abused: No    Physically Abused: No    Sexually Abused: No      Levert Feinstein, M.D. Ph.D.  [facility] [address] [place], [place] [address] Ph: [phone] Fax: [phone]  CC:  Tower, Audrie Gallus, MD [address] [place],  [place] [address]  Tower, Audrie Gallus, MD

## 2023-07-25 NOTE — Telephone Encounter (Signed)
Pt states the medication prescribed by Dr Terrace Arabia is not covered by her insurance, pt is asking for a call as to what Dr Terrace Arabia wants to prescribe for her now

## 2023-07-26 ENCOUNTER — Other Ambulatory Visit: Payer: Self-pay | Admitting: Obstetrics and Gynecology

## 2023-07-26 DIAGNOSIS — Z1231 Encounter for screening mammogram for malignant neoplasm of breast: Secondary | ICD-10-CM

## 2023-07-26 NOTE — Telephone Encounter (Signed)
Pt called wanting to know what the update is on getting the insurance to allow the Imitrex to be filled. Please advise.

## 2023-07-29 ENCOUNTER — Telehealth: Payer: Self-pay

## 2023-07-29 ENCOUNTER — Other Ambulatory Visit (HOSPITAL_COMMUNITY): Payer: Self-pay

## 2023-07-29 NOTE — Telephone Encounter (Signed)
Pharmacy Patient Advocate Encounter   Received notification from Physician's Office that prior authorization for Ubrelvy 50MG  tablets is required/requested.   Insurance verification completed.   The patient is insured through [employer] .   Per test claim: PA required; PA submitted to [facility] via CoverMyMeds Key/confirmation #/[id number] Status is pending

## 2023-07-29 NOTE — Telephone Encounter (Signed)
Pt has been informed and verbalized appreciation.

## 2023-07-29 NOTE — Telephone Encounter (Signed)
Phone room please let pt know we sent this to pa team today and marked it to be expedited as high priority.   Rx pa team: Pa for imitrex needed

## 2023-07-29 NOTE — Telephone Encounter (Signed)
She needs ubrevyl for authorization, not Imitrex, she is no longer a good candidate for triptan treatment due to her cerebrovascular disease,

## 2023-07-29 NOTE — Telephone Encounter (Signed)
Patient returned call and verbalized understanding that Dr. Terrace Arabia doesn't want her taking Imitrex and verbalized understanding of how to take Ubrelvy. Encouraged to call back with questions or concerns

## 2023-07-29 NOTE — Telephone Encounter (Signed)
PA request has been Submitted. New Encounter created for follow up. For additional info see Pharmacy Prior Auth telephone encounter from 07/29/2023.

## 2023-07-31 ENCOUNTER — Telehealth: Payer: Self-pay | Admitting: Neurology

## 2023-07-31 ENCOUNTER — Other Ambulatory Visit (HOSPITAL_COMMUNITY): Payer: Self-pay

## 2023-07-31 NOTE — Telephone Encounter (Signed)
Pharmacy Patient Advocate Encounter  Received notification from [facility] that Prior Authorization for Ubrelvy 50MG  tablets has been APPROVED from 10/22/2022 to 10/21/2024. Ran test claim, Copay is $unable to obtain due to Refill Too Soon Rejection, LFD:07/31/2023, NFD: 08/19/2023. This test claim was processed through [facility]- copay amounts may vary at other pharmacies due to pharmacy/plan contracts, or as the patient moves through the different stages of their insurance plan.   PA #/Case ID/Reference #: PA Case ID #: [id number]

## 2023-07-31 NOTE — Telephone Encounter (Signed)
Pt is asking for a call to clarify if  Ubrogepant (UBRELVY) 50 MG TABS is the medication that Dr Terrace Arabia did want her to pick up, or if it is the medication she no longer wants her to take, please call.

## 2023-08-22 ENCOUNTER — Ambulatory Visit (INDEPENDENT_AMBULATORY_CARE_PROVIDER_SITE_OTHER): Payer: Medicare HMO | Admitting: Neurology

## 2023-08-22 DIAGNOSIS — R4182 Altered mental status, unspecified: Secondary | ICD-10-CM

## 2023-08-22 DIAGNOSIS — R410 Disorientation, unspecified: Secondary | ICD-10-CM

## 2023-08-22 DIAGNOSIS — G3184 Mild cognitive impairment, so stated: Secondary | ICD-10-CM

## 2023-08-29 ENCOUNTER — Other Ambulatory Visit: Payer: Self-pay | Admitting: Nephrology

## 2023-08-29 DIAGNOSIS — I7300 Raynaud's syndrome without gangrene: Secondary | ICD-10-CM

## 2023-08-29 DIAGNOSIS — R809 Proteinuria, unspecified: Secondary | ICD-10-CM

## 2023-08-29 DIAGNOSIS — R040 Epistaxis: Secondary | ICD-10-CM | POA: Diagnosis not present

## 2023-08-29 DIAGNOSIS — I129 Hypertensive chronic kidney disease with stage 1 through stage 4 chronic kidney disease, or unspecified chronic kidney disease: Secondary | ICD-10-CM

## 2023-08-29 DIAGNOSIS — N1832 Chronic kidney disease, stage 3b: Secondary | ICD-10-CM

## 2023-08-29 DIAGNOSIS — M329 Systemic lupus erythematosus, unspecified: Secondary | ICD-10-CM

## 2023-09-02 ENCOUNTER — Encounter: Payer: Self-pay | Admitting: *Deleted

## 2023-09-02 ENCOUNTER — Ambulatory Visit
Admission: RE | Admit: 2023-09-02 | Discharge: 2023-09-02 | Disposition: A | Discharge status: Home / Self Care | Payer: Medicare HMO | Source: Ambulatory Visit | Attending: Obstetrics and Gynecology | Admitting: Obstetrics and Gynecology

## 2023-09-02 DIAGNOSIS — Z1231 Encounter for screening mammogram for malignant neoplasm of breast: Secondary | ICD-10-CM | POA: Diagnosis not present

## 2023-09-03 ENCOUNTER — Other Ambulatory Visit: Payer: Self-pay | Admitting: Family Medicine

## 2023-09-10 ENCOUNTER — Other Ambulatory Visit: Payer: Self-pay | Admitting: Family Medicine

## 2023-09-11 ENCOUNTER — Telehealth: Payer: Self-pay | Admitting: Neurology

## 2023-09-11 DIAGNOSIS — G3184 Mild cognitive impairment, so stated: Secondary | ICD-10-CM

## 2023-09-11 DIAGNOSIS — R410 Disorientation, unspecified: Secondary | ICD-10-CM

## 2023-09-11 DIAGNOSIS — I679 Cerebrovascular disease, unspecified: Secondary | ICD-10-CM

## 2023-09-11 DIAGNOSIS — G43709 Chronic migraine without aura, not intractable, without status migrainosus: Secondary | ICD-10-CM

## 2023-09-11 NOTE — Telephone Encounter (Signed)
Pt called wanting to know when she will be getting a call with her EEG results. Please advise.

## 2023-09-11 NOTE — Telephone Encounter (Signed)
Took incoming call from patient who reports she had EEG done in office on 10.31.2024 and was concerned because she had not yet heard her results. I advised I could not yet see results and we would reach out once we had them. Pt verbalized understanding. Pt had no questions at this time but was encouraged to call back if questions arise.

## 2023-09-11 NOTE — Telephone Encounter (Signed)
Called and LVM for patient to call back.

## 2023-09-11 NOTE — Telephone Encounter (Signed)
When did she completed the EEG. I don't see it on the [facility] website

## 2023-09-12 ENCOUNTER — Encounter: Payer: Self-pay | Admitting: Dermatology

## 2023-09-12 ENCOUNTER — Ambulatory Visit: Payer: Medicare HMO | Admitting: Dermatology

## 2023-09-12 DIAGNOSIS — L2489 Irritant contact dermatitis due to other agents: Secondary | ICD-10-CM

## 2023-09-12 DIAGNOSIS — L810 Postinflammatory hyperpigmentation: Secondary | ICD-10-CM | POA: Diagnosis not present

## 2023-09-12 MED ORDER — LEVETIRACETAM 500 MG PO TABS: 500.0000 mg | ORAL_TABLET | Freq: Two times a day (BID) | ORAL | 11 refills | Status: DC

## 2023-09-12 NOTE — Telephone Encounter (Signed)
At 11:22 pt left a vm asking that Dr Terrace Arabia  calls her to discuss what was relayed to one of her daughters.  Pt states 2 of her 3 daughter's are in the medical field and the 1 that Dr Terrace Arabia spoke to is the one that is not.  Pt would like to discuss a medication and get clarity on a few other concerns, the best # to reach pt on is [phone]

## 2023-09-12 NOTE — Telephone Encounter (Signed)
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  Prolonged confusion, this also happened in the setting of unusual challenge, with a background of mild cognitive impairment  Agree starting Keppra 500 mg twice daily, answered related questions,

## 2023-09-12 NOTE — Procedures (Signed)
   HISTORY: 67 year old female with prolonged confusion episode in the setting of mild cognitive impairment  TECHNIQUE:  This is a routine 16 channel EEG recording with one channel devoted to a limited EKG recording.  It was performed during wakefulness, drowsiness and asleep.  Hyperventilation and photic stimulation were performed as activating procedures.  There are minimum muscle and movement artifact noted.  Upon maximum arousal, posterior dominant waking rhythm consistent of rhythmic alpha range activity.  There was intermittent left frontal slowing, Positive photic stimulation and during drowsiness, there was occasionally spike slow wave noted at the left frontal, parietal temporal region, consistent with epileptiform discharge.  During EEG recording, patient developed drowsiness and entered sleep, sleep EEG demonstrated architecture, there were frontal centrally dominant vertex waves and symmetric sleep spindles noted.  EKG demonstrate normal sinus rhythm.  CONCLUSION: This is an abnormal EEG.  There is evidence of epileptiform discharge spike slow wave form at the left hemisphere. Levert Feinstein, M.D. Ph.D.  [facility] [address] [place], [place] [address] Phone: [phone] Fax:      [phone]

## 2023-09-12 NOTE — Telephone Encounter (Signed)
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    Dispense:  60 tablet    Refill:  11

## 2023-09-12 NOTE — Patient Instructions (Addendum)
Hello Jennifer Rose,  Thank you for visiting [place] today. Your dedication to enhancing your skin health is commendable, and it's a pleasure to observe the progress you've made. Here is a summary of the key instructions from today's consultation:  - Medication Changes:   - Discontinue: Stop using tacrolimus for the time being.   - New Cream: Begin using a new cream formulated with tranexamic acid, vitamin C, resveratrol, niacinamide, and hyaluronic acid. This should be applied to your face and neck twice daily.  - Skin Care Routine:   - Application: Use a pea-sized amount of the new cream for both the face and neck areas.   - Moisturizer: At night, apply CeraVe Sheer Moisturizer on top of the cream.   - Sun Protection: Ensure to apply sunscreen every morning to protect your skin.  - Follow-Up:   - Next Appointment: We are scheduled to see you again in four months to evaluate the effectiveness of the treatment.   - Pharmacy Coordination: The pharmacy will reach out to you for payment details and will dispatch the cream to your address.  - General Advice:   - Adverse Reactions: Should you experience any irritation or adverse effects from the new cream, please discontinue its use immediately and contact our office.   - If Necessary: Tacrolimus may be used again if you encounter itching or changes in skin texture.  - Educational Materials:   - During Your Visit: You received a sample of Pension scheme manager to try.  Please feel free to get in touch if you have any questions or concerns. Wishing you a joyful holiday season and eager to witness further improvements in your skin's condition.  Warm regards,  Dr. Langston Reusing [facility] Important Information  Due to recent changes in healthcare laws, you may see results of your pathology and/or laboratory studies on MyChart before the doctors have had a chance to review them. We understand that in some cases there may be results that are  confusing or concerning to you. Please understand that not all results are received at the same time and often the doctors may need to interpret multiple results in order to provide you with the best plan of care or course of treatment. Therefore, we ask that you please give [place] 2 business days to thoroughly review all your results before contacting the office for clarification. Should we see a critical lab result, you will be contacted sooner.   If You Need Anything After Your Visit  If you have any questions or concerns for your doctor, please call our main line at [phone] If no one answers, please leave a voicemail as directed and we will return your call as soon as possible. Messages left after 4 pm will be answered the following business day.   You may also send [place] a message via MyChart. We typically respond to MyChart messages within 1-2 business days.  For prescription refills, please ask your pharmacy to contact our office. Our fax number is [phone].  If you have an urgent issue when the clinic is closed that cannot wait until the next business day, you can page your doctor at the number below.    Please note that while we do our best to be available for urgent issues outside of office hours, we are not available 24/7.   If you have an urgent issue and are unable to reach [place], you may choose to seek medical care at your doctor's office, retail clinic, urgent care  center, or emergency room.  If you have a medical emergency, please immediately call 911 or go to the emergency department. In the event of inclement weather, please call our main line at [phone] for an update on the status of any delays or closures.  Dermatology Medication Tips: Please keep the boxes that topical medications come in in order to help keep track of the instructions about where and how to use these. Pharmacies typically print the medication instructions only on the boxes and not directly on the  medication tubes.   If your medication is too expensive, please contact our office at [phone] or send [place] a message through MyChart.   We are unable to tell what your co-pay for medications will be in advance as this is different depending on your insurance coverage. However, we may be able to find a substitute medication at lower cost or fill out paperwork to get insurance to cover a needed medication.   If a prior authorization is required to get your medication covered by your insurance company, please allow [place] 1-2 business days to complete this process.  Drug prices often vary depending on where the prescription is filled and some pharmacies may offer cheaper prices.  The website www.goodrx.com contains coupons for medications through different pharmacies. The prices here do not account for what the cost may be with help from insurance (it may be cheaper with your insurance), but the website can give you the price if you did not use any insurance.  - You can print the associated coupon and take it with your prescription to the pharmacy.  - You may also stop by our office during regular business hours and pick up a GoodRx coupon card.  - If you need your prescription sent electronically to a different pharmacy, notify our office through [facility] or by phone at [phone]

## 2023-09-12 NOTE — Addendum Note (Signed)
Addended by: Levert Feinstein on: 09/12/2023 10:00 AM   Modules accepted: Orders

## 2023-09-12 NOTE — Progress Notes (Signed)
   Follow-Up Visit   Subjective  Jennifer Rose is a 67 y.o. female who presents for the following: irritant contact dermatitis (lupus triggered secondary to new products) - vitiligo  Patient present today for follow up visit for follow up. Patient was last evaluated on 06/13/23. Patient reports sxs are better. She has been using Clobetasol cream and tacrolimus as prescribed. Patient denies medication changes.  The following portions of the chart were reviewed this encounter and updated as appropriate: medications, allergies, medical  Review of Systems:  No other skin or systemic complaints except as noted in HPI or Assessment and Plan.  Objective  Well appearing patient in no apparent distress; mood and affect are within normal limits.   A focused examination was performed of the following areas:   Relevant exam findings are noted in the Assessment and Plan.               Assessment & Plan    IRRITANT CONTACT DERMATITIS (LUPUS TRIGGERED SECONDARY TO NEW PRODUCTS)  Exam: Hyperpigmented patches surrounded by Hypopigmentation, no longer indurated or elevated    Treatment Plan: - D/C Tacrolimus for now   POST INFLAMMATORY HYPERPIGMENTATION Exam: hyperpigmented patches   Treatment Plan: - Rx Melaxemic Cream from [facility] - use pea sized amount BID morning and night on neck and face. - CeraVe sheer moisturizer sunscreen samples provided. - Follow up in 44mo       Return in about 4 months (around 01/10/2024) for hyperpigmentation .    Documentation: I have reviewed the above documentation for accuracy and completeness, and I agree with the above.   I, Shirron Marcha Solders, CMA, am acting as scribe for [employer], DO.   Langston Reusing, DO

## 2023-09-13 ENCOUNTER — Ambulatory Visit (HOSPITAL_COMMUNITY)
Admission: RE | Admit: 2023-09-13 | Discharge: 2023-09-13 | Disposition: A | Discharge status: Home / Self Care | Payer: Medicare HMO | Source: Ambulatory Visit | Attending: Neurology | Admitting: Neurology

## 2023-09-13 ENCOUNTER — Ambulatory Visit (HOSPITAL_BASED_OUTPATIENT_CLINIC_OR_DEPARTMENT_OTHER)
Admission: RE | Admit: 2023-09-13 | Discharge: 2023-09-13 | Disposition: A | Discharge status: Home / Self Care | Payer: Medicare HMO | Source: Ambulatory Visit | Attending: Neurology | Admitting: Neurology

## 2023-09-13 DIAGNOSIS — I340 Nonrheumatic mitral (valve) insufficiency: Secondary | ICD-10-CM | POA: Insufficient documentation

## 2023-09-13 DIAGNOSIS — I10 Essential (primary) hypertension: Secondary | ICD-10-CM | POA: Insufficient documentation

## 2023-09-13 DIAGNOSIS — R410 Disorientation, unspecified: Secondary | ICD-10-CM

## 2023-09-13 DIAGNOSIS — G459 Transient cerebral ischemic attack, unspecified: Secondary | ICD-10-CM | POA: Diagnosis not present

## 2023-09-13 DIAGNOSIS — G3184 Mild cognitive impairment, so stated: Secondary | ICD-10-CM | POA: Insufficient documentation

## 2023-09-13 DIAGNOSIS — M329 Systemic lupus erythematosus, unspecified: Secondary | ICD-10-CM | POA: Insufficient documentation

## 2023-09-13 LAB — ECHOCARDIOGRAM COMPLETE
AR max vel: 1.45 cm2
AV Area VTI: 1.42 cm2
AV Area mean vel: 1.41 cm2
AV Mean grad: 6 mm[Hg]
AV Peak grad: 8.8 mm[Hg]
Ao pk vel: 1.48 m/s
Area-P 1/2: 3.34 cm2
Calc EF: 63.7 %
S' Lateral: 3.1 cm
Single Plane A2C EF: 57.6 %
Single Plane A4C EF: 67.9 %

## 2023-09-13 NOTE — Telephone Encounter (Signed)
Agree with plans

## 2023-09-16 DIAGNOSIS — N1832 Chronic kidney disease, stage 3b: Secondary | ICD-10-CM | POA: Diagnosis not present

## 2023-09-17 ENCOUNTER — Telehealth: Payer: Self-pay | Admitting: Neurology

## 2023-09-17 NOTE — Telephone Encounter (Signed)
Pt called wanting the RN or MD to call her to discuss some questions regarding her levETIRAcetam (KEPPRA) 500 MG tablet that was just prescribed to her. Please advise.

## 2023-09-17 NOTE — Telephone Encounter (Signed)
Please advise patient take Keppra 500 mg half tablets twice a day, once her body agree with it, may take half tablet in the morning, 1 tablet at night, then titrating to 1 tablet twice a day  She may upgrade the dosage every 1 week

## 2023-09-17 NOTE — Telephone Encounter (Signed)
Returned call to patient, she says she gets a little dizzy after taking her keppra and was wondering if the dose was too high. Advised Dr. Terrace Arabia was  not back until Monday but I would send to covering provider. Patient requested that it go to Dr. Terrace Arabia and she understood she wouldn't get a response until Monday. I reviewed fall and safety precautions as well as staying hydrated. Patient verbalized understanding.

## 2023-09-22 NOTE — Progress Notes (Signed)
Kindly inform the patient that carotid ultrasound study shows no significant narrowing of either carotid artery in the neck

## 2023-09-22 NOTE — Progress Notes (Signed)
Kindly inform the patient that carotid ultrasound study shows no significant narrowing at either carotid bifurcation in the neck

## 2023-09-22 NOTE — Progress Notes (Signed)
Kindly inform the patient that echocardiogram study shows normal pumping and valvular function of the heart.  There is no blood clot noted.

## 2023-09-22 NOTE — Progress Notes (Signed)
Kindly inform the patient that echocardiogram study shows normal pumping and valvular function of the heart.  There is no clot or any worrisome finding noted.

## 2023-09-26 ENCOUNTER — Ambulatory Visit
Admission: RE | Admit: 2023-09-26 | Discharge: 2023-09-26 | Disposition: A | Discharge status: Home / Self Care | Payer: Medicare HMO | Source: Ambulatory Visit | Attending: Nephrology | Admitting: Nephrology

## 2023-09-26 DIAGNOSIS — R809 Proteinuria, unspecified: Secondary | ICD-10-CM

## 2023-09-26 DIAGNOSIS — M329 Systemic lupus erythematosus, unspecified: Secondary | ICD-10-CM

## 2023-09-26 DIAGNOSIS — I7300 Raynaud's syndrome without gangrene: Secondary | ICD-10-CM

## 2023-09-26 DIAGNOSIS — I129 Hypertensive chronic kidney disease with stage 1 through stage 4 chronic kidney disease, or unspecified chronic kidney disease: Secondary | ICD-10-CM

## 2023-09-26 DIAGNOSIS — N1832 Chronic kidney disease, stage 3b: Secondary | ICD-10-CM | POA: Diagnosis not present

## 2023-09-26 DIAGNOSIS — R040 Epistaxis: Secondary | ICD-10-CM

## 2023-10-02 ENCOUNTER — Other Ambulatory Visit: Payer: Self-pay

## 2023-10-02 MED ORDER — SAFETY SEAL MISCELLANEOUS MISC: 1.0000 | Freq: Two times a day (BID) | 2 refills | Status: AC

## 2023-10-03 DIAGNOSIS — G40909 Epilepsy, unspecified, not intractable, without status epilepticus: Secondary | ICD-10-CM | POA: Diagnosis not present

## 2023-10-03 DIAGNOSIS — I7300 Raynaud's syndrome without gangrene: Secondary | ICD-10-CM | POA: Diagnosis not present

## 2023-10-03 DIAGNOSIS — R058 Other specified cough: Secondary | ICD-10-CM | POA: Diagnosis not present

## 2023-10-03 DIAGNOSIS — R808 Other proteinuria: Secondary | ICD-10-CM | POA: Diagnosis not present

## 2023-10-03 DIAGNOSIS — M329 Systemic lupus erythematosus, unspecified: Secondary | ICD-10-CM | POA: Diagnosis not present

## 2023-10-03 DIAGNOSIS — K1121 Acute sialoadenitis: Secondary | ICD-10-CM | POA: Diagnosis not present

## 2023-10-03 DIAGNOSIS — Z6831 Body mass index (BMI) 31.0-31.9, adult: Secondary | ICD-10-CM | POA: Diagnosis not present

## 2023-10-03 DIAGNOSIS — D696 Thrombocytopenia, unspecified: Secondary | ICD-10-CM | POA: Diagnosis not present

## 2023-10-03 DIAGNOSIS — H4602 Optic papillitis, left eye: Secondary | ICD-10-CM | POA: Diagnosis not present

## 2023-10-08 DIAGNOSIS — H4710 Unspecified papilledema: Secondary | ICD-10-CM | POA: Diagnosis not present

## 2023-10-08 DIAGNOSIS — H30031 Focal chorioretinal inflammation, peripheral, right eye: Secondary | ICD-10-CM | POA: Diagnosis not present

## 2023-10-08 DIAGNOSIS — Z79899 Other long term (current) drug therapy: Secondary | ICD-10-CM | POA: Diagnosis not present

## 2023-10-08 DIAGNOSIS — H3581 Retinal edema: Secondary | ICD-10-CM | POA: Diagnosis not present

## 2023-10-08 DIAGNOSIS — H2513 Age-related nuclear cataract, bilateral: Secondary | ICD-10-CM | POA: Diagnosis not present

## 2023-10-24 ENCOUNTER — Other Ambulatory Visit: Payer: Self-pay | Admitting: Gastroenterology

## 2023-10-25 ENCOUNTER — Other Ambulatory Visit: Payer: Self-pay

## 2023-10-25 MED ORDER — OMEPRAZOLE 20 MG PO CPDR: 20.0000 mg | DELAYED_RELEASE_CAPSULE | Freq: Two times a day (BID) | ORAL | 1 refills | Status: DC

## 2023-10-25 NOTE — Telephone Encounter (Signed)
 Pharmacy is calling to have a new prescription sent it needs to be for 180 pills for 90days.

## 2023-10-30 ENCOUNTER — Ambulatory Visit: Payer: BC Managed Care – PPO | Admitting: Neurology

## 2023-11-14 DIAGNOSIS — Z6831 Body mass index (BMI) 31.0-31.9, adult: Secondary | ICD-10-CM | POA: Diagnosis not present

## 2023-11-14 DIAGNOSIS — R808 Other proteinuria: Secondary | ICD-10-CM | POA: Diagnosis not present

## 2023-11-14 DIAGNOSIS — E669 Obesity, unspecified: Secondary | ICD-10-CM | POA: Diagnosis not present

## 2023-11-14 DIAGNOSIS — K1121 Acute sialoadenitis: Secondary | ICD-10-CM | POA: Diagnosis not present

## 2023-11-14 DIAGNOSIS — H4602 Optic papillitis, left eye: Secondary | ICD-10-CM | POA: Diagnosis not present

## 2023-11-14 DIAGNOSIS — M329 Systemic lupus erythematosus, unspecified: Secondary | ICD-10-CM | POA: Diagnosis not present

## 2023-11-14 DIAGNOSIS — I7300 Raynaud's syndrome without gangrene: Secondary | ICD-10-CM | POA: Diagnosis not present

## 2023-11-14 DIAGNOSIS — R058 Other specified cough: Secondary | ICD-10-CM | POA: Diagnosis not present

## 2023-11-14 DIAGNOSIS — D696 Thrombocytopenia, unspecified: Secondary | ICD-10-CM | POA: Diagnosis not present

## 2023-11-15 LAB — LAB REPORT - SCANNED: EGFR: 47

## 2023-11-28 DIAGNOSIS — M329 Systemic lupus erythematosus, unspecified: Secondary | ICD-10-CM | POA: Diagnosis not present

## 2023-11-28 DIAGNOSIS — N1832 Chronic kidney disease, stage 3b: Secondary | ICD-10-CM | POA: Diagnosis not present

## 2023-11-28 DIAGNOSIS — I129 Hypertensive chronic kidney disease with stage 1 through stage 4 chronic kidney disease, or unspecified chronic kidney disease: Secondary | ICD-10-CM | POA: Diagnosis not present

## 2023-11-28 DIAGNOSIS — R809 Proteinuria, unspecified: Secondary | ICD-10-CM | POA: Diagnosis not present

## 2023-11-29 LAB — LAB REPORT - SCANNED: Creatinine, POC: 178.3 mg/dL

## 2024-01-09 ENCOUNTER — Ambulatory Visit: Payer: Medicare HMO | Admitting: Dermatology

## 2024-01-16 DIAGNOSIS — Z79899 Other long term (current) drug therapy: Secondary | ICD-10-CM | POA: Diagnosis not present

## 2024-01-16 DIAGNOSIS — M3210 Systemic lupus erythematosus, organ or system involvement unspecified: Secondary | ICD-10-CM | POA: Diagnosis not present

## 2024-01-16 DIAGNOSIS — H25813 Combined forms of age-related cataract, bilateral: Secondary | ICD-10-CM | POA: Diagnosis not present

## 2024-01-16 DIAGNOSIS — H4710 Unspecified papilledema: Secondary | ICD-10-CM | POA: Diagnosis not present

## 2024-01-16 DIAGNOSIS — H40023 Open angle with borderline findings, high risk, bilateral: Secondary | ICD-10-CM | POA: Diagnosis not present

## 2024-01-20 ENCOUNTER — Encounter: Payer: Self-pay | Admitting: Dermatology

## 2024-01-20 ENCOUNTER — Ambulatory Visit (INDEPENDENT_AMBULATORY_CARE_PROVIDER_SITE_OTHER): Admitting: Dermatology

## 2024-01-20 VITALS — BP 139/80 | HR 70

## 2024-01-20 DIAGNOSIS — M329 Systemic lupus erythematosus, unspecified: Secondary | ICD-10-CM

## 2024-01-20 DIAGNOSIS — L811 Chloasma: Secondary | ICD-10-CM

## 2024-01-20 DIAGNOSIS — N1832 Chronic kidney disease, stage 3b: Secondary | ICD-10-CM | POA: Diagnosis not present

## 2024-01-20 DIAGNOSIS — L810 Postinflammatory hyperpigmentation: Secondary | ICD-10-CM

## 2024-01-20 NOTE — Progress Notes (Signed)
   Follow-Up Visit   Subjective  Jennifer Rose is a 68 y.o. female who presents for the following: Patient here to address concerns about hyperpigmentation.  The patient has spots, moles and lesions to be evaluated, some may be new or changing and the patient may have concern these could be cancer.   The following portions of the chart were reviewed this encounter and updated as appropriate: medications, allergies, medical history  Review of Systems:  No other skin or systemic complaints except as noted in HPI or Assessment and Plan.  Objective  Well appearing patient in no apparent distress; mood and affect are within normal limits.   A focused examination was performed of the following areas:  Face and neck  Relevant exam findings are noted in the Assessment and Plan.    Assessment & Plan   1. Melasma - Assessment: Patient's melasma has shown improvement, with complete clearance of the nose area. Forehead still shows some residual hyperpigmentation, particularly on the sides. Current treatment regimen, including sunscreen use, has been effective but requires continued adherence for optimal results.  - Plan:    Continue Medrock cream (melasmic) twice daily    Apply sunscreen in the morning only    Start Excedrin Radiant Tone with thiamidol, over-the-counter, applied morning and night    Use pea-sized amount of Excedrin Radiant Tone on forehead, nose area, and sides of neck    Emphasize consistent use of sunscreen and wearing hats for sun protection  2. Lupus - Assessment: Patient currently has no active flares of inflammation in the skin. A management plan has been established for potential future flares.  - Plan:    For flares: Use hydrocortisone 2.5 % twice daily for 2 weeks    If flare persists: Switch to tacrolimus for at least 2 weeks    Maintain anti-inflammatory treatment for a minimum of 4 weeks during flares    After inflammation resolves, resume Lupus  treatment    Tacrolimus can be used for extended periods beyond 2 weeks if necessary  Continue Benlysta injections every Thursday    Continue Plaquenil as prescribed    Maintain regular ophthalmologic check-ups for Plaquenil monitoring   POST-INFLAMMATORY HYPERPIGMENTATION (2) Head - Anterior (Face), Neck - Posterior Continue using Melaxemic 2x per day and Cerave moisturizing sunscreen in the morning.  Start using Eucerin radiant tone to further help the hyperpigmentation. If you develop another Lupus flare start using hydrocortisone 2x per day for two weeks then tacrolimus for two weeks before restarting the regimen for the hyperpigmentation.  Return in about 6 months (around 07/21/2024) for Hperpigmentation f/u.  IManual Meier, Surg Tech III, am acting as scribe for [employer], DO.   Documentation: I have reviewed the above documentation for accuracy and completeness, and I agree with the above.  Langston Reusing, DO

## 2024-01-20 NOTE — Patient Instructions (Addendum)
 Hello Jennifer Rose,  Thank you for visiting today. Here is a summary of the key instructions:  - Medications:   - Continue using Medrock cream (melasmic) twice a day   - Start Archivist with thiamidol every day, morning and night   - Apply a pea-sized amount to forehead, nose area, and sides of neck  - For Lupus Skin flares:   - Use hydrocortisone 2.5 cream twice a day for 2 weeks   - Then switch to tacrolimus for at least 2 weeks  - Other Medications:   - Continue taking Benlysta every Thursday   - Continue taking Plaquenil as prescribed  - Skin Care:   - Apply sunscreen in the morning   - Wear hats for sun protection  - Follow-up:   - Schedule next appointment in 6 months   - Come in sooner if you have a flare that doesn't respond to hydrocortisone and tacrolimus  Please reach out if you have any questions or concerns.  Warm regards,  Dr. Langston Reusing [facility]     Important Information  Due to recent changes in healthcare laws, you may see results of your pathology and/or laboratory studies on MyChart before the doctors have had a chance to review them. We understand that in some cases there may be results that are confusing or concerning to you. Please understand that not all results are received at the same time and often the doctors may need to interpret multiple results in order to provide you with the best plan of care or course of treatment. Therefore, we ask that you please give [place] 2 business days to thoroughly review all your results before contacting the office for clarification. Should we see a critical lab result, you will be contacted sooner.   If You Need Anything After Your Visit  If you have any questions or concerns for your doctor, please call our main line at [phone] If no one answers, please leave a voicemail as directed and we will return your call as soon as possible. Messages left after 4 pm will be answered the following business day.    You may also send [place] a message via MyChart. We typically respond to MyChart messages within 1-2 business days.  For prescription refills, please ask your pharmacy to contact our office. Our fax number is [phone].  If you have an urgent issue when the clinic is closed that cannot wait until the next business day, you can page your doctor at the number below.    Please note that while we do our best to be available for urgent issues outside of office hours, we are not available 24/7.   If you have an urgent issue and are unable to reach [place], you may choose to seek medical care at your doctor's office, retail clinic, urgent care center, or emergency room.  If you have a medical emergency, please immediately call 911 or go to the emergency department. In the event of inclement weather, please call our main line at [phone] for an update on the status of any delays or closures.  Dermatology Medication Tips: Please keep the boxes that topical medications come in in order to help keep track of the instructions about where and how to use these. Pharmacies typically print the medication instructions only on the boxes and not directly on the medication tubes.   If your medication is too expensive, please contact our office at [phone] or send [place] a message through MyChart.  We are unable to tell what your co-pay for medications will be in advance as this is different depending on your insurance coverage. However, we may be able to find a substitute medication at lower cost or fill out paperwork to get insurance to cover a needed medication.   If a prior authorization is required to get your medication covered by your insurance company, please allow [place] 1-2 business days to complete this process.  Drug prices often vary depending on where the prescription is filled and some pharmacies may offer cheaper prices.  The website www.goodrx.com contains coupons for medications through different  pharmacies. The prices here do not account for what the cost may be with help from insurance (it may be cheaper with your insurance), but the website can give you the price if you did not use any insurance.  - You can print the associated coupon and take it with your prescription to the pharmacy.  - You may also stop by our office during regular business hours and pick up a GoodRx coupon card.  - If you need your prescription sent electronically to a different pharmacy, notify our office through [facility] or by phone at [phone]

## 2024-01-20 NOTE — Progress Notes (Signed)
   Follow-Up Visit   Subjective  Jennifer Rose is a 68 y.o. female who presents for the following: Patient here to address concerns about hyperpigmentation.  The patient has spots, moles and lesions to be evaluated, some may be new or changing and the patient may have concern these could be cancer.   The following portions of the chart were reviewed this encounter and updated as appropriate: medications, allergies, medical history  Review of Systems:  No other skin or systemic complaints except as noted in HPI or Assessment and Plan.  Objective  Well appearing patient in no apparent distress; mood and affect are within normal limits.   A focused examination was performed of the following areas:  Face and neck  Relevant exam findings are noted in the Assessment and Plan.    Assessment & Plan     POST-INFLAMMATORY HYPERPIGMENTATION (2) Head - Anterior (Face), Neck - Posterior Continue using Melaxemic 2x per day and Cerave moisturizing sunscreen in the morning.  Start using.........  Marland Kitchen If you develop another flair start using hydrocortisone and tacrolimus for two weeks before restarting the regimen for the hyperpigmentation.  Return in about 6 months (around 07/21/2024) for Hperpigmentation f/u.  IManual Meier, Surg Tech III, am acting as scribe for [employer], DO.   Documentation: I have reviewed the above documentation for accuracy and completeness, and I agree with the above.  Langston Reusing, DO

## 2024-01-21 DIAGNOSIS — M329 Systemic lupus erythematosus, unspecified: Secondary | ICD-10-CM | POA: Diagnosis not present

## 2024-01-21 DIAGNOSIS — N1832 Chronic kidney disease, stage 3b: Secondary | ICD-10-CM | POA: Diagnosis not present

## 2024-01-21 DIAGNOSIS — R809 Proteinuria, unspecified: Secondary | ICD-10-CM | POA: Diagnosis not present

## 2024-01-21 DIAGNOSIS — I129 Hypertensive chronic kidney disease with stage 1 through stage 4 chronic kidney disease, or unspecified chronic kidney disease: Secondary | ICD-10-CM | POA: Diagnosis not present

## 2024-02-11 NOTE — Progress Notes (Unsigned)
 No chief complaint on file.     ASSESSMENT AND PLAN  Jennifer Rose is a 68 y.o. female   Cognitive impairment meant  Jennifer Rose with evidence of prior strokes and moderate to severe chronic small vessel disease on prior MRI MoCA examination ***/30 (prior 24/30)   Cerebral vascular disease  2D echo benign  Carotid ultrasound bilateral ECA <50% stenosis, near normal extracranial vessels bilaterally  Chronic migraine headaches  Ubrelvy  as needed      DIAGNOSTIC DATA (LABS, IMAGING, TESTING) - I reviewed patient records, labs, notes, testing and imaging myself where available.   MEDICAL HISTORY:   Update 02/12/2024 JM: Patient returns for follow-up visit.       Consult visit on 07/25/2023 Dr. Gracie Lav: Jennifer Rose is a 68 year old female, seen in request by her primary care doctor [place], [place] for evaluation of cognitive change, initial evaluation was with her daughter July 25, 2023   History is obtained from the patient and review of electronic medical records. I personally reviewed pertinent available imaging films in PACS.   PMHx of  HTN Lupus more than 30 years, joint pain, skin rashes, muscle ache, optic nerve, Dr. 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  Hospital admission in September 2024 for increased confusion for 24 hours, it was a day that interrupted her routine, she has to get up early to help her daughter, she drove her daughter to the bank, then supposed to drive back to her daughter's house to meet somebody, on her way driving back, she called her daughter multiple times, asking what she is supposed to do,  Even when her daughter meet her few hours later, she was noticed to have brain fog, repeating herself,  confused,  MRI of the brain with without contrast July 08, 2023 showed no acute process, advanced volume loss for age, moderate to severe chronic small vessel disease, lacunar 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 Kirtland Perfect at [facility] rheumatology, for lupus, seen by Dr. Salomon Cree May 2024 for thrombocytopenia, pathology, platelet clumps, thought that systemic lupus can cause platelet clumping outside the body, consistent with pseudothrombocytopenia, may also have an immune component with her history of lupus, may produce antibodies against platelet,  Ophthalmology evaluation by Dr.Zamora in March 2023, for optic disc edema, OD, obscuration of disc vessels with hemorrhage, visual acuity remain 20/20, CT of the brain and orbit with without contrast showed no retrobulbar pathology, hyperfluorescence staining and leakage of disc OD, consistent with lupus associated papillitis OD, she was treated with prednisone  40mg  tapering down slowly, 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  There is no height or weight on file to calculate BMI.  PHYSICAL EXAMNIATION:  Gen: NAD, conversant, well nourised, well groomed  Cardiovascular: Regular rate rhythm, no peripheral edema, warm, nontender. Eyes: Conjunctivae clear without exudates or hemorrhage Neck: Supple, no carotid bruits. Pulmonary: Clear to auscultation bilaterally   NEUROLOGICAL EXAM:  MENTAL STATUS: Speech/cognition: Awake,  alert, oriented to history taking and casual conversation    07/25/2023    8:07 AM  Montreal Cognitive Assessment   Visuospatial/ Executive (0/5) 4  Naming (0/3) 3  Attention: Read list of digits (0/2) 2  Attention: Read list of letters (0/1) 1  Attention: Serial 7 subtraction starting at 100 (0/3) 3  Language: Repeat phrase (0/2) 2  Language : Fluency (0/1) 0  Abstraction (0/2) 2  Delayed Recall (0/5) 1  Orientation (0/6) 6  Total 24    CRANIAL NERVES: CN II: Visual fields are full to confrontation. Pupils are round equal and briskly reactive to light. CN III, IV, VI: extraocular movement are normal. No ptosis. CN V: Facial sensation is intact to light touch CN VII: Face is symmetric with normal eye closure  CN VIII: Hearing is normal to causal conversation. CN IX, X: Phonation is normal. CN XI: Head turning and shoulder shrug are intact  MOTOR: There is no pronator drift of out-stretched arms. Muscle bulk and tone are normal. Muscle strength is normal.  REFLEXES: Reflexes are 2+ and symmetric at the biceps, triceps, knees, and ankles. Plantar responses are flexor.  SENSORY: Intact to light touch, pinprick and vibratory sensation are intact in fingers and toes.  COORDINATION: There is no trunk or limb dysmetria noted.  GAIT/STANCE: Posture is normal. Gait is steady with normal steps, base, arm swing, and turning. Heel and toe walking are normal. Tandem gait is normal.  Romberg is absent.  REVIEW OF SYSTEMS:  Full 14 system review of systems performed and notable only for as above All other review of systems were negative.   ALLERGIES: Allergies  Allergen Reactions   Shrimp Extract Shortness Of Breath    Throat swelling,   Guaifenesin Other (See Comments)    hiccups   Mucinex Clear & Cool Day-Night     Hiccups     HOME MEDICATIONS: Current Outpatient Medications  Medication Sig Dispense Refill   amLODipine  (NORVASC ) 5 MG tablet TAKE ONE TABLET BY MOUTH ONCE  A DAY 90 tablet 1   BENLYSTA 200 MG/ML SOAJ Inject as directed once a week.     Blood Pressure KIT Check BP every morning and evening and keep a log 1 kit 0   cholecalciferol (VITAMIN D ) 1000 UNITS tablet Take 2,000 Units by mouth daily.     fexofenadine  (ALLEGRA ) 180 MG tablet Take 180 mg by mouth daily as needed for allergies or rhinitis.     fluticasone  (FLONASE ) 50 MCG/ACT nasal spray USE 1 TO 2 SPRAYS IN EACH NOSTRIL DAILY (Patient taking differently: Place 1-2 sprays into both nostrils daily as needed for allergies.) 16 g 5   hydrocortisone  2.5 % cream Apply topically 2 (two) times daily as needed (Rash). 90 g 3   hydroxychloroquine  (PLAQUENIL ) 200 MG tablet Take 200 mg by mouth 2 (two) times daily.     latanoprost (XALATAN) 0.005 % ophthalmic solution Place 1 drop into both eyes daily.     levETIRAcetam  (KEPPRA ) 500 MG tablet Take 1 tablet (500 mg total) by mouth 2 (two) times daily. 60 tablet 11   losartan  (COZAAR ) 50 MG tablet TAKE ONE TABLET BY MOUTH ONCE A DAY 90 tablet 1   meclizine (ANTIVERT) 12.5 MG tablet Take 12.5 mg by mouth 3 (three) times daily  as needed.     omeprazole  (PRILOSEC) 20 MG capsule Take 1 capsule (20 mg total) by mouth 2 (two) times daily before a meal. 180 capsule 1   potassium chloride  (KLOR-CON ) 10 MEQ tablet Take 2 tablets (20 mEq total) by mouth daily. (Patient taking differently: Take 10 mEq by mouth daily as needed (Low potassium).) 180 tablet 3   Safety Seal Miscellaneous MISC Apply 1 application  topically in the morning and at bedtime. Apply to face and neck 30 g 2   tacrolimus  (PROTOPIC ) 0.1 % ointment Apply topically 2 (two) times daily. Apply to affected areas twice daily for 2 weeks.  Alternate with Clobetasol  cream on areas on body 100 g 2   Ubrogepant  (UBRELVY ) 50 MG TABS Take 1 tab at onset of migraine.  May repeat in 2 hrs, if needed.  Max dose: 2 tabs/day. This is a 30 day prescription. 12 tablet 11   No current facility-administered medications  for this visit.    PAST MEDICAL HISTORY: Past Medical History:  Diagnosis Date   Allergy    allergic rhinitis   Eye pressure    GERD (gastroesophageal reflux disease)    Hypertension 03/22/2004   Lupus    Uterine fibroid 08/22/2000   per GYN    PAST SURGICAL HISTORY: Past Surgical History:  Procedure Laterality Date   BREAST CYST EXCISION Right 36 yrs ago   x2   CESAREAN SECTION     x 3   CHOLECYSTECTOMY     ENDOMETRIAL ABLATION  01/2002   polyps?DUB    FAMILY HISTORY: Family History  Problem Relation Age of Onset   Blindness Mother    Stroke Mother    Diabetes Mother    Glaucoma Mother    Hypertension Mother    Glaucoma Father    Diabetes Father    Hypertension Father    Prostate cancer Father    Stroke Sister    Diabetes Sister    Glaucoma Maternal Grandmother    Diabetes Maternal Grandmother    Hypertension Maternal Grandmother    Colon cancer Maternal Grandmother    Heart attack Maternal Grandfather    Glaucoma Paternal Grandmother    Heart disease Paternal Grandmother    Colitis Daughter    Hypertension Daughter    Diabetes Daughter     SOCIAL HISTORY: Social History   Socioeconomic History   Marital status: Divorced    Spouse name: Not on file   Number of children: 3   Years of education: Not on file   Highest education level: Not on file  Occupational History   Occupation: retired Medical laboratory scientific officer  Tobacco Use   Smoking status: Never   Smokeless tobacco: Never  Vaping Use   Vaping status: Never Used  Substance and Sexual Activity   Alcohol use: No    Alcohol/week: 0.0 standard drinks of alcohol   Drug use: No   Sexual activity: Not on file  Other Topics Concern   Not on file  Social History Narrative   Not on file   Social Drivers of Health   Financial Resource Strain: Not on file  Food Insecurity: No Food Insecurity (07/09/2023)   Hunger Vital Sign    Worried About Running Out of Food in the Last Year: Never true    Ran Out of  Food in the Last Year: Never true  Transportation Needs: No Transportation Needs (07/09/2023)   PRAPARE - Transportation    Lack of Transportation (Medical): No    Lack of  Transportation (Non-Medical): No  Physical Activity: Not on file  Stress: Not on file  Social Connections: Unknown (03/06/2022)   Received from [facility], [facility]   Social Network    Social Network: Not on file  Intimate Partner Violence: Not At Risk (07/09/2023)   Humiliation, Afraid, Rape, and Kick questionnaire    Fear of Current or Ex-Partner: No    Emotionally Abused: No    Physically Abused: No    Sexually Abused: No      I spent *** minutes of face-to-face and non-face-to-face time with patient.  This included previsit chart review, lab review, study review, order entry, electronic health record documentation, patient education and discussion regarding above diagnoses and treatment plan and answered all other questions to patient's satisfaction  Johny Nap, [facility]  [facility] [address] [place], [place] [address]  Phone [phone] Fax [phone] Note: This document was prepared with digital dictation and possible smart phrase technology. Any transcriptional errors that result from this process are unintentional.

## 2024-02-12 ENCOUNTER — Encounter: Payer: Self-pay | Admitting: Adult Health

## 2024-02-12 ENCOUNTER — Ambulatory Visit (INDEPENDENT_AMBULATORY_CARE_PROVIDER_SITE_OTHER): Payer: Medicare PPO | Admitting: Adult Health

## 2024-02-12 ENCOUNTER — Telehealth: Payer: Self-pay | Admitting: Pharmacist

## 2024-02-12 VITALS — BP 152/84 | HR 72 | Ht 65.0 in | Wt 188.8 lb

## 2024-02-12 DIAGNOSIS — M329 Systemic lupus erythematosus, unspecified: Secondary | ICD-10-CM | POA: Diagnosis not present

## 2024-02-12 DIAGNOSIS — I679 Cerebrovascular disease, unspecified: Secondary | ICD-10-CM

## 2024-02-12 DIAGNOSIS — G3184 Mild cognitive impairment, so stated: Secondary | ICD-10-CM | POA: Diagnosis not present

## 2024-02-12 DIAGNOSIS — G43709 Chronic migraine without aura, not intractable, without status migrainosus: Secondary | ICD-10-CM

## 2024-02-12 DIAGNOSIS — G40209 Localization-related (focal) (partial) symptomatic epilepsy and epileptic syndromes with complex partial seizures, not intractable, without status epilepticus: Secondary | ICD-10-CM | POA: Diagnosis not present

## 2024-02-12 MED ORDER — NURTEC 75 MG PO TBDP: 75.0000 mg | ORAL_TABLET | ORAL | 11 refills | Status: DC | PRN

## 2024-02-12 MED ORDER — LEVETIRACETAM ER 500 MG PO TB24: 1000.0000 mg | ORAL_TABLET | Freq: Every evening | ORAL | 5 refills | Status: DC

## 2024-02-12 NOTE — Telephone Encounter (Signed)
 Pharmacy Patient Advocate Encounter   Received notification from Patient Pharmacy that prior authorization for Nurtec 75MG  dispersible tablets is required/requested.   Insurance verification completed.   The patient is insured through [employer] .   Per test claim: PA required; PA submitted to above mentioned insurance via CoverMyMeds Key/confirmation #/EOC [id number] Status is pending

## 2024-02-12 NOTE — Telephone Encounter (Signed)
 Pharmacy Patient Advocate Encounter  Received notification from [employer] that Prior Authorization for Nurtec 75MG  dispersible tablets has been APPROVED from 10/23/2023 to 10/21/2024   PA #/Case ID/Reference #: [id number]

## 2024-02-12 NOTE — Patient Instructions (Addendum)
 Your Plan:  Start Nurtec as needed for migraine headache  Stop Ubrelvy    Switch Keppra  immediate release to Keppra  extended release 1000mg  nightly (2 500mg  tablets) Please call with any seizure type activity   Highly recommend routine physical and cognitive exercises as well as ensuring good sleep, healthy diet and management of vascular risk factors  Continue Ubrelvy  as needed for migraine headache      Follow up in 6 months with Dr. Gracie Lav or call earlier if needed      Thank you for coming to see us  at [facility]. I hope we have been able to provide you high quality care today.  You may receive a patient satisfaction survey over the next few weeks. We would appreciate your feedback and comments so that we may continue to improve ourselves and the health of our patients.

## 2024-02-24 DIAGNOSIS — R808 Other proteinuria: Secondary | ICD-10-CM | POA: Diagnosis not present

## 2024-02-24 DIAGNOSIS — H4602 Optic papillitis, left eye: Secondary | ICD-10-CM | POA: Diagnosis not present

## 2024-02-24 DIAGNOSIS — G40909 Epilepsy, unspecified, not intractable, without status epilepticus: Secondary | ICD-10-CM | POA: Diagnosis not present

## 2024-02-24 DIAGNOSIS — D696 Thrombocytopenia, unspecified: Secondary | ICD-10-CM | POA: Diagnosis not present

## 2024-02-24 DIAGNOSIS — M329 Systemic lupus erythematosus, unspecified: Secondary | ICD-10-CM | POA: Diagnosis not present

## 2024-02-24 DIAGNOSIS — Z79899 Other long term (current) drug therapy: Secondary | ICD-10-CM | POA: Diagnosis not present

## 2024-02-24 DIAGNOSIS — I7300 Raynaud's syndrome without gangrene: Secondary | ICD-10-CM | POA: Diagnosis not present

## 2024-02-24 DIAGNOSIS — R058 Other specified cough: Secondary | ICD-10-CM | POA: Diagnosis not present

## 2024-02-24 DIAGNOSIS — M6281 Muscle weakness (generalized): Secondary | ICD-10-CM | POA: Diagnosis not present

## 2024-02-24 DIAGNOSIS — K1121 Acute sialoadenitis: Secondary | ICD-10-CM | POA: Diagnosis not present

## 2024-02-25 DIAGNOSIS — Z01419 Encounter for gynecological examination (general) (routine) without abnormal findings: Secondary | ICD-10-CM | POA: Diagnosis not present

## 2024-02-26 LAB — LAB REPORT - SCANNED
Creatinine, POC: 239.6 mg/dL
EGFR: 47

## 2024-03-04 ENCOUNTER — Other Ambulatory Visit: Payer: Self-pay | Admitting: Family Medicine

## 2024-03-23 DIAGNOSIS — N1832 Chronic kidney disease, stage 3b: Secondary | ICD-10-CM | POA: Diagnosis not present

## 2024-03-23 DIAGNOSIS — Z79899 Other long term (current) drug therapy: Secondary | ICD-10-CM | POA: Diagnosis not present

## 2024-03-23 DIAGNOSIS — M3210 Systemic lupus erythematosus, organ or system involvement unspecified: Secondary | ICD-10-CM | POA: Diagnosis not present

## 2024-04-02 DIAGNOSIS — R809 Proteinuria, unspecified: Secondary | ICD-10-CM | POA: Diagnosis not present

## 2024-04-02 DIAGNOSIS — N1831 Chronic kidney disease, stage 3a: Secondary | ICD-10-CM | POA: Diagnosis not present

## 2024-04-02 DIAGNOSIS — I129 Hypertensive chronic kidney disease with stage 1 through stage 4 chronic kidney disease, or unspecified chronic kidney disease: Secondary | ICD-10-CM | POA: Diagnosis not present

## 2024-04-02 DIAGNOSIS — M329 Systemic lupus erythematosus, unspecified: Secondary | ICD-10-CM | POA: Diagnosis not present

## 2024-04-07 ENCOUNTER — Telehealth: Payer: Self-pay

## 2024-04-07 NOTE — Telephone Encounter (Signed)
 Jennifer Rose

## 2024-04-09 ENCOUNTER — Other Ambulatory Visit: Payer: Self-pay | Admitting: Gastroenterology

## 2024-04-21 ENCOUNTER — Ambulatory Visit (INDEPENDENT_AMBULATORY_CARE_PROVIDER_SITE_OTHER)

## 2024-04-21 VITALS — BP 118/82 | Ht 66.0 in | Wt 191.8 lb

## 2024-04-21 DIAGNOSIS — Z0000 Encounter for general adult medical examination without abnormal findings: Secondary | ICD-10-CM | POA: Diagnosis not present

## 2024-04-21 NOTE — Progress Notes (Signed)
 Subjective:   Jennifer Rose is a 68 y.o. who presents for a Medicare Wellness preventive visit.  As a reminder, Annual Wellness Visits don't include a physical exam, and some assessments may be limited, especially if this visit is performed virtually. We may recommend an in-person follow-up visit with your provider if needed.  Visit Complete: In person  Persons Participating in Visit: Patient assisted by daughter. [name]  AWV Questionnaire: No: Patient Medicare AWV questionnaire was not completed prior to this visit.  Cardiac Risk Factors include: advanced age (>47men, >20 women);diabetes mellitus;dyslipidemia;hypertension;obesity (BMI >30kg/m2);sedentary lifestyle     Objective:    Today's Vitals   04/21/24 1443  BP: 118/82  Weight: 191 lb 12.8 oz (87 kg)  Height: 5' 6 (1.676 m)   Body mass index is 30.96 kg/m.     04/21/2024    3:11 PM 07/09/2023    1:00 PM 07/09/2023   12:00 AM 04/25/2023   12:00 PM 05/02/2022    2:44 PM  Advanced Directives  Does Patient Have a Medical Advance Directive? No No Unable to assess, patient is non-responsive or altered mental status No No  Would patient like information on creating a medical advance directive?  No - Patient declined  No - Patient declined No - Patient declined    Current Medications (verified) Outpatient Encounter Medications as of 04/21/2024  Medication Sig   amLODipine  (NORVASC ) 5 MG tablet TAKE ONE TABLET BY MOUTH ONCE A DAY   BENLYSTA 200 MG/ML SOAJ Inject as directed once a week.   Blood Pressure KIT Check BP every morning and evening and keep a log   cholecalciferol (VITAMIN D ) 1000 UNITS tablet Take 2,000 Units by mouth daily.   fexofenadine  (ALLEGRA ) 180 MG tablet Take 180 mg by mouth daily as needed for allergies or rhinitis.   fluticasone  (FLONASE ) 50 MCG/ACT nasal spray USE 1 TO 2 SPRAYS IN EACH NOSTRIL DAILY   hydrocortisone  2.5 % cream Apply topically 2 (two) times daily as needed (Rash).    hydroxychloroquine  (PLAQUENIL ) 200 MG tablet Take 200 mg by mouth 2 (two) times daily.   latanoprost (XALATAN) 0.005 % ophthalmic solution Place 1 drop into both eyes daily.   levETIRAcetam  (KEPPRA  XR) 500 MG 24 hr tablet Take 2 tablets (1,000 mg total) by mouth at bedtime.   losartan  (COZAAR ) 50 MG tablet TAKE ONE TABLET BY MOUTH ONCE A DAY   meclizine (ANTIVERT) 12.5 MG tablet Take 12.5 mg by mouth 3 (three) times daily as needed.   omeprazole  (PRILOSEC) 20 MG capsule TAKE ONE CAPSULE (20 MG TOTAL) BY MOUTH TWO TIMES DAILY BEFORE A MEAL.   potassium chloride  (KLOR-CON ) 10 MEQ tablet Take 2 tablets (20 mEq total) by mouth daily.   Rimegepant Sulfate (NURTEC) 75 MG TBDP Take 1 tablet (75 mg total) by mouth as needed.   Safety Seal Miscellaneous MISC Apply 1 application  topically in the morning and at bedtime. Apply to face and neck   tacrolimus  (PROTOPIC ) 0.1 % ointment Apply topically 2 (two) times daily. Apply to affected areas twice daily for 2 weeks.  Alternate with Clobetasol  cream on areas on body   No facility-administered encounter medications on file as of 04/21/2024.    Allergies (verified) Shrimp extract, Guaifenesin, and Mucinex clear & cool day-night   History: Past Medical History:  Diagnosis Date   Allergy    allergic rhinitis   Eye pressure    GERD (gastroesophageal reflux disease)    Hypertension 03/22/2004   Lupus  Uterine fibroid 08/22/2000   per GYN   Past Surgical History:  Procedure Laterality Date   BREAST CYST EXCISION Right 38 yrs ago   x2   CESAREAN SECTION     x 3   CHOLECYSTECTOMY     ENDOMETRIAL ABLATION  01/2002   polyps?DUB   Family History  Problem Relation Age of Onset   Blindness Mother    Stroke Mother    Diabetes Mother    Glaucoma Mother    Hypertension Mother    Glaucoma Father    Diabetes Father    Hypertension Father    Prostate cancer Father    Stroke Sister    Diabetes Sister    Glaucoma Maternal Grandmother    Diabetes  Maternal Grandmother    Hypertension Maternal Grandmother    Colon cancer Maternal Grandmother    Heart attack Maternal Grandfather    Glaucoma Paternal Grandmother    Heart disease Paternal Grandmother    Colitis Daughter    Hypertension Daughter    Diabetes Daughter    Social History   Socioeconomic History   Marital status: Divorced    Spouse name: Not on file   Number of children: 3   Years of education: Not on file   Highest education level: Not on file  Occupational History   Occupation: retired Medical laboratory scientific officer  Tobacco Use   Smoking status: Never   Smokeless tobacco: Never  Vaping Use   Vaping status: Never Used  Substance and Sexual Activity   Alcohol use: No    Alcohol/week: 0.0 standard drinks of alcohol   Drug use: No   Sexual activity: Not on file  Other Topics Concern   Not on file  Social History Narrative   Not on file   Social Drivers of Health   Financial Resource Strain: Low Risk  (04/21/2024)   Overall Financial Resource Strain (CARDIA)    Difficulty of Paying Living Expenses: Not hard at all  Food Insecurity: No Food Insecurity (04/21/2024)   Hunger Vital Sign    Worried About Running Out of Food in the Last Year: Never true    Ran Out of Food in the Last Year: Never true  Transportation Needs: No Transportation Needs (04/21/2024)   PRAPARE - Administrator, Civil Service (Medical): No    Lack of Transportation (Non-Medical): No  Physical Activity: Inactive (04/21/2024)   Exercise Vital Sign    Days of Exercise per Week: 0 days    Minutes of Exercise per Session: 0 min  Stress: No Stress Concern Present (04/21/2024)   [employer] of Occupational Health - Occupational Stress Questionnaire    Feeling of Stress: Only a little  Social Connections: Socially Isolated (04/21/2024)   Social Connection and Isolation Panel    Frequency of Communication with Friends and Family: More than three times a week    Frequency of Social Gatherings with  Friends and Family: Three times a week    Attends Religious Services: Never    Active Member of Clubs or Organizations: No    Attends Banker Meetings: Never    Marital Status: Divorced    Tobacco Counseling Counseling given: Not Answered    Clinical Intake:  Pre-visit preparation completed: Yes  Pain : No/denies pain     BMI - recorded: 30.96 Nutritional Status: BMI > 30  Obese Nutritional Risks: None Diabetes: No  Lab Results  Component Value Date   HGBA1C 5.9 07/18/2023   HGBA1C 5.7 02/18/2023  HGBA1C 6.3 06/06/2022     How often do you need to have someone help you when you read instructions, pamphlets, or other written materials from your doctor or pharmacy?: 1 - Never  Interpreter Needed?: No  Comments: lives alone Information entered by :: B.Tangala Wiegert,LPN   Activities of Daily Living     04/21/2024    3:11 PM 07/09/2023   12:00 AM  In your present state of health, do you have any difficulty performing the following activities:  Hearing? 0 0  Vision? 0 0  Difficulty concentrating or making decisions? 0 0  Walking or climbing stairs? 0 0  Dressing or bathing? 0 0  Doing errands, shopping? 0 1  Preparing Food and eating ? N   Using the Toilet? N   In the past six months, have you accidently leaked urine? N   Do you have problems with loss of bowel control? N   Managing your Medications? N   Managing your Finances? N   Housekeeping or managing your Housekeeping? N     Patient Care Team: Tower, Laine LABOR, MD as PCP - General Stacia Glendia BRAVO, MD as Consulting Physician (Gastroenterology) Mai Lynwood FALCON, MD as Consulting Physician (Rheumatology)  I have updated your Care Teams any recent Medical Services you may have received from other providers in the past year.     Assessment:   This is a routine wellness examination for Jennifer Rose.  Hearing/Vision screen Hearing Screening - Comments:: Pt says hearing is good  Vision Screening -  Comments:: Pt says her vision is good; glasses or reading Dr Fleeta   Goals Addressed             This Visit's Progress    Patient Stated       I would like to stay as healthy as I can, stay active and keep my sight.       Depression Screen     04/21/2024    3:03 PM 05/13/2023    9:00 AM 03/29/2023    9:28 AM 02/18/2023    8:11 AM 12/03/2022    8:33 AM 04/19/2022   10:07 AM 02/14/2022    8:39 AM  PHQ 2/9 Scores  PHQ - 2 Score 0 0 0 0 0 0 0  PHQ- 9 Score  0 0 0 0  0    Fall Risk     04/21/2024    2:49 PM 05/13/2023    9:00 AM 12/03/2022    8:33 AM 04/19/2022   10:07 AM  Fall Risk   Falls in the past year? 1 0 0 0  Number falls in past yr: 0 0 0   Injury with Fall? 0 0 0   Risk for fall due to : No Fall Risks No Fall Risks No Fall Risks   Follow up Falls prevention discussed;Education provided Falls evaluation completed Falls evaluation completed Falls evaluation completed      Data saved with a previous flowsheet row definition    MEDICARE RISK AT HOME:  Medicare Risk at Home Any stairs in or around the home?: Yes If so, are there any without handrails?: Yes Home free of loose throw rugs in walkways, pet beds, electrical cords, etc?: Yes Adequate lighting in your home to reduce risk of falls?: Yes Life alert?: No Use of a cane, walker or w/c?: No Grab bars in the bathroom?: Yes Shower chair or bench in shower?: No Elevated toilet seat or a handicapped toilet?: Yes  TIMED UP AND GO:  Was the test performed?  Yes  Length of time to ambulate 10 feet: 12 sec Gait steady and fast without use of assistive device  Cognitive Function: 6CIT completed      02/12/2024    8:47 AM 07/25/2023    8:07 AM  Montreal Cognitive Assessment   Visuospatial/ Executive (0/5) 5 4  Naming (0/3) 3 3  Attention: Read list of digits (0/2) 2 2  Attention: Read list of letters (0/1) 1 1  Attention: Serial 7 subtraction starting at 100 (0/3) 3 3  Language: Repeat phrase (0/2) 2 2  Language  : Fluency (0/1) 1 0  Abstraction (0/2) 2 2  Delayed Recall (0/5) 0 1  Orientation (0/6) 6 6  Total 25 24      04/21/2024    3:17 PM  6CIT Screen  What Year? 0 points  What month? 0 points  What time? 0 points  Count back from 20 0 points  Months in reverse 0 points  Repeat phrase 0 points  Total Score 0 points    Immunizations Immunization History  Administered Date(s) Administered   Fluad Quad(high Dose 65+) 08/03/2020   Fluad Trivalent(High Dose 65+) 07/18/2023   Influenza,inj,Quad PF,6+ Mos 07/14/2019, 08/01/2021   PFIZER(Purple Top)SARS-COV-2 Vaccination 01/06/2020, 01/27/2020, 06/10/2020, 01/30/2021   Pfizer Covid-19 Vaccine Bivalent Booster 57yrs & up 07/12/2021   Tdap 11/15/2020    Screening Tests Health Maintenance  Topic Date Due   FOOT EXAM  Never done   Diabetic kidney evaluation - Urine ACR  Never done   DEXA SCAN  03/23/2023   HEMOGLOBIN A1C  01/15/2024   Pneumococcal Vaccine: 50+ Years (1 of 2 - PCV) 07/17/2024 (Originally 03/22/1975)   INFLUENZA VACCINE  05/22/2024   OPHTHALMOLOGY EXAM  07/17/2024   MAMMOGRAM  09/01/2024   Diabetic kidney evaluation - eGFR measurement  02/25/2025   Medicare Annual Wellness (AWV)  04/21/2025   Colonoscopy  03/25/2030   DTaP/Tdap/Td (2 - Td or Tdap) 11/15/2030   Hepatitis C Screening  Completed   Hepatitis B Vaccines  Aged Out   HPV VACCINES  Aged Out   Meningococcal B Vaccine  Aged Out   COVID-19 Vaccine  Discontinued   Zoster Vaccines- Shingrix  Discontinued    Health Maintenance  Health Maintenance Due  Topic Date Due   FOOT EXAM  Never done   Diabetic kidney evaluation - Urine ACR  Never done   DEXA SCAN  03/23/2023   HEMOGLOBIN A1C  01/15/2024   Health Maintenance Items Addressed: None at this time Dexa to be done by specialist per pt  Additional Screening:  Vision Screening: Recommended annual ophthalmology exams for early detection of glaucoma and other disorders of the eye. Would you like a  referral to an eye doctor? No    Dental Screening: Recommended annual dental exams for proper oral hygiene  Community Resource Referral / Chronic Care Management: CRR required this visit?  No   CCM required this visit?  Appt scheduled with PCP   Plan:    I have personally reviewed and noted the following in the patient's chart:   Medical and social history Use of alcohol, tobacco or illicit drugs  Current medications and supplements including opioid prescriptions. Patient is not currently taking opioid prescriptions. Functional ability and status Nutritional status Physical activity Advanced directives List of other physicians Hospitalizations, surgeries, and ER visits in previous 12 months Vitals Screenings to include cognitive, depression, and falls Referrals and appointments  In addition, I have reviewed and discussed  with patient certain preventive protocols, quality metrics, and best practice recommendations. A written personalized care plan for preventive services as well as general preventive health recommendations were provided to patient.   Erminio LITTIE Saris, LPN   11/29/7972   After Visit Summary: (MyChart) Due to this being a telephonic visit, the after visit summary with patients personalized plan was offered to patient via MyChart   Notes: Nothing significant to report at this time.

## 2024-04-21 NOTE — Patient Instructions (Signed)
 Ms. Nesmith , Thank you for taking time out of your busy schedule to complete your Annual Wellness Visit with me. I enjoyed our conversation and look forward to speaking with you again next year. I, as well as your care team,  appreciate your ongoing commitment to your health goals. Please review the following plan we discussed and let me know if I can assist you in the future. Your Game plan/ To Do List    Follow up Visits: Next Medicare AWV with our clinical staff: 04/22/25   Have you seen your provider in the last 6 months (3 months if uncontrolled diabetes)? No Next Office Visit with your provider: 05/22/24 @ 9am  Clinician Recommendations:  Aim for 30 minutes of exercise or brisk walking, 6-8 glasses of water, and 5 servings of fruits and vegetables each day.       This is a list of the screening recommended for you and due dates:  Health Maintenance  Topic Date Due   Complete foot exam   Never done   Yearly kidney health urinalysis for diabetes  Never done   DEXA scan (bone density measurement)  03/23/2023   Hemoglobin A1C  01/15/2024   Pneumococcal Vaccine for age over 73 (1 of 2 - PCV) 07/17/2024*   Flu Shot  05/22/2024   Eye exam for diabetics  07/17/2024   Mammogram  09/01/2024   Yearly kidney function blood test for diabetes  02/25/2025   Medicare Annual Wellness Visit  04/21/2025   Colon Cancer Screening  03/25/2030   DTaP/Tdap/Td vaccine (2 - Td or Tdap) 11/15/2030   Hepatitis C Screening  Completed   Hepatitis B Vaccine  Aged Out   HPV Vaccine  Aged Out   Meningitis B Vaccine  Aged Out   COVID-19 Vaccine  Discontinued   Zoster (Shingles) Vaccine  Discontinued  *Topic was postponed. The date shown is not the original due date.    Advanced directives: (Provided) Advance directive discussed with you today. I have provided a copy for you to complete at home and have notarized. Once this is complete, please bring a copy in to our office so we can scan it into your chart.   Advance Care Planning is important because it:  [x]  Makes sure you receive the medical care that is consistent with your values, goals, and preferences  [x]  It provides guidance to your family and loved ones and reduces their decisional burden about whether or not they are making the right decisions based on your wishes.  Follow the link provided in your after visit summary or read over the paperwork we have mailed to you to help you started getting your Advance Directives in place. If you need assistance in completing these, please reach out to us  so that we can help you!

## 2024-05-22 ENCOUNTER — Encounter: Admitting: Family Medicine

## 2024-05-28 DIAGNOSIS — R808 Other proteinuria: Secondary | ICD-10-CM | POA: Diagnosis not present

## 2024-05-28 DIAGNOSIS — M6281 Muscle weakness (generalized): Secondary | ICD-10-CM | POA: Diagnosis not present

## 2024-05-28 DIAGNOSIS — I7300 Raynaud's syndrome without gangrene: Secondary | ICD-10-CM | POA: Diagnosis not present

## 2024-05-28 DIAGNOSIS — M329 Systemic lupus erythematosus, unspecified: Secondary | ICD-10-CM | POA: Diagnosis not present

## 2024-05-28 DIAGNOSIS — D696 Thrombocytopenia, unspecified: Secondary | ICD-10-CM | POA: Diagnosis not present

## 2024-05-28 DIAGNOSIS — R058 Other specified cough: Secondary | ICD-10-CM | POA: Diagnosis not present

## 2024-05-28 DIAGNOSIS — H4602 Optic papillitis, left eye: Secondary | ICD-10-CM | POA: Diagnosis not present

## 2024-05-28 DIAGNOSIS — K1121 Acute sialoadenitis: Secondary | ICD-10-CM | POA: Diagnosis not present

## 2024-05-28 DIAGNOSIS — G40909 Epilepsy, unspecified, not intractable, without status epilepticus: Secondary | ICD-10-CM | POA: Diagnosis not present

## 2024-06-03 ENCOUNTER — Ambulatory Visit (INDEPENDENT_AMBULATORY_CARE_PROVIDER_SITE_OTHER): Admitting: Family Medicine

## 2024-06-03 ENCOUNTER — Encounter: Payer: Self-pay | Admitting: Family Medicine

## 2024-06-03 VITALS — BP 136/70 | HR 69 | Temp 97.9°F | Ht 64.25 in | Wt 194.2 lb

## 2024-06-03 DIAGNOSIS — M3219 Other organ or system involvement in systemic lupus erythematosus: Secondary | ICD-10-CM

## 2024-06-03 DIAGNOSIS — Z79899 Other long term (current) drug therapy: Secondary | ICD-10-CM

## 2024-06-03 DIAGNOSIS — Z1211 Encounter for screening for malignant neoplasm of colon: Secondary | ICD-10-CM

## 2024-06-03 DIAGNOSIS — E66812 Obesity, class 2: Secondary | ICD-10-CM

## 2024-06-03 DIAGNOSIS — R7303 Prediabetes: Secondary | ICD-10-CM

## 2024-06-03 DIAGNOSIS — G8929 Other chronic pain: Secondary | ICD-10-CM

## 2024-06-03 DIAGNOSIS — Z6836 Body mass index (BMI) 36.0-36.9, adult: Secondary | ICD-10-CM

## 2024-06-03 DIAGNOSIS — N1831 Chronic kidney disease, stage 3a: Secondary | ICD-10-CM | POA: Diagnosis not present

## 2024-06-03 DIAGNOSIS — M25512 Pain in left shoulder: Secondary | ICD-10-CM

## 2024-06-03 DIAGNOSIS — I10 Essential (primary) hypertension: Secondary | ICD-10-CM

## 2024-06-03 DIAGNOSIS — D693 Immune thrombocytopenic purpura: Secondary | ICD-10-CM | POA: Diagnosis not present

## 2024-06-03 DIAGNOSIS — E661 Drug-induced obesity: Secondary | ICD-10-CM

## 2024-06-03 DIAGNOSIS — Z0000 Encounter for general adult medical examination without abnormal findings: Secondary | ICD-10-CM

## 2024-06-03 DIAGNOSIS — G3184 Mild cognitive impairment, so stated: Secondary | ICD-10-CM

## 2024-06-03 DIAGNOSIS — K2270 Barrett's esophagus without dysplasia: Secondary | ICD-10-CM

## 2024-06-03 MED ORDER — LOSARTAN POTASSIUM 100 MG PO TABS: 100.0000 mg | ORAL_TABLET | Freq: Every day | ORAL | 1 refills | Status: AC

## 2024-06-03 NOTE — Assessment & Plan Note (Signed)
 Continues neuro follow up

## 2024-06-03 NOTE — Progress Notes (Signed)
 Subjective:    Patient ID: Jennifer Rose, female    DOB: 08-02-1956, 68 y.o.   MRN: [id number]  HPI  Here for health maintenance exam and to review chronic medical problems   Wt Readings from Last 3 Encounters:  06/03/24 194 lb 4 oz (88.1 kg)  04/21/24 191 lb 12.8 oz (87 kg)  02/12/24 188 lb 12.8 oz (85.6 kg)   33.08 kg/m  Vitals:   06/03/24 1404 06/03/24 1438  BP: (!) 148/78 136/70  Pulse: 69   Temp: 97.9 F (36.6 C)   SpO2: 100%     Immunization History  Administered Date(s) Administered   Fluad Quad(high Dose 65+) 08/03/2020   Fluad Trivalent(High Dose 65+) 07/18/2023   Influenza,inj,Quad PF,6+ Mos 07/14/2019, 08/01/2021   PFIZER(Purple Top)SARS-COV-2 Vaccination 01/06/2020, 01/27/2020, 06/10/2020, 01/30/2021   Pfizer Covid-19 Vaccine Bivalent Booster 6yrs & up 07/12/2021   Tdap 11/15/2020    Health Maintenance Due  Topic Date Due   FOOT EXAM  Never done   Diabetic kidney evaluation - Urine ACR  Never done   DEXA SCAN  03/23/2023   HEMOGLOBIN A1C  01/15/2024     Mammogram 08/2023  Self breast exam-no lumps   Gyn health Has gyn provider  Dr Estelle Last pap was ok     Colon cancer screening -colonoscopy 03/2020 with 5 y recall   Bone health  Dexa    3 y ago with Dr Mai (has one in his office)  Falls- fell in December (when she first started keppra  before dose was changed) -fell on stairs and landed on left shoulder  Fractures-none  Supplements -vitamin D   Last vitamin D  Lab Results  Component Value Date   VD25OH 32.83 02/18/2023    Exercise  Wants to get back to walking when not too wet  Uses some weights indoors     Mood    06/03/2024    2:14 PM 04/21/2024    3:03 PM 05/13/2023    9:00 AM 03/29/2023    9:28 AM 02/18/2023    8:11 AM  Depression screen PHQ 2/9  Decreased Interest 0 0 0 0 0  Down, Depressed, Hopeless 0 0 0 0 0  PHQ - 2 Score 0 0 0 0 0  Altered sleeping 0  0 0 0  Tired, decreased energy 0  0 0 0  Change in  appetite 0  0 0 0  Feeling bad or failure about yourself  0  0 0 0  Trouble concentrating 0  0 0 0  Moving slowly or fidgety/restless 0  0 0 0  Suicidal thoughts 0  0 0 0  PHQ-9 Score 0  0 0 0  Difficult doing work/chores Not difficult at all  Not difficult at all Not difficult at all Not difficult at all   Mood is good  Her sister is moving here into grandparent's house -is a little stressful (it is a change)    Sees neuro for  [facility] -likely due to vascular cause/small vessel (? 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- considering biopsy  Has protein in urine  Due for labs  Lab Results  Component Value Date   NA 140 07/18/2023   K 4.1 07/18/2023   CO2 24 07/18/2023   GLUCOSE 89 07/18/2023   BUN 20 07/18/2023   CREATININE 1.27 (H) 07/18/2023   CALCIUM 9.6 07/18/2023   GFR 44.44 (L) 07/18/2023   EGFR 47.0 02/26/2024   GFRNONAA 46 (L) 07/09/2023   Prediabetes Lab Results  Component Value Date   HGBA1C 5.9 07/18/2023   HGBA1C 5.7 02/18/2023   HGBA1C 6.3 06/06/2022   GERD Omeprzole 20 mg daily   SLE with low plt ct in past Relatively stable  Lab Results  Component Value Date   WBC 5.5 07/18/2023   HGB 12.7 07/18/2023   HCT 39.6 07/18/2023   MCV 80.6 07/18/2023   PLT 97.0 (L) 07/18/2023   Plaquenil  -decreased due to a rash Benlysta -newer      Patient Active Problem List   Diagnosis Date Noted   Left shoulder pain 06/03/2024   Current use of proton pump inhibitor 06/03/2024    Confusion 07/25/2023   Chronic migraine w/o aura w/o status migrainosus, not intractable 07/25/2023   Cerebral vascular disease 07/25/2023   Labile blood pressure 07/18/2023   History of encephalopathy 07/18/2023   Hypertensive urgency 07/08/2023   History of memory loss 07/08/2023   Chronic idiopathic thrombocytopenia (HCC) 07/08/2023   CKD (chronic kidney disease) stage 3, GFR 30-59 ml/min (HCC) 07/08/2023   Contact dermatitis 05/13/2023   Rash of neck 03/29/2023   Short-term memory loss 02/18/2023   Mild cognitive impairment 02/18/2023   Peptic duodenitis 09/17/2022   Barrett's esophagus 09/17/2022   LPRD (laryngopharyngeal reflux disease) 07/03/2022   Thornwaldt's cyst 07/03/2022   Hiccups 01/30/2022   Colon cancer screening 11/12/2019   H/O migraine 08/26/2018   Routine general medical examination at a health care facility 08/26/2018   Hypokalemia 08/26/2018   Thrombocytopenia (HCC) 04/30/2017   Prediabetes 12/13/2014   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 36.9 in adult 12/13/2014   GLAUCOMA 04/15/2009   Systemic lupus erythematosus (HCC) 11/09/2008   Allergic rhinitis 09/24/2007   Essential hypertension 03/22/2004   Past Medical History:  Diagnosis Date   Allergy    allergic rhinitis   Eye pressure    GERD (gastroesophageal reflux disease)    Hypertension 03/22/2004   Lupus    Uterine fibroid 08/22/2000   per GYN   Past Surgical History:  Procedure Laterality Date   BREAST CYST EXCISION Right 36 yrs ago   x2   CESAREAN SECTION     x 3   CHOLECYSTECTOMY     ENDOMETRIAL ABLATION  01/2002   polyps?DUB   Social History   Tobacco Use   Smoking status: Never   Smokeless tobacco: Never  Vaping Use   Vaping status: Never Used  Substance Use Topics   Alcohol use: No    Alcohol/week: 0.0 standard drinks of alcohol   Drug use: No   Family History  Problem Relation Age of Onset   Blindness Mother     Stroke Mother    Diabetes Mother    Glaucoma Mother    Hypertension Mother    Glaucoma Father    Diabetes Father    Hypertension Father    Prostate cancer Father    Stroke Sister  Diabetes Sister    Glaucoma Maternal Grandmother    Diabetes Maternal Grandmother    Hypertension Maternal Grandmother    Colon cancer Maternal Grandmother    Heart attack Maternal Grandfather    Glaucoma Paternal Grandmother    Heart disease Paternal Grandmother    Colitis Daughter    Hypertension Daughter    Diabetes Daughter    Allergies  Allergen Reactions   Shrimp Extract Shortness Of Breath    Throat swelling,   Guaifenesin Other (See Comments)    hiccups   Mucinex Clear & Cool Day-Night     Hiccups    Current Outpatient Medications on File Prior to Visit  Medication Sig Dispense Refill   amLODipine  (NORVASC ) 5 MG tablet TAKE ONE TABLET BY MOUTH ONCE A DAY 90 tablet 1   BENLYSTA 200 MG/ML SOAJ Inject as directed once a week.     Blood Pressure KIT Check BP every morning and evening and keep a log 1 kit 0   cholecalciferol (VITAMIN D ) 1000 UNITS tablet Take 2,000 Units by mouth daily.     fexofenadine  (ALLEGRA ) 180 MG tablet Take 180 mg by mouth daily as needed for allergies or rhinitis.     fluticasone  (FLONASE ) 50 MCG/ACT nasal spray USE 1 TO 2 SPRAYS IN EACH NOSTRIL DAILY 16 g 5   hydrocortisone  2.5 % cream Apply topically 2 (two) times daily as needed (Rash). 90 g 3   hydroxychloroquine  (PLAQUENIL ) 200 MG tablet Take 200 mg by mouth 2 (two) times daily. (Patient taking differently: Take 200 mg by mouth daily.)     latanoprost (XALATAN) 0.005 % ophthalmic solution Place 1 drop into both eyes daily.     levETIRAcetam  (KEPPRA  XR) 500 MG 24 hr tablet Take 2 tablets (1,000 mg total) by mouth at bedtime. 60 tablet 5   meclizine (ANTIVERT) 12.5 MG tablet Take 12.5 mg by mouth 3 (three) times daily as needed.     omeprazole  (PRILOSEC) 20 MG capsule TAKE ONE CAPSULE (20 MG TOTAL) BY MOUTH TWO  TIMES DAILY BEFORE A MEAL. 180 capsule 0   potassium chloride  (KLOR-CON ) 10 MEQ tablet Take 2 tablets (20 mEq total) by mouth daily. 180 tablet 3   Rimegepant Sulfate (NURTEC) 75 MG TBDP Take 1 tablet (75 mg total) by mouth as needed. 16 tablet 11   Safety Seal Miscellaneous MISC Apply 1 application  topically in the morning and at bedtime. Apply to face and neck 30 g 2   tacrolimus  (PROTOPIC ) 0.1 % ointment Apply topically 2 (two) times daily. Apply to affected areas twice daily for 2 weeks.  Alternate with Clobetasol  cream on areas on body 100 g 2   No current facility-administered medications on file prior to visit.    Review of Systems  Constitutional:  Positive for fatigue. Negative for activity change, appetite change, fever and unexpected weight change.  HENT:  Negative for congestion, ear pain, rhinorrhea, sinus pressure and sore throat.   Eyes:  Negative for pain, redness and visual disturbance.  Respiratory:  Negative for cough, shortness of breath and wheezing.        Occational chest discomfort  Thinks from acid reflux   Cardiovascular:  Negative for chest pain and palpitations.  Gastrointestinal:  Negative for abdominal pain, blood in stool, constipation and diarrhea.  Endocrine: Negative for polydipsia and polyuria.  Genitourinary:  Negative for dysuria, frequency and urgency.  Musculoskeletal:  Positive for arthralgias. Negative for back pain and myalgias.  Skin:  Negative for pallor and rash.  Allergic/Immunologic: Negative for environmental allergies.  Neurological:  Negative for dizziness, syncope and headaches.  Hematological:  Negative for adenopathy. Does not bruise/bleed easily.  Psychiatric/Behavioral:  Positive for decreased concentration. Negative for confusion and dysphoric mood. The patient is not nervous/anxious.        Objective:   Physical Exam Constitutional:      General: She is not in acute distress.    Appearance: Normal appearance. She is  well-developed. She is obese. She is not ill-appearing or diaphoretic.  HENT:     Head: Normocephalic and atraumatic.     Right Ear: Tympanic membrane, ear canal and external ear normal.     Left Ear: Tympanic membrane, ear canal and external ear normal.     Nose: Nose normal. No congestion.     Mouth/Throat:     Mouth: Mucous membranes are moist.     Pharynx: Oropharynx is clear. No posterior oropharyngeal erythema.  Eyes:     General: No scleral icterus.    Extraocular Movements: Extraocular movements intact.     Conjunctiva/sclera: Conjunctivae normal.     Pupils: Pupils are equal, round, and reactive to light.  Neck:     Thyroid : No thyromegaly.     Vascular: No carotid bruit or JVD.  Cardiovascular:     Rate and Rhythm: Normal rate and regular rhythm.     Pulses: Normal pulses.     Heart sounds: Normal heart sounds.     No gallop.  Pulmonary:     Effort: Pulmonary effort is normal. No respiratory distress.     Breath sounds: Normal breath sounds. No wheezing.     Comments: Good air exch Chest:     Chest wall: No tenderness.  Abdominal:     General: Bowel sounds are normal. There is no distension or abdominal bruit.     Palpations: Abdomen is soft. There is no mass.     Tenderness: There is no abdominal tenderness.     Hernia: No hernia is present.  Genitourinary:    Comments: Breast exam: No mass, nodules, thickening, tenderness, bulging, retraction, inflamation, nipple discharge or skin changes noted.  No axillary or clavicular LA.     Musculoskeletal:        General: No tenderness. Normal range of motion.     Cervical back: Normal range of motion and neck supple. No rigidity. No muscular tenderness.     Right lower leg: No edema.     Left lower leg: No edema.     Comments: No kyphosis   Cannot fully abduct left shoulder   Lymphadenopathy:     Cervical: No cervical adenopathy.  Skin:    General: Skin is warm and dry.     Coloration: Skin is not pale.      Findings: No erythema or rash.  Neurological:     Mental Status: She is alert. Mental status is at baseline.     Cranial Nerves: No cranial nerve deficit.     Motor: No abnormal muscle tone.     Coordination: Coordination normal.     Gait: Gait normal.     Deep Tendon Reflexes: Reflexes are normal and symmetric. Reflexes normal.  Psychiatric:        Attention and Perception: Attention normal.        Mood and Affect: Mood normal.        Cognition and Memory: Cognition and memory normal.     Comments: Mentally sharp today  Assessment & Plan:   Problem List Items Addressed This Visit       Cardiovascular and Mediastinum   Essential hypertension   bp in fair control at this time  BP Readings from Last 1 Encounters:  06/03/24 136/70   No changes needed Most recent labs reviewed  Disc lifstyle change with low sodium diet and exercise  Supposed to be on losartan  100 instead of 50, sent to pharm  Amlodpine 5 mg daily  Continues nephrology care       Relevant Medications   losartan  (COZAAR ) 100 MG tablet   Other Relevant Orders   CBC with Differential/Platelet   Comprehensive metabolic panel with GFR   TSH   Lipid Panel     Digestive   Barrett's esophagus   More chest discomfort lately despite ppi  Will follow up with GI         Musculoskeletal and Integument   Chronic idiopathic thrombocytopenia (HCC)   In setting of SLE Lab today  No new bleeding/bruising       Relevant Orders   CBC with Differential/Platelet     Genitourinary   CKD (chronic kidney disease) stage 3, GFR 30-59 ml/min (HCC)   Continues nephrology follow up  From HTN and SLE  With proteinuria  Per pt losartan  was increase to 100 mg but her doctor forgot to send Sent today  Will monitor blood pressure   Planning for renal bx  Labs today      Relevant Orders   Comprehensive metabolic panel with GFR     Other   Systemic lupus erythematosus (HCC)   Fairly stable Seeing  nephrology as well as rheumatology  Plaquenil  was decreased Taking benlysta       Routine general medical examination at a health care facility - Primary   Reviewed health habits including diet and exercise and skin cancer prevention Reviewed appropriate screening tests for age  Also reviewed health mt list, fam hx and immunization status , as well as social and family history   See HPI Labs reviewed and ordered Health Maintenance  Topic Date Due   Complete foot exam   Never done   Yearly kidney health urinalysis for diabetes  Never done   DEXA scan (bone density measurement)  03/23/2023   Hemoglobin A1C  01/15/2024   Pneumococcal Vaccine for age over 20 (1 of 2 - PCV) 07/17/2024*   Flu Shot  01/19/2025*   Eye exam for diabetics  07/17/2024   Mammogram  09/01/2024   Yearly kidney function blood test for diabetes  02/25/2025   Medicare Annual Wellness Visit  04/21/2025   Colon Cancer Screening  03/25/2030   DTaP/Tdap/Td vaccine (2 - Td or Tdap) 11/15/2030   Hepatitis C Screening  Completed   Hepatitis B Vaccine  Aged Out   HPV Vaccine  Aged Out   Meningitis B Vaccine  Aged Out   COVID-19 Vaccine  Discontinued   Zoster (Shingles) Vaccine  Discontinued  *Topic was postponed. The date shown is not the original due date.    Mammo due in nov  Colonoscopy due in a y  Discussed fall prevention, supplements and exercise for bone density   Will get dexa with rheumatologist when due  Utd gyn care  PHQ 0  Following with neuro for [facility]       Prediabetes   A1c ordered today  disc imp of low glycemic diet and wt loss to prevent DM2       Relevant Orders  Hemoglobin A1c   Mild cognitive impairment   Continues neuro follow up       Left shoulder pain   From fall in dec  Trouble abducting fully due to pain  Planning follow up with [facility]       Current use of proton pump inhibitor   B12 added to labs Encouraged to take vit D for bone health      Relevant Orders    Vitamin B12   Colon cancer screening   Colonoscop  03/2020 with 5 y recall      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 36.9 in adult   Discussed how this problem influences overall health and the risks it imposes  Reviewed plan for weight loss with lower calorie diet (via better food choices (lower glycemic and portion control) along with exercise building up to or more than 30 minutes 5 days per week including some aerobic activity and strength training

## 2024-06-03 NOTE — Assessment & Plan Note (Signed)
 A1c ordered today  disc imp of low glycemic diet and wt loss to prevent DM2

## 2024-06-03 NOTE — Assessment & Plan Note (Signed)
 Discussed how this problem influences overall health and the risks it imposes  Reviewed plan for weight loss with lower calorie diet (via better food choices (lower glycemic and portion control) along with exercise building up to or more than 30 minutes 5 days per week including some aerobic activity and strength training

## 2024-06-03 NOTE — Assessment & Plan Note (Signed)
 More chest discomfort lately despite ppi  Will follow up with GI

## 2024-06-03 NOTE — Assessment & Plan Note (Signed)
 bp in fair control at this time  BP Readings from Last 1 Encounters:  06/03/24 136/70   No changes needed Most recent labs reviewed  Disc lifstyle change with low sodium diet and exercise  Supposed to be on losartan  100 instead of 50, sent to pharm  Amlodpine 5 mg daily  Continues nephrology care

## 2024-06-03 NOTE — Assessment & Plan Note (Signed)
 Colonoscop  03/2020 with 5 y recall

## 2024-06-03 NOTE — Assessment & Plan Note (Signed)
 Continues nephrology follow up  From HTN and SLE  With proteinuria  Per pt losartan  was increase to 100 mg but her doctor forgot to send Sent today  Will monitor blood pressure   Planning for renal bx  Labs today

## 2024-06-03 NOTE — Assessment & Plan Note (Signed)
 Reviewed health habits including diet and exercise and skin cancer prevention Reviewed appropriate screening tests for age  Also reviewed health mt list, fam hx and immunization status , as well as social and family history   See HPI Labs reviewed and ordered Health Maintenance  Topic Date Due   Complete foot exam   Never done   Yearly kidney health urinalysis for diabetes  Never done   DEXA scan (bone density measurement)  03/23/2023   Hemoglobin A1C  01/15/2024   Pneumococcal Vaccine for age over 59 (1 of 2 - PCV) 07/17/2024*   Flu Shot  01/19/2025*   Eye exam for diabetics  07/17/2024   Mammogram  09/01/2024   Yearly kidney function blood test for diabetes  02/25/2025   Medicare Annual Wellness Visit  04/21/2025   Colon Cancer Screening  03/25/2030   DTaP/Tdap/Td vaccine (2 - Td or Tdap) 11/15/2030   Hepatitis C Screening  Completed   Hepatitis B Vaccine  Aged Out   HPV Vaccine  Aged Out   Meningitis B Vaccine  Aged Out   COVID-19 Vaccine  Discontinued   Zoster (Shingles) Vaccine  Discontinued  *Topic was postponed. The date shown is not the original due date.    Mammo due in nov  Colonoscopy due in a y  Discussed fall prevention, supplements and exercise for bone density   Will get dexa with rheumatologist when due  Utd gyn care  PHQ 0  Following with neuro for [facility]

## 2024-06-03 NOTE — Assessment & Plan Note (Signed)
 Fairly stable Seeing nephrology as well as rheumatology  Plaquenil  was decreased Taking benlysta

## 2024-06-03 NOTE — Assessment & Plan Note (Signed)
 In setting of SLE Lab today  No new bleeding/bruising

## 2024-06-03 NOTE — Assessment & Plan Note (Signed)
 From fall in dec  Trouble abducting fully due to pain  Planning follow up with [facility]

## 2024-06-03 NOTE — Assessment & Plan Note (Signed)
 B12 added to labs Encouraged to take vit D for bone health

## 2024-06-03 NOTE — Patient Instructions (Addendum)
 Your colonoscopy will be due next June for 5 year recall   Follow up with [facility] for left shoulder pain   Stay active  Add some strength training to your routine, this is important for bone and brain health and can reduce your risk of falls and help your body use insulin  properly and regulate weight  Light weights, exercise bands , and internet videos are a good way to start  Yoga (chair or regular), machines , floor exercises or a gym with machines are also good options    Chair exercise is great - avoid left arm until it gets checked out   Keep getting bone density tests with Dr Mai    Lab today   Get a GI follow up for symptoms   Follow up here when convenient for chest symptoms  If any severe chest pain -go to the ER

## 2024-06-04 ENCOUNTER — Ambulatory Visit: Payer: Self-pay | Admitting: Family Medicine

## 2024-06-04 LAB — CBC WITH DIFFERENTIAL/PLATELET
Basophils Absolute: 0 K/uL (ref 0.0–0.1)
Basophils Relative: 0.7 % (ref 0.0–3.0)
Eosinophils Absolute: 0.1 K/uL (ref 0.0–0.7)
Eosinophils Relative: 1.8 % (ref 0.0–5.0)
HCT: 37.1 % (ref 36.0–46.0)
Hemoglobin: 12.2 g/dL (ref 12.0–15.0)
Lymphocytes Relative: 12.4 % (ref 12.0–46.0)
Lymphs Abs: 0.6 K/uL — ABNORMAL LOW (ref 0.7–4.0)
MCHC: 32.9 g/dL (ref 30.0–36.0)
MCV: 78.7 fl (ref 78.0–100.0)
Monocytes Absolute: 0.5 K/uL (ref 0.1–1.0)
Monocytes Relative: 11.2 % (ref 3.0–12.0)
Neutro Abs: 3.3 K/uL (ref 1.4–7.7)
Neutrophils Relative %: 73.9 % (ref 43.0–77.0)
Platelets: 92 K/uL — ABNORMAL LOW (ref 150.0–400.0)
RBC: 4.72 Mil/uL (ref 3.87–5.11)
RDW: 12.6 % (ref 11.5–15.5)
WBC: 4.5 K/uL (ref 4.0–10.5)

## 2024-06-04 LAB — LIPID PANEL
Cholesterol: 158 mg/dL (ref 0–200)
HDL: 51.1 mg/dL (ref >39.00)
LDL Cholesterol: 87 mg/dL (ref 0–99)
NonHDL: 106.52
Total CHOL/HDL Ratio: 3
Triglycerides: 97 mg/dL (ref 0.0–149.0)
VLDL: 19.4 mg/dL (ref 0.0–40.0)

## 2024-06-04 LAB — COMPREHENSIVE METABOLIC PANEL WITH GFR
ALT: 25 U/L (ref 0–35)
AST: 22 U/L (ref 0–37)
Albumin: 4 g/dL (ref 3.5–5.2)
Alkaline Phosphatase: 116 U/L (ref 39–117)
BUN: 21 mg/dL (ref 6–23)
CO2: 27 meq/L (ref 19–32)
Calcium: 9 mg/dL (ref 8.4–10.5)
Chloride: 105 meq/L (ref 96–112)
Creatinine, Ser: 1.29 mg/dL — ABNORMAL HIGH (ref 0.40–1.20)
GFR: 43.34 mL/min — ABNORMAL LOW (ref >60.00)
Glucose, Bld: 80 mg/dL (ref 70–99)
Potassium: 4.2 meq/L (ref 3.5–5.1)
Sodium: 140 meq/L (ref 135–145)
Total Bilirubin: 0.3 mg/dL (ref 0.2–1.2)
Total Protein: 6.8 g/dL (ref 6.0–8.3)

## 2024-06-04 LAB — TSH: TSH: 2.45 u[IU]/mL (ref 0.35–5.50)

## 2024-06-04 LAB — VITAMIN B12: Vitamin B-12: 1132 pg/mL — ABNORMAL HIGH (ref 211–911)

## 2024-06-04 LAB — HEMOGLOBIN A1C: Hgb A1c MFr Bld: 6 % (ref 4.6–6.5)

## 2024-06-17 ENCOUNTER — Ambulatory Visit: Admitting: Family Medicine

## 2024-06-17 ENCOUNTER — Ambulatory Visit (INDEPENDENT_AMBULATORY_CARE_PROVIDER_SITE_OTHER)
Admission: RE | Admit: 2024-06-17 | Discharge: 2024-06-17 | Disposition: A | Discharge status: Home / Self Care | Source: Ambulatory Visit | Attending: Family Medicine | Admitting: Family Medicine

## 2024-06-17 ENCOUNTER — Encounter: Payer: Self-pay | Admitting: Family Medicine

## 2024-06-17 ENCOUNTER — Ambulatory Visit: Payer: Self-pay | Admitting: Family Medicine

## 2024-06-17 VITALS — BP 123/60 | HR 66 | Temp 97.7°F | Ht 64.25 in | Wt 192.4 lb

## 2024-06-17 DIAGNOSIS — K2270 Barrett's esophagus without dysplasia: Secondary | ICD-10-CM | POA: Diagnosis not present

## 2024-06-17 DIAGNOSIS — R0789 Other chest pain: Secondary | ICD-10-CM

## 2024-06-17 DIAGNOSIS — N1832 Chronic kidney disease, stage 3b: Secondary | ICD-10-CM | POA: Diagnosis not present

## 2024-06-17 DIAGNOSIS — R9431 Abnormal electrocardiogram [ECG] [EKG]: Secondary | ICD-10-CM | POA: Diagnosis not present

## 2024-06-17 DIAGNOSIS — N1831 Chronic kidney disease, stage 3a: Secondary | ICD-10-CM

## 2024-06-17 DIAGNOSIS — I10 Essential (primary) hypertension: Secondary | ICD-10-CM | POA: Diagnosis not present

## 2024-06-17 DIAGNOSIS — M25512 Pain in left shoulder: Secondary | ICD-10-CM | POA: Diagnosis not present

## 2024-06-17 NOTE — Assessment & Plan Note (Signed)
 bp in fair control at this time  BP Readings from Last 1 Encounters:  06/17/24 123/60   No changes needed Most recent labs reviewed  Disc lifstyle change with low sodium diet and exercise  Supposed to be on losartan  100 instead of 50, sent to pharm  Amlodpine 5 mg daily  Continues nephrology care

## 2024-06-17 NOTE — Assessment & Plan Note (Signed)
 Per pt =has not been symptomatic lately  Avoids triggers Continues omeprazole  20 mg bid   Unsure if recent chest fullness /symptom may be related

## 2024-06-17 NOTE — Patient Instructions (Signed)
 Chest xray today   I may refer you to cardiology to make sure your chest discomfort is not cardiac in nature In [id number] [facility]    We will reach out with results and a plan   In the meantime if your chest discomfort worsens or you get short of breath -go to the ER

## 2024-06-17 NOTE — Progress Notes (Signed)
 Subjective:    Patient ID: Jennifer Rose, female    DOB: June 29, 1956, 68 y.o.   MRN: [id number]  HPI  Wt Readings from Last 3 Encounters:  06/17/24 192 lb 6 oz (87.3 kg)  06/03/24 194 lb 4 oz (88.1 kg)  04/21/24 191 lb 12.8 oz (87 kg)   32.76 kg/m  Vitals:   06/17/24 0800 06/17/24 0832  BP: (!) 142/70 123/60  Pulse: 66   Temp: 97.7 F (36.5 C)   SpO2: 100%     Pt presents for follow up of  Chest discomfort HTN  Has a little discomfort in her upper chest  Kind of a full feeling  Notes when she coughs  Notes when bending down  Almost sore- like sore muscles Not tender to the touch  Fleeting- few seconds   Not exertional at all   No shortness of breath  No wheezing No shortness of breath at other times   No trauma   Normal cxr a year ago   Has a little cough at night- she thinks from her GERD  Family notes she coughs a bit at night  No trouble swallowing    History of barretts esophagus and LPRD and peptic duodenitis  Takes omeprazole  20 mg bid   EGD 08/2022  4 cm HH Duodenal mucosal atrophy  Plan to repeat 3 years  Dr Stacia   Had 2 d echo 08/2023  EF 55-60% Grade 1 DD Normal valves   EKG today  NSR 67  Short PR interval  No ST or T wave changes   HTN bp is stable today  No cp or palpitations or headaches or edema  No side effects to medicines  BP Readings from Last 3 Encounters:  06/17/24 123/60  06/03/24 136/70  04/21/24 118/82    Amlodipine  5 mg daily  Losartan  100 mg daily    Lab Results  Component Value Date   NA 140 06/03/2024   K 4.2 06/03/2024   CO2 27 06/03/2024   GLUCOSE 80 06/03/2024   BUN 21 06/03/2024   CREATININE 1.29 (H) 06/03/2024   CALCIUM 9.0 06/03/2024   GFR 43.34 (L) 06/03/2024   EGFR 47.0 02/26/2024   GFRNONAA 46 (L) 07/09/2023    Lab Results  Component Value Date   CHOL 158 06/03/2024   HDL 51.10 06/03/2024   LDLCALC 87 06/03/2024   TRIG 97.0 06/03/2024   CHOLHDL 3 06/03/2024   Lab  Results  Component Value Date   HGBA1C 6.0 06/03/2024   HGBA1C 5.9 07/18/2023   HGBA1C 5.7 02/18/2023   Cxr today DG Chest 2 View Result Date: 06/17/2024 CLINICAL DATA:  Chest discomfort. EXAM: CHEST - 2 VIEW COMPARISON:  07/08/2023 FINDINGS: Both lungs are clear. Heart and mediastinum are within normal limits. Evidence for cholecystectomy clips in the right upper quadrant of the abdomen. Trachea is midline. No large pleural effusions. No acute bone abnormality. Negative for a pneumothorax. IMPRESSION: No acute cardiopulmonary disease. Electronically Signed   By: Juliene Balder M.D.   On: 06/17/2024 09:12     Patient Active Problem List   Diagnosis Date Noted   Chest discomfort 06/17/2024   Shortened PR interval 06/17/2024   Left shoulder pain 06/03/2024   Current use of proton pump inhibitor 06/03/2024   Confusion 07/25/2023   Chronic migraine w/o aura w/o status migrainosus, not intractable 07/25/2023   Cerebral vascular disease 07/25/2023   Labile blood pressure 07/18/2023   History of encephalopathy 07/18/2023   Hypertensive urgency 07/08/2023  History of memory loss 07/08/2023   Chronic idiopathic thrombocytopenia (HCC) 07/08/2023   CKD (chronic kidney disease) stage 3, GFR 30-59 ml/min (HCC) 07/08/2023   Contact dermatitis 05/13/2023   Rash of neck 03/29/2023   Short-term memory loss 02/18/2023   Mild cognitive impairment 02/18/2023   Peptic duodenitis 09/17/2022   Barrett's esophagus 09/17/2022   LPRD (laryngopharyngeal reflux disease) 07/03/2022   Thornwaldt's cyst 07/03/2022   Hiccups 01/30/2022   Colon cancer screening 11/12/2019   H/O migraine 08/26/2018   Routine general medical examination at a health care facility 08/26/2018   Hypokalemia 08/26/2018   Thrombocytopenia (HCC) 04/30/2017   Prediabetes 12/13/2014   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 36.9 in adult 12/13/2014   GLAUCOMA 04/15/2009   Systemic lupus  erythematosus (HCC) 11/09/2008   Allergic rhinitis 09/24/2007   Essential hypertension 03/22/2004   Past Medical History:  Diagnosis Date   Allergy    allergic rhinitis   Eye pressure    GERD (gastroesophageal reflux disease)    Hypertension 03/22/2004   Lupus    Uterine fibroid 08/22/2000   per GYN   Past Surgical History:  Procedure Laterality Date   BREAST CYST EXCISION Right 36 yrs ago   x2   CESAREAN SECTION     x 3   CHOLECYSTECTOMY     ENDOMETRIAL ABLATION  01/2002   polyps?DUB   Social History   Tobacco Use   Smoking status: Never   Smokeless tobacco: Never  Vaping Use   Vaping status: Never Used  Substance Use Topics   Alcohol use: No    Alcohol/week: 0.0 standard drinks of alcohol   Drug use: No   Family History  Problem Relation Age of Onset   Blindness Mother    Stroke Mother    Diabetes Mother    Glaucoma Mother    Hypertension Mother    Glaucoma Father    Diabetes Father    Hypertension Father    Prostate cancer Father    Stroke Sister    Diabetes Sister    Glaucoma Maternal Grandmother    Diabetes Maternal Grandmother    Hypertension Maternal Grandmother    Colon cancer Maternal Grandmother    Heart attack Maternal Grandfather    Glaucoma Paternal Grandmother    Heart disease Paternal Grandmother    Colitis Daughter    Hypertension Daughter    Diabetes Daughter    Allergies  Allergen Reactions   Shrimp Extract Shortness Of Breath    Throat swelling,   Guaifenesin Other (See Comments)    hiccups   Mucinex Clear & Cool Day-Night     Hiccups    Current Outpatient Medications on File Prior to Visit  Medication Sig Dispense Refill   amLODipine  (NORVASC ) 5 MG tablet TAKE ONE TABLET BY MOUTH ONCE A DAY 90 tablet 1   BENLYSTA 200 MG/ML SOAJ Inject as directed once a week.     Blood Pressure KIT Check BP every morning and evening and keep a log 1 kit 0   cholecalciferol (VITAMIN D ) 1000 UNITS tablet Take 2,000 Units by mouth daily.      fexofenadine  (ALLEGRA ) 180 MG tablet Take 180 mg by mouth daily as needed for allergies or rhinitis.     fluticasone  (FLONASE ) 50 MCG/ACT nasal spray USE 1 TO 2 SPRAYS IN EACH NOSTRIL DAILY 16 g 5   hydrocortisone  2.5 % cream Apply topically 2 (two) times daily as needed (Rash). 90 g  3   hydroxychloroquine  (PLAQUENIL ) 200 MG tablet Take 200 mg by mouth 2 (two) times daily. (Patient taking differently: Take 200 mg by mouth daily.)     latanoprost (XALATAN) 0.005 % ophthalmic solution Place 1 drop into both eyes daily.     levETIRAcetam  (KEPPRA  XR) 500 MG 24 hr tablet Take 2 tablets (1,000 mg total) by mouth at bedtime. 60 tablet 5   losartan  (COZAAR ) 100 MG tablet Take 1 tablet (100 mg total) by mouth daily. 90 tablet 1   meclizine (ANTIVERT) 12.5 MG tablet Take 12.5 mg by mouth 3 (three) times daily as needed.     omeprazole  (PRILOSEC) 20 MG capsule TAKE ONE CAPSULE (20 MG TOTAL) BY MOUTH TWO TIMES DAILY BEFORE A MEAL. 180 capsule 0   potassium chloride  (KLOR-CON ) 10 MEQ tablet Take 2 tablets (20 mEq total) by mouth daily. 180 tablet 3   Rimegepant Sulfate (NURTEC) 75 MG TBDP Take 1 tablet (75 mg total) by mouth as needed. 16 tablet 11   Safety Seal Miscellaneous MISC Apply 1 application  topically in the morning and at bedtime. Apply to face and neck 30 g 2   tacrolimus  (PROTOPIC ) 0.1 % ointment Apply topically 2 (two) times daily. Apply to affected areas twice daily for 2 weeks.  Alternate with Clobetasol  cream on areas on body 100 g 2   No current facility-administered medications on file prior to visit.    Review of Systems  Constitutional:  Negative for activity change, appetite change, fatigue, fever and unexpected weight change.  HENT:  Negative for congestion, ear pain, rhinorrhea, sinus pressure, sore throat, trouble swallowing and voice change.   Eyes:  Negative for pain, redness and visual disturbance.  Respiratory:  Negative for cough, shortness of breath and wheezing.    Cardiovascular:  Negative for chest pain, palpitations and leg swelling.       Chest discomfort-full feeling /not pain   Gastrointestinal:  Negative for abdominal pain, blood in stool, constipation and diarrhea.  Endocrine: Negative for polydipsia and polyuria.  Genitourinary:  Negative for dysuria, frequency and urgency.  Musculoskeletal:  Positive for arthralgias. Negative for back pain and myalgias.  Skin:  Negative for pallor and rash.  Allergic/Immunologic: Negative for environmental allergies.  Neurological:  Negative for dizziness, syncope and headaches.  Hematological:  Negative for adenopathy. Does not bruise/bleed easily.  Psychiatric/Behavioral:  Negative for decreased concentration and dysphoric mood. The patient is not nervous/anxious.        Objective:   Physical Exam Constitutional:      General: She is not in acute distress.    Appearance: Normal appearance. She is well-developed. She is obese. She is not ill-appearing or diaphoretic.  HENT:     Head: Normocephalic and atraumatic.     Mouth/Throat:     Mouth: Mucous membranes are moist.  Eyes:     General: No scleral icterus.    Conjunctiva/sclera: Conjunctivae normal.     Pupils: Pupils are equal, round, and reactive to light.  Neck:     Thyroid : No thyromegaly.     Vascular: No carotid bruit or JVD.  Cardiovascular:     Rate and Rhythm: Normal rate and regular rhythm.     Heart sounds: Normal heart sounds.     No gallop.  Pulmonary:     Effort: Pulmonary effort is normal. No respiratory distress.     Breath sounds: Normal breath sounds. No stridor. No wheezing, rhonchi or rales.     Comments: No chest wall tenderness  Good air exch  Chest:     Chest wall: No tenderness.  Abdominal:     General: There is no distension or abdominal bruit.     Palpations: Abdomen is soft. There is no mass.     Tenderness: There is no abdominal tenderness. There is no right CVA tenderness, left CVA tenderness, guarding or  rebound.  Musculoskeletal:     Cervical back: Normal range of motion and neck supple.     Right lower leg: No edema.     Left lower leg: No edema.     Comments: No LE tenderness     Lymphadenopathy:     Cervical: No cervical adenopathy.  Skin:    General: Skin is warm and dry.     Coloration: Skin is not pale.     Findings: No rash.  Neurological:     Mental Status: She is alert.     Coordination: Coordination normal.     Deep Tendon Reflexes: Reflexes are normal and symmetric. Reflexes normal.  Psychiatric:        Mood and Affect: Mood normal.           Assessment & Plan:   Problem List Items Addressed This Visit       Cardiovascular and Mediastinum   Essential hypertension   bp in fair control at this time  BP Readings from Last 1 Encounters:  06/17/24 123/60   No changes needed Most recent labs reviewed  Disc lifstyle change with low sodium diet and exercise  Supposed to be on losartan  100 instead of 50, sent to pharm  Amlodpine 5 mg daily  Continues nephrology care         Digestive   Barrett's esophagus   Per pt =has not been symptomatic lately  Avoids triggers Continues omeprazole  20 mg bid   Unsure if recent chest fullness /symptom may be related         Other   Shortened PR interval   Relevant Orders   Ambulatory referral to Cardiology   Chest discomfort - Primary   Atypical disccomfort/full feeling in upper chest worse with coughing or bending over in pt with history of SLE, and also GERD/barretts's esophagus  Normal cxr today  No swallowing problems  Reassuring exam  EKG notes short PR (new) otherwise no st or t wave changes   Has vascular risk factors   In light of this will refer to cardiology for further eval/treatment  Will also discuss symptoms with her rheumatologist   May need GI follow up as well-continues omeprazole  20 mg bid  Encouraged to avoid GERD triggers  Call back and Er precautions noted in detail today          Relevant Orders   EKG 12-Lead   DG Chest 2 View (Completed)   Ambulatory referral to Cardiology

## 2024-06-17 NOTE — Assessment & Plan Note (Addendum)
 Atypical disccomfort/full feeling in upper chest worse with coughing or bending over in pt with history of SLE, and also GERD/barretts's esophagus  Normal cxr today  No swallowing problems  Reassuring exam  EKG notes short PR (new) otherwise no st or t wave changes   Has vascular risk factors   In light of this will refer to cardiology for further eval/treatment  Will also discuss symptoms with her rheumatologist   May need GI follow up as well-continues omeprazole  20 mg bid  Encouraged to avoid GERD triggers  Call back and Er precautions noted in detail today

## 2024-06-17 NOTE — Assessment & Plan Note (Addendum)
 Continues nephrology follow up  From HTN and SLE  With proteinuria  Per pt losartan  was increase to 100 mg but her doctor forgot to send Sent today  Will monitor blood pressure   Planning for renal bx  GFR 43.3

## 2024-06-18 ENCOUNTER — Encounter: Payer: Self-pay | Admitting: Dermatology

## 2024-07-01 DIAGNOSIS — N1831 Chronic kidney disease, stage 3a: Secondary | ICD-10-CM | POA: Diagnosis not present

## 2024-07-01 DIAGNOSIS — H40023 Open angle with borderline findings, high risk, bilateral: Secondary | ICD-10-CM | POA: Diagnosis not present

## 2024-07-01 DIAGNOSIS — H4710 Unspecified papilledema: Secondary | ICD-10-CM | POA: Diagnosis not present

## 2024-07-02 ENCOUNTER — Other Ambulatory Visit: Payer: Self-pay | Admitting: Gastroenterology

## 2024-07-02 DIAGNOSIS — M25512 Pain in left shoulder: Secondary | ICD-10-CM | POA: Diagnosis not present

## 2024-07-07 DIAGNOSIS — N1831 Chronic kidney disease, stage 3a: Secondary | ICD-10-CM | POA: Diagnosis not present

## 2024-07-07 DIAGNOSIS — M329 Systemic lupus erythematosus, unspecified: Secondary | ICD-10-CM | POA: Diagnosis not present

## 2024-07-07 DIAGNOSIS — R809 Proteinuria, unspecified: Secondary | ICD-10-CM | POA: Diagnosis not present

## 2024-07-07 DIAGNOSIS — I129 Hypertensive chronic kidney disease with stage 1 through stage 4 chronic kidney disease, or unspecified chronic kidney disease: Secondary | ICD-10-CM | POA: Diagnosis not present

## 2024-07-08 ENCOUNTER — Ambulatory Visit: Admitting: Dermatology

## 2024-07-08 DIAGNOSIS — M25512 Pain in left shoulder: Secondary | ICD-10-CM | POA: Diagnosis not present

## 2024-07-14 DIAGNOSIS — M25512 Pain in left shoulder: Secondary | ICD-10-CM | POA: Diagnosis not present

## 2024-07-15 DIAGNOSIS — M25512 Pain in left shoulder: Secondary | ICD-10-CM | POA: Diagnosis not present

## 2024-07-20 ENCOUNTER — Ambulatory Visit: Admitting: Dermatology

## 2024-07-22 DIAGNOSIS — M25512 Pain in left shoulder: Secondary | ICD-10-CM | POA: Diagnosis not present

## 2024-07-29 ENCOUNTER — Other Ambulatory Visit: Payer: Self-pay | Admitting: Adult Health

## 2024-07-29 DIAGNOSIS — M25512 Pain in left shoulder: Secondary | ICD-10-CM | POA: Diagnosis not present

## 2024-08-06 ENCOUNTER — Other Ambulatory Visit: Payer: Self-pay | Admitting: Obstetrics and Gynecology

## 2024-08-06 DIAGNOSIS — Z1231 Encounter for screening mammogram for malignant neoplasm of breast: Secondary | ICD-10-CM

## 2024-08-13 ENCOUNTER — Ambulatory Visit: Admitting: Neurology

## 2024-08-20 ENCOUNTER — Encounter: Payer: Self-pay | Admitting: Neurology

## 2024-08-20 ENCOUNTER — Ambulatory Visit: Admitting: Neurology

## 2024-08-20 VITALS — BP 122/75 | HR 70 | Ht 65.0 in | Wt 196.5 lb

## 2024-08-20 DIAGNOSIS — R42 Dizziness and giddiness: Secondary | ICD-10-CM | POA: Diagnosis not present

## 2024-08-20 DIAGNOSIS — G3184 Mild cognitive impairment, so stated: Secondary | ICD-10-CM

## 2024-08-20 DIAGNOSIS — R410 Disorientation, unspecified: Secondary | ICD-10-CM

## 2024-08-20 DIAGNOSIS — G43709 Chronic migraine without aura, not intractable, without status migrainosus: Secondary | ICD-10-CM

## 2024-08-20 DIAGNOSIS — I679 Cerebrovascular disease, unspecified: Secondary | ICD-10-CM

## 2024-08-20 MED ORDER — NURTEC 75 MG PO TBDP: 75.0000 mg | ORAL_TABLET | ORAL | 11 refills | Status: AC | PRN

## 2024-08-20 MED ORDER — DIAZEPAM 2 MG PO TABS
2.0000 mg | ORAL_TABLET | Freq: Two times a day (BID) | ORAL | 3 refills | Status: AC | PRN
Start: 2024-08-20 — End: ?

## 2024-08-20 MED ORDER — LEVETIRACETAM ER 500 MG PO TB24: 500.0000 mg | ORAL_TABLET | Freq: Every evening | ORAL | 11 refills | Status: AC

## 2024-08-20 NOTE — Progress Notes (Signed)
 Chief Complaint  Patient presents with   Follow-up    Pt in room 15. Daughter in room.Here for memory follow up. MOCA:26     ASSESSMENT AND PLAN  Jennifer Rose is a 68 y.o. female    Possible Partial seizure  Prolonged confusion in September 2024, diffuse slowing on EEG, repeat EEG showed left hemispheric spike slow wave forms, Tolerating Keppra  xr 500 mg 2 tablets every night  Repeat EEG, no recurrent event, if EEG looks benign, may consider lower dose  Mild cognitive impairment  Suspect vascular etiology with evidence of prior strokes and moderate to severe chronic small vessel disease on prior MRI and possibly from underlying lupus  No family history of Alzheimer's dementia MoCA examination 26/30 in Oct 2025 stable.   Cerebral vascular disease  2D echo benign  Carotid ultrasound bilateral ECA <50% stenosis, near normal extracranial vessels bilaterally  Continue to follow with PCP for vascular risk factor management  Aspirin 81 mg daily  Encourage moderate exercise  Chronic migraine headaches  Long history of migraine, intermittent vertigo with weather changes, suspicious for migraine variant,  Tried Ubrelvy , Nurtec without significant benefit, not a candidate for triptan,  Valium 2 mg low-dose as needed   Return in 1 year  DIAGNOSTIC DATA (LABS, IMAGING, TESTING) - I reviewed patient records, labs, notes, testing and imaging myself where available.   MEDICAL HISTORY:  Jennifer Rose is a 68 year old female, seen in request by her primary care doctor [place], [place] for evaluation of cognitive change, initial evaluation was with her daughter July 25, 2023   History is obtained from the patient and review of electronic medical records. I personally reviewed pertinent available imaging films in PACS.   PMHx of  HTN Lupus more than 30 years, joint pain, skin rashes, muscle ache, optic nerve, Dr. 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  Hospital admission in September 2024 for increased confusion for 24 hours, it was a day that interrupted her routine, she has to get up early to help her daughter, she drove her daughter to the bank, then supposed to drive back to her daughter's house to meet somebody, on her way driving back, she called her daughter multiple times, asking what she is supposed to do,  Even when her daughter meet her few hours later, she was noticed to have brain fog, repeating herself, confused,  MRI of the brain with without contrast July 08, 2023 showed no acute process, advanced volume loss for age, moderate to severe chronic small vessel disease, lacunar 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 Lynwood Mai at [facility] rheumatology, for lupus, seen by Dr. Onesimo May 2024 for thrombocytopenia, pathology, platelet clumps, thought that systemic lupus can cause platelet clumping outside the body, consistent with pseudothrombocytopenia, may also have an immune component with her history of lupus, may produce antibodies against platelet,  Ophthalmology evaluation by Dr.Zamora in March 2023, for optic disc edema, OD, obscuration of disc vessels with hemorrhage, visual acuity remain 20/20, CT of the brain and orbit with without contrast showed no retrobulbar pathology, hyperfluorescence  staining and leakage of disc OD, consistent with lupus associated papillitis OD, she was treated with prednisone  40mg  tapering down slowly, 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, was treated with CellCept  500 mg 3 tablets  twice a day from August 2023 to May 2024  MoCA examination 24/30 today  She has chronic migraine headaches, not a good candidate for triptan anymore due to cerebrovascular disease  UPDATE Aug 20 2024: She is accompanied by her daughter today, overall doing well, no recurrent confusion spells, mild cognitive impairment, functioning well, lives independently, still drives short distance  She is on Keppra  XR 500 mg 2 tablets every night due to prolonged confusion episode leading to hospital admission in September 2024, EEG at that time showed intermittent mild diffuse slowing, repeat EEG in October had intermittent left hemisphere spike slow activity,  Extensive cerebrovascular disease, vascular risk factor of aging, hypertension, long history of lupus, Echocardiogram November 2024 was fairly normal Ultrasound of carotid artery less than 50% stenosis of bilateral external carotid artery, otherwise no significant stenosis  She is taking aspirin 81 mg daily  Long history of migraine, now with intermittent vertigo with weather change, tried Ubrelvy , Nurtec without significant benefit    Stable MoCA, 26 out of 30 today  PHYSICAL EXAM:   Vitals:   08/20/24 0934  BP: 122/75  Pulse: 70  Weight: 196 lb 8 oz (89.1 kg)  Height: 5' 5 (1.651 m)    Body mass index is 32.7 kg/m.  PHYSICAL EXAMNIATION:  Gen: NAD, very pleasant middle-age African-American female, conversant, well nourised, well groomed                     Cardiovascular: Regular rate rhythm, no peripheral edema, warm, nontender. Eyes: Conjunctivae clear without exudates or hemorrhage Neck: Supple, no carotid bruits. Pulmonary: Clear to auscultation bilaterally   NEUROLOGICAL EXAM:  MENTAL STATUS: Speech/cognition: Awake, alert, oriented to history taking and casual conversation    08/20/2024    9:38 AM 02/12/2024    8:47 AM 07/25/2023    8:07 AM  Montreal Cognitive Assessment   Visuospatial/ Executive (0/5) 4 5 4    Naming (0/3) 3 3 3   Attention: Read list of digits (0/2) 2 2 2   Attention: Read list of letters (0/1) 1 1 1   Attention: Serial 7 subtraction starting at 100 (0/3) 3 3 3   Language: Repeat phrase (0/2) 2 2 2   Language : Fluency (0/1) 1 1 0  Abstraction (0/2) 2 2 2   Delayed Recall (0/5) 2 0 1  Orientation (0/6) 6 6 6   Total 26 25 24     CRANIAL NERVES: CN II: Visual fields are full to confrontation. Pupils are round equal and briskly reactive to light. CN III, IV, VI: extraocular movement are normal. No ptosis. CN V: Facial sensation is intact to light touch CN VII: Face is symmetric with normal eye closure  CN VIII: Hearing is normal to causal conversation. CN IX, X: Phonation is normal. CN XI: Head turning and shoulder shrug are intact  MOTOR: There is no pronator drift of out-stretched arms. Muscle bulk and tone are normal. Muscle strength is normal.  REFLEXES: Reflexes are 2+ and symmetric at the biceps, triceps, knees, and ankles. Plantar responses are flexor.  SENSORY: Intact to light touch, pinprick and vibratory sensation are intact in fingers and toes.  COORDINATION: There is no trunk or limb dysmetria noted.  GAIT/STANCE: Posture is normal. Gait is steady     REVIEW OF SYSTEMS:  Full 14 system review  of systems performed and notable only for as above All other review of systems were negative.   ALLERGIES: Allergies  Allergen Reactions   Shrimp Extract Shortness Of Breath    Throat swelling,   Guaifenesin Other (See Comments)    hiccups   Mucinex Clear & Cool Day-Night     Hiccups     HOME MEDICATIONS: Current Outpatient Medications  Medication Sig Dispense Refill   amLODipine  (NORVASC ) 5 MG tablet TAKE ONE TABLET BY MOUTH ONCE A DAY (Patient taking differently: Take 10 mg by mouth daily.) 90 tablet 1   BENLYSTA 200 MG/ML SOAJ Inject as directed once a week.     Blood Pressure KIT Check BP every morning and evening and keep a log 1 kit 0    cholecalciferol (VITAMIN D ) 1000 UNITS tablet Take 2,000 Units by mouth daily.     fexofenadine  (ALLEGRA ) 180 MG tablet Take 180 mg by mouth daily as needed for allergies or rhinitis.     fluticasone  (FLONASE ) 50 MCG/ACT nasal spray USE 1 TO 2 SPRAYS IN EACH NOSTRIL DAILY 16 g 5   hydrocortisone  2.5 % cream Apply topically 2 (two) times daily as needed (Rash). 90 g 3   hydroxychloroquine  (PLAQUENIL ) 200 MG tablet Take 200 mg by mouth 2 (two) times daily. (Patient taking differently: Take 200 mg by mouth daily.)     latanoprost (XALATAN) 0.005 % ophthalmic solution Place 1 drop into both eyes daily.     levETIRAcetam  (KEPPRA  XR) 500 MG 24 hr tablet TAKE TWO TABLETS (1,000 MG TOTAL) BY MOUTH AT BEDTIME. 60 tablet 5   losartan  (COZAAR ) 100 MG tablet Take 1 tablet (100 mg total) by mouth daily. 90 tablet 1   meclizine (ANTIVERT) 12.5 MG tablet Take 12.5 mg by mouth 3 (three) times daily as needed.     omeprazole  (PRILOSEC) 20 MG capsule TAKE ONE CAPSULE (20 MG TOTAL) BY MOUTH TWO TIMES DAILY BEFORE A MEAL. 180 capsule 0   potassium chloride  (KLOR-CON ) 10 MEQ tablet Take 2 tablets (20 mEq total) by mouth daily. 180 tablet 3   Rimegepant Sulfate (NURTEC) 75 MG TBDP Take 1 tablet (75 mg total) by mouth as needed. 16 tablet 11   Safety Seal Miscellaneous MISC Apply 1 application  topically in the morning and at bedtime. Apply to face and neck 30 g 2   tacrolimus  (PROTOPIC ) 0.1 % ointment Apply topically 2 (two) times daily. Apply to affected areas twice daily for 2 weeks.  Alternate with Clobetasol  cream on areas on body 100 g 2   No current facility-administered medications for this visit.    PAST MEDICAL HISTORY: Past Medical History:  Diagnosis Date   Allergy    allergic rhinitis   Eye pressure    GERD (gastroesophageal reflux disease)    Hypertension 03/22/2004   Lupus    Uterine fibroid 08/22/2000   per GYN    PAST SURGICAL HISTORY: Past Surgical History:  Procedure Laterality Date    BREAST CYST EXCISION Right 38 yrs ago   x2   CESAREAN SECTION     x 3   CHOLECYSTECTOMY     ENDOMETRIAL ABLATION  01/2002   polyps?DUB    FAMILY HISTORY: Family History  Problem Relation Age of Onset   Blindness Mother    Stroke Mother    Diabetes Mother    Glaucoma Mother    Hypertension Mother    Glaucoma Father    Diabetes Father    Hypertension Father    Prostate  cancer Father    Stroke Sister    Diabetes Sister    Glaucoma Maternal Grandmother    Diabetes Maternal Grandmother    Hypertension Maternal Grandmother    Colon cancer Maternal Grandmother    Heart attack Maternal Grandfather    Glaucoma Paternal Grandmother    Heart disease Paternal Grandmother    Colitis Daughter    Hypertension Daughter    Diabetes Daughter     SOCIAL HISTORY: Social History   Socioeconomic History   Marital status: Divorced    Spouse name: Not on file   Number of children: 3   Years of education: Not on file   Highest education level: Not on file  Occupational History   Occupation: retired Medical laboratory scientific officer  Tobacco Use   Smoking status: Never   Smokeless tobacco: Never  Vaping Use   Vaping status: Never Used  Substance and Sexual Activity   Alcohol use: No    Alcohol/week: 0.0 standard drinks of alcohol   Drug use: No   Sexual activity: Not on file  Other Topics Concern   Not on file  Social History Narrative   Not on file   Social Drivers of Health   Financial Resource Strain: Low Risk  (04/21/2024)   Overall Financial Resource Strain (CARDIA)    Difficulty of Paying Living Expenses: Not hard at all  Food Insecurity: No Food Insecurity (04/21/2024)   Hunger Vital Sign    Worried About Running Out of Food in the Last Year: Never true    Ran Out of Food in the Last Year: Never true  Transportation Needs: No Transportation Needs (04/21/2024)   PRAPARE - Administrator, Civil Service (Medical): No    Lack of Transportation (Non-Medical): No  Physical Activity:  Inactive (04/21/2024)   Exercise Vital Sign    Days of Exercise per Week: 0 days    Minutes of Exercise per Session: 0 min  Stress: No Stress Concern Present (04/21/2024)   [employer] of Occupational Health - Occupational Stress Questionnaire    Feeling of Stress: Only a little  Social Connections: Socially Isolated (04/21/2024)   Social Connection and Isolation Panel    Frequency of Communication with Friends and Family: More than three times a week    Frequency of Social Gatherings with Friends and Family: Three times a week    Attends Religious Services: Never    Active Member of Clubs or Organizations: No    Attends Banker Meetings: Never    Marital Status: Divorced  Catering Manager Violence: Not At Risk (04/21/2024)   Humiliation, Afraid, Rape, and Kick questionnaire    Fear of Current or Ex-Partner: No    Emotionally Abused: No    Physically Abused: No    Sexually Abused: No   Modena Callander. M.D. Ph.D.

## 2024-08-24 ENCOUNTER — Encounter: Payer: Self-pay | Admitting: *Deleted

## 2024-08-24 ENCOUNTER — Other Ambulatory Visit: Payer: Self-pay | Admitting: Gastroenterology

## 2024-08-26 ENCOUNTER — Ambulatory Visit: Attending: Cardiology | Admitting: Internal Medicine

## 2024-08-26 ENCOUNTER — Telehealth: Payer: Self-pay | Admitting: Gastroenterology

## 2024-08-26 ENCOUNTER — Encounter: Payer: Self-pay | Admitting: Internal Medicine

## 2024-08-26 VITALS — BP 142/74 | HR 76 | Ht 65.0 in | Wt 195.4 lb

## 2024-08-26 DIAGNOSIS — I10 Essential (primary) hypertension: Secondary | ICD-10-CM

## 2024-08-26 DIAGNOSIS — E782 Mixed hyperlipidemia: Secondary | ICD-10-CM

## 2024-08-26 DIAGNOSIS — R0789 Other chest pain: Secondary | ICD-10-CM

## 2024-08-26 DIAGNOSIS — M3219 Other organ or system involvement in systemic lupus erythematosus: Secondary | ICD-10-CM | POA: Diagnosis not present

## 2024-08-26 DIAGNOSIS — I340 Nonrheumatic mitral (valve) insufficiency: Secondary | ICD-10-CM

## 2024-08-26 MED ORDER — OMEPRAZOLE 20 MG PO CPDR: DELAYED_RELEASE_CAPSULE | ORAL | 0 refills | Status: DC

## 2024-08-26 NOTE — Patient Instructions (Signed)
 Medication Instructions:  Your physician recommends that you continue on your current medications as directed. Please refer to the Current Medication list given to you today.  *If you need a refill on your cardiac medications before your next appointment, please call your pharmacy*  Lab Work: NONE If you have labs (blood work) drawn today and your tests are completely normal, you will receive your results only by: MyChart Message (if you have MyChart) OR A paper copy in the mail If you have any lab test that is abnormal or we need to change your treatment, we will call you to review the results.  Testing/Procedures: NONE  Follow-Up: At [facility], you and your health needs are our priority.  As part of our continuing mission to provide you with exceptional heart care, our providers are all part of one team.  This team includes your primary Cardiologist (physician) and Advanced Practice Providers or APPs (Physician Assistants and Nurse Practitioners) who all work together to provide you with the care you need, when you need it.  Your next appointment:   As needed  Provider:   Kriste, MD  We recommend signing up for the patient portal called MyChart.  Sign up information is provided on this After Visit Summary.  MyChart is used to connect with patients for Virtual Visits (Telemedicine).  Patients are able to view lab/test results, encounter notes, upcoming appointments, etc.  Non-urgent messages can be sent to your provider as well.   To learn more about what you can do with MyChart, go to [email or web address].

## 2024-08-26 NOTE — Progress Notes (Signed)
 Cardiology Office Note   Date:  08/26/2024  ID:  Jennifer Rose [birth year], MRN [id number] PCP: Jennifer Laine LABOR, MD  [facility] Providers Cardiologist:  Emeline FORBES Calender, MD     History of Present Illness Jennifer Rose is a 68 y.o. female with past medical history of Barrett's esophagus and duodenitis, lupus, hypertension, CKD 3, thrombocytopenia, mild cognitive impairment who was referred by her PCP for chest pain.    Patient states that she has more epigastric discomfort without radiation.  It occurs rarely and intermittently, more commonly in the mornings when she wakes up.  Does not occur with exertion.  Both her and her daughter at bedside believe it is more likely her Barrett's esophagus.  Family history of MI and mitral valve prolapse in her father.  No shortness of breath or lower extremity.    ROS:  Review of Systems  All other systems reviewed and are negative.   Physical Exam  Physical Exam Vitals and nursing note reviewed.  Constitutional:      Appearance: Normal appearance.  HENT:     Head: Normocephalic and atraumatic.  Eyes:     Conjunctiva/sclera: Conjunctivae normal.  Neck:     Vascular: No carotid bruit.  Cardiovascular:     Rate and Rhythm: Normal rate and regular rhythm.  Pulmonary:     Effort: Pulmonary effort is normal.     Breath sounds: Normal breath sounds.  Musculoskeletal:        General: No swelling or tenderness.  Skin:    Coloration: Skin is not jaundiced or pale.  Neurological:     Mental Status: She is alert.     VS:  BP (!) 142/74 (BP Location: Left Arm, Patient Position: Sitting)   Pulse 76   Ht 5' 5 (1.651 m)   Wt 195 lb 6.4 oz (88.6 kg)   SpO2 96%   BMI 32.52 kg/m         Wt Readings from Last 3 Encounters:  08/26/24 195 lb 6.4 oz (88.6 kg)  08/20/24 196 lb 8 oz (89.1 kg)  06/17/24 192 lb 6 oz (87.3 kg)     EKG Interpretation Date/Time:  Wednesday August 26 2024 08:17:47 EST Ventricular Rate:   72 PR Interval:  106 QRS Duration:  82 QT Interval:  400 QTC Calculation: 438 R Axis:   16  Text Interpretation: Sinus rhythm with short PR with occasional Premature ventricular complexes Possible Inferior infarct , age undetermined When compared with ECG of 06/18/24 No significant change since Confirmed by Calender Emeline [phone]) on 08/26/2024 8:20:34 AM    Studies Reviewed   Echocardiogram 09/13/2023:   1. Left ventricular ejection fraction, by estimation, is 55 to 60%. The  left ventricle has normal function. The left ventricle has no regional  wall motion abnormalities. Left ventricular diastolic parameters are  consistent with Grade I diastolic  dysfunction (impaired relaxation).   2. Right ventricular systolic function is normal. The right ventricular  size is normal. There is normal pulmonary artery systolic pressure. The  estimated right ventricular systolic pressure is 26.8 mmHg.   3. The mitral valve is grossly normal. Mild mitral valve regurgitation.  No evidence of mitral stenosis.   4. The aortic valve is tricuspid. Aortic valve regurgitation is not  visualized. No aortic stenosis is present.   5. The inferior vena cava is normal in size with greater than 50%  respiratory variability, suggesting right atrial pressure of 3 mmHg.  Risk Assessment/Calculations    ASCVD risk score: The 10-year ASCVD risk score (Arnett DK, et al., 2019) is: 22.3%   Values used to calculate the score:     Age: 38 years     Clincally relevant sex: Female     Is Non-Hispanic African American: Yes     Diabetic: Yes     Tobacco smoker: No     Systolic Blood Pressure: 142 mmHg     Is BP treated: Yes     HDL Cholesterol: 51.1 mg/dL     Total Cholesterol: 158 mg/dL   ASSESSMENT  Noncardiac chest pain, likely GI related echo 1 year ago was unremarkable.  Symptoms are noncardiac in nature Hypertension on amlodipine  5 mg Hyperlipidemia elevated ASCVD score in the setting of SLE  which is inflammatory.  Though her LDL has been relatively controlled, I recommended that she start on at least a moderate intensity statin given her risk factors for a goal LDL less than 70. SLE Mild cognitive impairment Mild mitral regurgitation CKD 3    Plan  Recommend the patient started on a moderate intensity statin, likely rosuvastatin 10 mg daily as above with a goal LDL less than 70.  She will discuss this with her rheumatologist and PCP who can manage further Patient does not need any further cardiac workup She was advised to return to the clinic or go to the ED if she begins having more predictable and persistent chest pain that worsens with exertion and relieved with rest  Follow up: As needed          Signed, Emeline FORBES Calender, MD

## 2024-08-26 NOTE — Telephone Encounter (Signed)
 Sent prescription refill of omeprazole  to patient's pharmacy until scheduled appt in December.

## 2024-08-26 NOTE — Telephone Encounter (Signed)
 Inbound call from patient stating she is scheduled for a OV with Dr. Stacia on 12/17 at 8:30. Patient is requesting refill for Prilosec. Please advise.

## 2024-08-27 DIAGNOSIS — M6281 Muscle weakness (generalized): Secondary | ICD-10-CM | POA: Diagnosis not present

## 2024-08-27 DIAGNOSIS — I7300 Raynaud's syndrome without gangrene: Secondary | ICD-10-CM | POA: Diagnosis not present

## 2024-08-27 DIAGNOSIS — K1121 Acute sialoadenitis: Secondary | ICD-10-CM | POA: Diagnosis not present

## 2024-08-27 DIAGNOSIS — R808 Other proteinuria: Secondary | ICD-10-CM | POA: Diagnosis not present

## 2024-08-27 DIAGNOSIS — D696 Thrombocytopenia, unspecified: Secondary | ICD-10-CM | POA: Diagnosis not present

## 2024-08-27 DIAGNOSIS — G40909 Epilepsy, unspecified, not intractable, without status epilepticus: Secondary | ICD-10-CM | POA: Diagnosis not present

## 2024-08-27 DIAGNOSIS — M329 Systemic lupus erythematosus, unspecified: Secondary | ICD-10-CM | POA: Diagnosis not present

## 2024-08-27 DIAGNOSIS — R058 Other specified cough: Secondary | ICD-10-CM | POA: Diagnosis not present

## 2024-08-27 DIAGNOSIS — H4602 Optic papillitis, left eye: Secondary | ICD-10-CM | POA: Diagnosis not present

## 2024-08-31 ENCOUNTER — Ambulatory Visit (INDEPENDENT_AMBULATORY_CARE_PROVIDER_SITE_OTHER): Admitting: Neurology

## 2024-08-31 DIAGNOSIS — G3184 Mild cognitive impairment, so stated: Secondary | ICD-10-CM | POA: Diagnosis not present

## 2024-08-31 DIAGNOSIS — I679 Cerebrovascular disease, unspecified: Secondary | ICD-10-CM

## 2024-08-31 DIAGNOSIS — G43709 Chronic migraine without aura, not intractable, without status migrainosus: Secondary | ICD-10-CM

## 2024-08-31 DIAGNOSIS — R410 Disorientation, unspecified: Secondary | ICD-10-CM

## 2024-09-02 ENCOUNTER — Ambulatory Visit
Admission: RE | Admit: 2024-09-02 | Discharge: 2024-09-02 | Disposition: A | Discharge status: Home / Self Care | Source: Ambulatory Visit | Attending: Obstetrics and Gynecology | Admitting: Obstetrics and Gynecology

## 2024-09-02 DIAGNOSIS — Z1231 Encounter for screening mammogram for malignant neoplasm of breast: Secondary | ICD-10-CM

## 2024-09-03 NOTE — Procedures (Signed)
   HISTORY: 68 year old female with mild cognitive impairment presenting with prolonged confusion  TECHNIQUE:  This is a routine 16 channel EEG recording with one channel devoted to a limited EKG recording.  It was performed during wakefulness, drowsiness and asleep.  Hyperventilation and photic stimulation were performed as activating procedures.  There are minimum muscle and movement artifact noted.  Upon maximum arousal, posterior dominant waking rhythm consistent of rhythmic alpha range activity. Activities are symmetric over the bilateral posterior derivations and attenuated with eye opening.  There was intermittent short lasting frontal dominant higher amplitude theta to delta range activity  Photic stimulation did not alter the tracing.  Hyperventilation produced mild/moderate buildup with higher amplitude and the slower activities noted.  During EEG recording, patient developed drowsiness and no deeper stage of sleep was achieved  During EEG recording, there was no epileptiform discharge noted.  EKG demonstrate normal sinus rhythm.  CONCLUSION: This is a mild abnormal EEG.  There is no electrodiagnostic evidence of epileptiform discharge.  There is evidence of intermittent short lasting mild bifrontal slowing, indicating mild bihemispheric malfunction.  Oberia Beaudoin, M.D. Ph.D.  [facility] [address] [place], [place] [address] Phone: [phone] Fax:      [phone]

## 2024-09-06 ENCOUNTER — Ambulatory Visit: Payer: Self-pay | Admitting: Family Medicine

## 2024-09-10 ENCOUNTER — Telehealth: Payer: Self-pay | Admitting: Neurology

## 2024-09-10 NOTE — Telephone Encounter (Signed)
 Pt called wanting to know when she will get a call with her EEG results. Please advise.

## 2024-09-24 NOTE — Telephone Encounter (Signed)
 Pt called reqesting EEG results.

## 2024-09-24 NOTE — Telephone Encounter (Signed)
 Patient calling to check on status of EEG results and would like a call back

## 2024-09-29 ENCOUNTER — Encounter: Payer: Self-pay | Admitting: Neurology

## 2024-09-29 NOTE — Telephone Encounter (Signed)
 But I called patient, EEG showed no epileptiform mild slowing, keep current medications he denies recurrent seizure

## 2024-10-07 ENCOUNTER — Encounter: Payer: Self-pay | Admitting: Gastroenterology

## 2024-10-07 ENCOUNTER — Ambulatory Visit: Admitting: Gastroenterology

## 2024-10-07 VITALS — BP 132/74 | HR 68 | Ht 65.0 in | Wt 193.2 lb

## 2024-10-07 DIAGNOSIS — K219 Gastro-esophageal reflux disease without esophagitis: Secondary | ICD-10-CM | POA: Diagnosis not present

## 2024-10-07 DIAGNOSIS — K2270 Barrett's esophagus without dysplasia: Secondary | ICD-10-CM | POA: Diagnosis not present

## 2024-10-07 DIAGNOSIS — Z860100 Personal history of colon polyps, unspecified: Secondary | ICD-10-CM | POA: Diagnosis not present

## 2024-10-07 MED ORDER — OMEPRAZOLE 20 MG PO CPDR: DELAYED_RELEASE_CAPSULE | ORAL | 3 refills | Status: AC

## 2024-10-07 NOTE — Progress Notes (Unsigned)
 Discussed the use of AI scribe software for clinical note transcription with the patient, who gave verbal consent to proceed.  HPI : Jennifer Rose is a 68 year old female with Barrett's esophagus and GERD who presents for a medication refill and follow-up.  She experiences occasional cough associated with her reflux symptoms, which are generally well-controlled with omeprazole  (Prilosec) taken twice daily.  She says she has been on twice daily omeprazole  for many years.  She has a history of Barrett's esophagus (C5M5)diagnosed in November 2023. Her last colonoscopy was performed in 2021 by Dr. Kristie with 2 adenomas removed.  She was recommended to repeat in 5 years.   The patient's  inquired about a new test called Tissue Cipher for monitoring Barrett's, but it is not available at our facility.  She has lupus, which has been more active over the past year, affecting her joints and kidneys. She is on Benlysta injections once a week. She also has stage three kidney disease attributed to lupus. She recently consulted a cardiologist to ensure her lupus was not affecting her heart.  No abdominal pain, blood in the stool, diarrhea, or major heart or lung problems.       EGD Sep 05, 2022 Barrett's changes from 36 to 31 cm (C5M5) 4 cm hiatal hernia Patchy atrophic/inflamed mucosa in the duodenal bulb Otherwise normal  Diagnosis  1. Surgical [P], duodenal bulb  REACTIVE DUODENAL MUCOSA WITH GASTRIC METAPLASIA CONSISTENT WITH PEPTIC DUODENITIS   2. Surgical [P], esophagus 35  REACTIVE SQUAMOUS MUCOSA GASTRIC TYPE MUCOSA SHOWING MILD CHRONIC GASTRITIS AND EXTENSIVE INTESTINAL METAPLASIA NEGATIVE FOR DYSPLASIA AND CARCINOMA   3. Surgical [P], esophagus at 33  GASTRIC TYPE MUCOSA SHOWING CHRONIC GASTRITIS AND EXTENSIVE INTESTINAL METAPLASIA NEGATIVE FOR SQUAMOUS EPITHELIUM NEGATIVE FOR DYSPLASIA AND CARCINOMA   4. Surgical [P], esophagus at 31  REACTIVE SQUAMOUS MUCOSA GASTRIC TYPE MUCOSA  SHOWING CHRONIC GASTRITIS AND INTESTINAL METAPLASIA NEGATIVE FOR DYSPLASIA AND CARCINOMA (SEE DIAGNOSTIC NOTE)     Colonoscopy June 2021 (Dr. Kristie) Sigmoid tubular adenoma (7mm) Ascending colon tubular adenoma (2mm) Recommended repeat colonoscopy 5 years  Past Medical History:  Diagnosis Date   Allergy    allergic rhinitis   Eye pressure    GERD (gastroesophageal reflux disease)    Hypertension 03/22/2004   Lupus    Uterine fibroid 08/22/2000   per GYN     Past Surgical History:  Procedure Laterality Date   BREAST CYST EXCISION Right 36 yrs ago   x2   CESAREAN SECTION     x 3   CHOLECYSTECTOMY     ENDOMETRIAL ABLATION  01/2002   polyps?DUB   Family History  Problem Relation Age of Onset   Blindness Mother    Stroke Mother    Diabetes Mother    Glaucoma Mother    Hypertension Mother    Glaucoma Father    Diabetes Father    Hypertension Father    Prostate cancer Father    Stroke Sister    Diabetes Sister    Glaucoma Maternal Grandmother    Diabetes Maternal Grandmother    Hypertension Maternal Grandmother    Colon cancer Maternal Grandmother    Heart attack Maternal Grandfather    Glaucoma Paternal Grandmother    Heart disease Paternal Grandmother    Colitis Daughter    Hypertension Daughter    Diabetes Daughter    Social History[1] Current Outpatient Medications  Medication Sig Dispense Refill   amLODipine  (NORVASC ) 5 MG tablet TAKE ONE TABLET  BY MOUTH ONCE A DAY (Patient taking differently: Take 10 mg by mouth daily.) 90 tablet 1   BENLYSTA 200 MG/ML SOAJ Inject as directed once a week.     Blood Pressure KIT Check BP every morning and evening and keep a log 1 kit 0   cholecalciferol (VITAMIN D ) 1000 UNITS tablet Take 2,000 Units by mouth daily.     diazepam  (VALIUM ) 2 MG tablet Take 1 tablet (2 mg total) by mouth every 12 (twelve) hours as needed for anxiety. Do not refill in less than 30 days (Patient not taking: Reported on 08/26/2024) 15 tablet 3    fexofenadine  (ALLEGRA ) 180 MG tablet Take 180 mg by mouth daily as needed for allergies or rhinitis.     fluticasone  (FLONASE ) 50 MCG/ACT nasal spray USE 1 TO 2 SPRAYS IN EACH NOSTRIL DAILY 16 g 5   hydrocortisone  2.5 % cream Apply topically 2 (two) times daily as needed (Rash). 90 g 3   hydroxychloroquine  (PLAQUENIL ) 200 MG tablet Take 200 mg by mouth 2 (two) times daily.     latanoprost (XALATAN) 0.005 % ophthalmic solution Place 1 drop into both eyes daily.     levETIRAcetam  (KEPPRA  XR) 500 MG 24 hr tablet Take 1 tablet (500 mg total) by mouth at bedtime. 60 tablet 11   losartan  (COZAAR ) 100 MG tablet Take 1 tablet (100 mg total) by mouth daily. 90 tablet 1   meclizine (ANTIVERT) 12.5 MG tablet Take 12.5 mg by mouth 3 (three) times daily as needed.     omeprazole  (PRILOSEC) 20 MG capsule TAKE ONE CAPSULE (20 MG TOTAL) BY MOUTH TWO TIMES DAILY BEFORE A MEAL. 180 capsule 0   potassium chloride  (KLOR-CON ) 10 MEQ tablet Take 2 tablets (20 mEq total) by mouth daily. 180 tablet 3   Rimegepant Sulfate (NURTEC) 75 MG TBDP Take 1 tablet (75 mg total) by mouth as needed. 16 tablet 11   Safety Seal Miscellaneous MISC Apply 1 application  topically in the morning and at bedtime. Apply to face and neck 30 g 2   tacrolimus  (PROTOPIC ) 0.1 % ointment Apply topically 2 (two) times daily. Apply to affected areas twice daily for 2 weeks.  Alternate with Clobetasol  cream on areas on body 100 g 2   No current facility-administered medications for this visit.   Allergies[2]   Review of Systems: All systems reviewed and negative except where noted in HPI.    No results found.  Physical Exam: BP 132/74   Pulse 68   Ht 5' 5 (1.651 m)   Wt 193 lb 3 oz (87.6 kg)   BMI 32.15 kg/m  Constitutional: Pleasant,well-developed, African American female in no acute distress.  Accompanied by daughter HEENT: Normocephalic and atraumatic. Conjunctivae are normal. No scleral icterus. Neck supple.  Cardiovascular:  Normal rate, regular rhythm.  Pulmonary/chest: Effort normal and breath sounds normal. No wheezing, rales or rhonchi. Abdominal: Soft, nondistended, nontender. Bowel sounds active throughout. There are no masses palpable. No hepatomegaly. Extremities: no edema Lymphadenopathy: No cervical adenopathy noted. Neurological: Alert and oriented to person place and time. Skin: Skin is warm and dry. No rashes noted. Psychiatric: Normal mood and affect. Behavior is normal.  CBC    Component Value Date/Time   WBC 4.5 06/03/2024 1450   RBC 4.72 06/03/2024 1450   HGB 12.2 06/03/2024 1450   HCT 37.1 06/03/2024 1450   PLT 92.0 (L) 06/03/2024 1450   MCV 78.7 06/03/2024 1450   MCH 26.3 07/09/2023 0414   MCHC 32.9 06/03/2024  1450   RDW 12.6 06/03/2024 1450   LYMPHSABS 0.6 (L) 06/03/2024 1450   MONOABS 0.5 06/03/2024 1450   EOSABS 0.1 06/03/2024 1450   BASOSABS 0.0 06/03/2024 1450    CMP     Component Value Date/Time   NA 140 06/03/2024 1450   K 4.2 06/03/2024 1450   CL 105 06/03/2024 1450   CO2 27 06/03/2024 1450   GLUCOSE 80 06/03/2024 1450   BUN 21 06/03/2024 1450   CREATININE 1.29 (H) 06/03/2024 1450   CREATININE 1.14 (H) 09/24/2019 0838   CALCIUM 9.0 06/03/2024 1450   PROT 6.8 06/03/2024 1450   ALBUMIN 4.0 06/03/2024 1450   AST 22 06/03/2024 1450   AST 36 09/24/2019 0838   ALT 25 06/03/2024 1450   ALT 37 09/24/2019 0838   ALKPHOS 116 06/03/2024 1450   BILITOT 0.3 06/03/2024 1450   BILITOT 0.4 09/24/2019 0838   GFRNONAA 46 (L) 07/09/2023 0414   GFRNONAA 52 (L) 09/24/2019 0838   GFRAA >60 09/24/2019 0838       Latest Ref Rng & Units 06/03/2024    2:50 PM 07/18/2023    9:51 AM 07/09/2023    4:14 AM  CBC EXTENDED  WBC 4.0 - 10.5 K/uL 4.5  5.5  3.8   RBC 3.87 - 5.11 Mil/uL 4.72  4.91  4.71   Hemoglobin 12.0 - 15.0 g/dL 87.7  87.2  87.5   HCT 36.0 - 46.0 % 37.1  39.6  38.2   Platelets 150.0 - 400.0 K/uL 92.0  97.0  74   NEUT# 1.4 - 7.7 K/uL 3.3  4.3    Lymph# 0.7 - 4.0 K/uL  0.6  0.6        ASSESSMENT AND PLAN:  68 year old female with longstanding GERD, found to have long segment Barrett's on upper endoscopy in November 2023.  GERD symptoms currently well-controlled on twice daily omeprazole .    Barrett's esophagus Chronic Barrett's esophagus secondary to GERD, asymptomatic. Increased esophageal cancer risk. Surveillance endoscopy planned for 2026.  - Perform surveillance endoscopy in June 2026 to assess Barrett's esophagus and check for dysplasia. - Continue omeprazole  to manage reflux and prevent further esophageal damage.  Gastroesophageal reflux disease Well-controlled with omeprazole . Occasional cough. Discussed reducing omeprazole  to minimize kidney effects due to stage 3 kidney disease. Advised on dietary and positional modifications. - Reduce omeprazole  to once daily and monitor symptoms. - Provided dietary handout to avoid reflux triggers. - Advised taking omeprazole  30-45 minutes before breakfast. - Discussed potential aspirin benefits for cancer risk reduction.  History of colon polyps Due in June 2026. No lower GI symptoms. Discussed combining with upper endoscopy for convenience. - Schedule colonoscopy in June 2026.  Ok to do EGD early in June and combine the two procedures  Recording duration: 15 minutes     Maikayla Beggs E. Stacia, MD [facility]     Tower, Laine LABOR, MD     [1]  Social History Tobacco Use   Smoking status: Never   Smokeless tobacco: Never  Vaping Use   Vaping status: Never Used  Substance Use Topics   Alcohol use: No    Alcohol/week: 0.0 standard drinks of alcohol   Drug use: No  [2]  Allergies Allergen Reactions   Shrimp Extract Shortness Of Breath    Throat swelling,   Guaifenesin Other (See Comments)    hiccups   Mucinex Clear & Cool Day-Night     Hiccups

## 2024-10-07 NOTE — Patient Instructions (Addendum)
 Reduce omeprazole  to once daily. I will send the prescription for twice daily if you need to go back to twice daily dosing.   We have changed your recall EGD/Colon to 03/2025. We will send a reminder when it gets closer to that time.   _______________________________________________________  If your blood pressure at your visit was 140/90 or greater, please contact your primary care physician to follow up on this.  _______________________________________________________  If you are age 66 or older, your body mass index should be between 23-30. Your Body mass index is 32.15 kg/m. If this is out of the aforementioned range listed, please consider follow up with your Primary Care Provider.  If you are age 66 or younger, your body mass index should be between 19-25. Your Body mass index is 32.15 kg/m. If this is out of the aformentioned range listed, please consider follow up with your Primary Care Provider.   ________________________________________________________  The Jennifer Rose would like to encourage you to use MYCHART to communicate with Rose for non-urgent requests or questions.  Due to long hold times on the telephone, sending your provider a message by [facility] may be a faster and more efficient way to get a response.  Please allow 48 business hours for a response.  Please remember that this is for non-urgent requests.  _______________________________________________________  Jennifer Rose is using a team-based approach to care.  Your team is made up of your doctor and two to three APPS. Our APPS (Nurse Practitioners and Physician Assistants) work with your physician to ensure care continuity for you. They are fully qualified to address your health concerns and develop a treatment plan. They communicate directly with your gastroenterologist to care for you. Seeing the Advanced Practice Practitioners on your physician's team can help you by facilitating care more  promptly, often allowing for earlier appointments, access to diagnostic testing, procedures, and other specialty referrals.  GERD in Adults: Diet Changes When you have gastroesophageal reflux disease (GERD), you may need to make changes to your diet. Choosing the right foods can help with your symptoms. Think about working with an expert in healthy eating called a dietitian. They can help you make healthy food choices. What are tips for following this plan? Reading food labels Look for foods that are low in saturated fat. Foods that may help with your symptoms include: Foods with less than 5% of daily value (DV) of fat. Foods with 0 grams of trans fat. Cooking [employer] in ways that don't use a lot of fat. These ways include: Baking. Steaming. Grilling. Broiling. To add flavor, try to use herbs that are low in spice and acidity. Avoid frying your food. Meal planning  Eat small meals often rather than eating 3 large meals each day. Eat your meals slowly in a place where you feel relaxed. If told by your health care provider, avoid: Foods that cause symptoms. Keep a food diary to keep track of foods that cause symptoms. Alcohol. Drinking a lot of liquid with meals. General instructions For 2-3 hours after you eat, avoid: Bending over. Exercise. Lying down. Chew sugar-free gum after meals. What foods should I eat? Eat a healthy diet. Try to include: Foods with high amounts of fiber. These include: Fruits and vegetables. Whole grains and beans. Low-fat dairy products. Lean meats, fish, and poultry. Egg whites. Foods that cause symptoms in someone else may not cause symptoms for you. Work with your provider to find foods that are safe for you. The items  listed above may not be all the foods and drinks you can have. Talk with a dietitian to learn more. The items listed above may not be a complete list of foods and beverages you can eat and drink. Contact a dietitian for more  information. What foods should I avoid? Limiting some of these foods may help with your symptoms. Each person is different. Talk with a dietitian or your provider to help you find the exact foods to avoid. Some of the foods to avoid may include: Fruits Fruits with a lot of acid in them. These may include citrus fruits, such as oranges, grapefruit, pineapple, and lemons. Vegetables Deep-fried vegetables, such as French fries. Vegetables, sauces, or toppings made with added fat and vegetables with acid in them. These may include tomatoes and tomato products, chili peppers, onions, garlic, and horseradish. Grains Pastries or quick breads with added fat. Meats and other proteins High-fat meats, such as fatty beef or pork, hot dogs, ribs, ham, sausage, salami, and bacon. Fried meat or protein, such as fried fish and fried chicken. Egg yolks. Fats and oils Butter. Margarine. Shortening. Ghee. Drinks Coffee and other drinks with caffeine in them. Fizzy and sugary drinks, such as soda and energy drinks. Fruit juice made with acidic fruits, such as orange or grapefruit. Tomato juice. Sweets and desserts Chocolate and cocoa. Donuts. Seasonings and condiments Mint, such as peppermint and spearmint. Condiments, herbs, or seasonings that cause symptoms. These may include curry, hot sauce, or vinegar-based salad dressings. The items listed above may not be all the foods and drinks you should avoid. Talk with a dietitian to learn more. Questions to ask your health care provider Changes to your diet and everyday life are often the first steps taken to manage symptoms of GERD. If these changes don't help, talk with your provider about taking medicines. Where to find more information International Foundation for Gastrointestinal Disorders: aboutgerd.org This information is not intended to replace advice given to you by your health care provider. Make sure you discuss any questions you have with your health  care provider. Document Revised: 08/20/2023 Document Reviewed: 03/06/2023 Elsevier Patient Education  2024 [employer].

## 2024-10-08 DIAGNOSIS — Z860100 Personal history of colon polyps, unspecified: Secondary | ICD-10-CM | POA: Insufficient documentation

## 2024-11-06 ENCOUNTER — Telehealth: Payer: Self-pay | Admitting: Pharmacist

## 2024-11-06 NOTE — Telephone Encounter (Signed)
 Pharmacy Patient Advocate Encounter   Received notification from CoverMyMeds that prior authorization for Nurtec is due for renewal.   Insurance verification completed.   The patient is insured through [place].  Action: PA required; PA submitted to above mentioned insurance via Latent Key/confirmation #/[id number] Status is pending

## 2024-11-10 NOTE — Telephone Encounter (Signed)
 Unfortunately diclofenac is the only drug they mentioned in the denial letter.  I have uploaded it under the media tab.

## 2024-11-10 NOTE — Telephone Encounter (Signed)
 Pharmacy Patient Advocate Encounter  Received notification from [employer] that Prior Authorization for Nurtec has been DENIED.  See denial reason below. No denial letter attached in CMM. Will attach denial letter to Media tab once received.   PA #/Case ID/Reference #: [id number]

## 2024-11-11 ENCOUNTER — Telehealth: Payer: Self-pay | Admitting: Pharmacist

## 2024-11-11 NOTE — Telephone Encounter (Signed)
 An E-Appeal has been submitted. Will advise when response is received, please be advised that most companies may take 30 days to make a decision. Appeal letter and supporting labs have been uploaded and submitted via CMM on 11/11/2024 @4 :28 pm.  Thank you, Devere Pandy, PharmD Clinical Pharmacist  [place]  Direct Dial: [phone]

## 2024-11-11 NOTE — Telephone Encounter (Signed)
 She has abnormal Kidney function, creatinine1.29, GFR 43 in August 2025, not a candidate for NSAIDs, such as diclofenac  Please try to appeal for Nurtec again

## 2024-11-14 ENCOUNTER — Encounter: Payer: Self-pay | Admitting: Pharmacist

## 2024-11-14 NOTE — Telephone Encounter (Signed)
 The appeal for Nurtec has been approved by the insurance through 10/21/2025.  Full letter can be found under the media tab. Patient has been notified via a MyChart message.  Thank you, Devere Pandy, PharmD Clinical Pharmacist  [facility]  Direct Dial: [phone]

## 2024-12-07 ENCOUNTER — Ambulatory Visit: Admitting: Dermatology

## 2025-04-22 ENCOUNTER — Ambulatory Visit

## 2025-04-27 ENCOUNTER — Ambulatory Visit

## 2025-08-23 ENCOUNTER — Ambulatory Visit: Admitting: Adult Health
